# Patient Record
Sex: Female | Born: 1970 | Race: Black or African American | Hispanic: No | Marital: Married | Smoking: Never smoker
Health system: Southern US, Community
[De-identification: ages and names within clinical notes are randomized; demographics above are authoritative.]

## PROBLEM LIST (undated history)

## (undated) DIAGNOSIS — I10 Essential (primary) hypertension: Secondary | ICD-10-CM

## (undated) DIAGNOSIS — F419 Anxiety disorder, unspecified: Secondary | ICD-10-CM

## (undated) DIAGNOSIS — F411 Generalized anxiety disorder: Secondary | ICD-10-CM

## (undated) DIAGNOSIS — I6523 Occlusion and stenosis of bilateral carotid arteries: Secondary | ICD-10-CM

## (undated) DIAGNOSIS — E785 Hyperlipidemia, unspecified: Secondary | ICD-10-CM

## (undated) DIAGNOSIS — Z7901 Long term (current) use of anticoagulants: Secondary | ICD-10-CM

## (undated) DIAGNOSIS — Z9889 Other specified postprocedural states: Secondary | ICD-10-CM

## (undated) DIAGNOSIS — N939 Abnormal uterine and vaginal bleeding, unspecified: Secondary | ICD-10-CM

## (undated) DIAGNOSIS — K219 Gastro-esophageal reflux disease without esophagitis: Secondary | ICD-10-CM

## (undated) DIAGNOSIS — I675 Moyamoya disease: Secondary | ICD-10-CM

## (undated) DIAGNOSIS — D500 Iron deficiency anemia secondary to blood loss (chronic): Secondary | ICD-10-CM

## (undated) DIAGNOSIS — I639 Cerebral infarction, unspecified: Secondary | ICD-10-CM

## (undated) HISTORY — DX: Cerebral infarction, unspecified: I63.9

---

## 2018-10-02 DIAGNOSIS — H269 Unspecified cataract: Secondary | ICD-10-CM | POA: Insufficient documentation

## 2019-08-25 DIAGNOSIS — N951 Menopausal and female climacteric states: Secondary | ICD-10-CM | POA: Insufficient documentation

## 2020-02-05 ENCOUNTER — Inpatient Hospital Stay (HOSPITAL_COMMUNITY)
Admission: EM | Admit: 2020-02-05 | Discharge: 2020-02-08 | DRG: 065 | Disposition: A | Discharge status: Home / Self Care | Payer: BC Managed Care – PPO | Attending: Internal Medicine | Admitting: Internal Medicine

## 2020-02-05 ENCOUNTER — Emergency Department (HOSPITAL_COMMUNITY): Payer: BC Managed Care – PPO

## 2020-02-05 ENCOUNTER — Inpatient Hospital Stay (HOSPITAL_COMMUNITY): Payer: BC Managed Care – PPO

## 2020-02-05 ENCOUNTER — Encounter (HOSPITAL_COMMUNITY): Payer: Self-pay | Admitting: Emergency Medicine

## 2020-02-05 ENCOUNTER — Other Ambulatory Visit: Payer: Self-pay

## 2020-02-05 DIAGNOSIS — Z79899 Other long term (current) drug therapy: Secondary | ICD-10-CM | POA: Diagnosis not present

## 2020-02-05 DIAGNOSIS — F419 Anxiety disorder, unspecified: Secondary | ICD-10-CM | POA: Diagnosis present

## 2020-02-05 DIAGNOSIS — Z823 Family history of stroke: Secondary | ICD-10-CM

## 2020-02-05 DIAGNOSIS — R202 Paresthesia of skin: Secondary | ICD-10-CM

## 2020-02-05 DIAGNOSIS — I672 Cerebral atherosclerosis: Secondary | ICD-10-CM | POA: Diagnosis present

## 2020-02-05 DIAGNOSIS — Z20822 Contact with and (suspected) exposure to covid-19: Secondary | ICD-10-CM | POA: Diagnosis present

## 2020-02-05 DIAGNOSIS — R200 Anesthesia of skin: Secondary | ICD-10-CM | POA: Diagnosis present

## 2020-02-05 DIAGNOSIS — R29898 Other symptoms and signs involving the musculoskeletal system: Secondary | ICD-10-CM

## 2020-02-05 DIAGNOSIS — Z9181 History of falling: Secondary | ICD-10-CM

## 2020-02-05 DIAGNOSIS — Z8673 Personal history of transient ischemic attack (TIA), and cerebral infarction without residual deficits: Secondary | ICD-10-CM

## 2020-02-05 DIAGNOSIS — E871 Hypo-osmolality and hyponatremia: Secondary | ICD-10-CM | POA: Diagnosis present

## 2020-02-05 DIAGNOSIS — R29700 NIHSS score 0: Secondary | ICD-10-CM | POA: Diagnosis present

## 2020-02-05 DIAGNOSIS — I63521 Cerebral infarction due to unspecified occlusion or stenosis of right anterior cerebral artery: Principal | ICD-10-CM | POA: Diagnosis present

## 2020-02-05 DIAGNOSIS — Z833 Family history of diabetes mellitus: Secondary | ICD-10-CM

## 2020-02-05 DIAGNOSIS — I639 Cerebral infarction, unspecified: Secondary | ICD-10-CM | POA: Diagnosis present

## 2020-02-05 DIAGNOSIS — R4701 Aphasia: Secondary | ICD-10-CM | POA: Diagnosis present

## 2020-02-05 DIAGNOSIS — E785 Hyperlipidemia, unspecified: Secondary | ICD-10-CM | POA: Diagnosis present

## 2020-02-05 DIAGNOSIS — Z7982 Long term (current) use of aspirin: Secondary | ICD-10-CM

## 2020-02-05 DIAGNOSIS — I63512 Cerebral infarction due to unspecified occlusion or stenosis of left middle cerebral artery: Secondary | ICD-10-CM | POA: Diagnosis present

## 2020-02-05 DIAGNOSIS — R590 Localized enlarged lymph nodes: Secondary | ICD-10-CM | POA: Diagnosis present

## 2020-02-05 DIAGNOSIS — I63513 Cerebral infarction due to unspecified occlusion or stenosis of bilateral middle cerebral arteries: Secondary | ICD-10-CM | POA: Diagnosis not present

## 2020-02-05 DIAGNOSIS — I675 Moyamoya disease: Secondary | ICD-10-CM | POA: Diagnosis present

## 2020-02-05 DIAGNOSIS — I63423 Cerebral infarction due to embolism of bilateral anterior cerebral arteries: Secondary | ICD-10-CM | POA: Diagnosis not present

## 2020-02-05 DIAGNOSIS — Z793 Long term (current) use of hormonal contraceptives: Secondary | ICD-10-CM | POA: Diagnosis not present

## 2020-02-05 DIAGNOSIS — G8191 Hemiplegia, unspecified affecting right dominant side: Secondary | ICD-10-CM | POA: Diagnosis present

## 2020-02-05 DIAGNOSIS — I6389 Other cerebral infarction: Secondary | ICD-10-CM | POA: Diagnosis not present

## 2020-02-05 DIAGNOSIS — M25551 Pain in right hip: Secondary | ICD-10-CM | POA: Diagnosis present

## 2020-02-05 DIAGNOSIS — I63232 Cerebral infarction due to unspecified occlusion or stenosis of left carotid arteries: Secondary | ICD-10-CM | POA: Diagnosis not present

## 2020-02-05 DIAGNOSIS — I10 Essential (primary) hypertension: Secondary | ICD-10-CM | POA: Diagnosis present

## 2020-02-05 DIAGNOSIS — R531 Weakness: Secondary | ICD-10-CM | POA: Diagnosis present

## 2020-02-05 DIAGNOSIS — R002 Palpitations: Secondary | ICD-10-CM | POA: Diagnosis present

## 2020-02-05 HISTORY — DX: Personal history of transient ischemic attack (TIA), and cerebral infarction without residual deficits: Z86.73

## 2020-02-05 HISTORY — DX: Essential (primary) hypertension: I10

## 2020-02-05 LAB — COMPREHENSIVE METABOLIC PANEL
ALT: 18 U/L (ref 0–44)
AST: 17 U/L (ref 15–41)
Albumin: 3.2 g/dL — ABNORMAL LOW (ref 3.5–5.0)
Alkaline Phosphatase: 84 U/L (ref 38–126)
Anion gap: 9 (ref 5–15)
BUN: 18 mg/dL (ref 6–20)
CO2: 27 mmol/L (ref 22–32)
Calcium: 8.5 mg/dL — ABNORMAL LOW (ref 8.9–10.3)
Chloride: 97 mmol/L — ABNORMAL LOW (ref 98–111)
Creatinine, Ser: 1.12 mg/dL — ABNORMAL HIGH (ref 0.44–1.00)
GFR calc Af Amer: >60 mL/min (ref >60)
GFR calc non Af Amer: 57 mL/min — ABNORMAL LOW (ref >60)
Glucose, Bld: 89 mg/dL (ref 70–99)
Potassium: 4.4 mmol/L (ref 3.5–5.1)
Sodium: 133 mmol/L — ABNORMAL LOW (ref 135–145)
Total Bilirubin: 1 mg/dL (ref 0.3–1.2)
Total Protein: 6.6 g/dL (ref 6.5–8.1)

## 2020-02-05 LAB — I-STAT CHEM 8, ED
BUN: 21 mg/dL — ABNORMAL HIGH (ref 6–20)
Calcium, Ion: 1.09 mmol/L — ABNORMAL LOW (ref 1.15–1.40)
Chloride: 99 mmol/L (ref 98–111)
Creatinine, Ser: 1.1 mg/dL — ABNORMAL HIGH (ref 0.44–1.00)
Glucose, Bld: 83 mg/dL (ref 70–99)
HCT: 43 % (ref 36.0–46.0)
Hemoglobin: 14.6 g/dL (ref 12.0–15.0)
Potassium: 4.4 mmol/L (ref 3.5–5.1)
Sodium: 136 mmol/L (ref 135–145)
TCO2: 30 mmol/L (ref 22–32)

## 2020-02-05 LAB — CBC
HCT: 42.9 % (ref 36.0–46.0)
Hemoglobin: 14.5 g/dL (ref 12.0–15.0)
MCH: 30.9 pg (ref 26.0–34.0)
MCHC: 33.8 g/dL (ref 30.0–36.0)
MCV: 91.3 fL (ref 80.0–100.0)
Platelets: 301 10*3/uL (ref 150–400)
RBC: 4.7 MIL/uL (ref 3.87–5.11)
RDW: 13.5 % (ref 11.5–15.5)
WBC: 10 10*3/uL (ref 4.0–10.5)
nRBC: 0 % (ref 0.0–0.2)

## 2020-02-05 LAB — DIFFERENTIAL
Abs Immature Granulocytes: 0.04 10*3/uL (ref 0.00–0.07)
Basophils Absolute: 0 10*3/uL (ref 0.0–0.1)
Basophils Relative: 0 %
Eosinophils Absolute: 0.2 10*3/uL (ref 0.0–0.5)
Eosinophils Relative: 2 %
Immature Granulocytes: 0 %
Lymphocytes Relative: 19 %
Lymphs Abs: 1.9 10*3/uL (ref 0.7–4.0)
Monocytes Absolute: 0.8 10*3/uL (ref 0.1–1.0)
Monocytes Relative: 8 %
Neutro Abs: 7 10*3/uL (ref 1.7–7.7)
Neutrophils Relative %: 71 %

## 2020-02-05 LAB — APTT: aPTT: 26 seconds (ref 24–36)

## 2020-02-05 LAB — I-STAT BETA HCG BLOOD, ED (MC, WL, AP ONLY): I-stat hCG, quantitative: <5 m[IU]/mL (ref <5)

## 2020-02-05 LAB — PROTIME-INR
INR: 1 (ref 0.8–1.2)
Prothrombin Time: 12.5 seconds (ref 11.4–15.2)

## 2020-02-05 IMAGING — CR DG HIP (WITH OR WITHOUT PELVIS) 2-3V*R*
3 series · 3 of 3 positions shown · non-contrast
Comparison: None.

CLINICAL DATA: Fall 1 week ago with persistent right hip pain,
initial encounter

EXAM:
DG HIP (WITH OR WITHOUT PELVIS) 2-3V RIGHT

[pelvis ap]
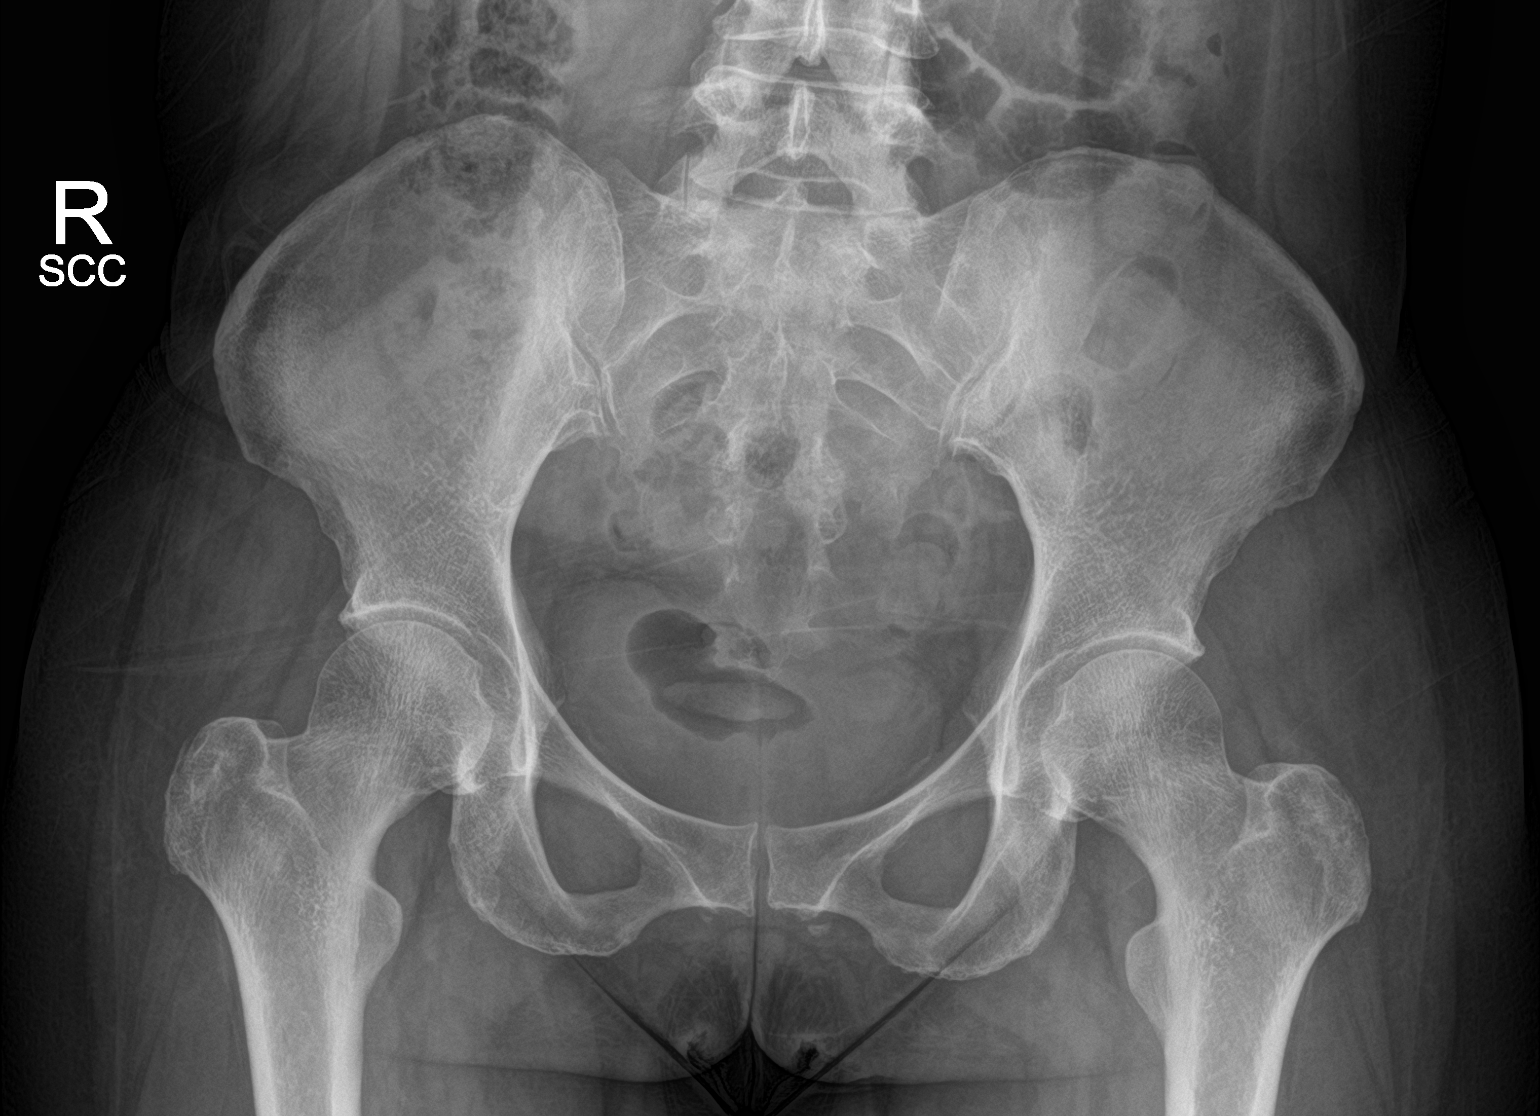

[hip ap]
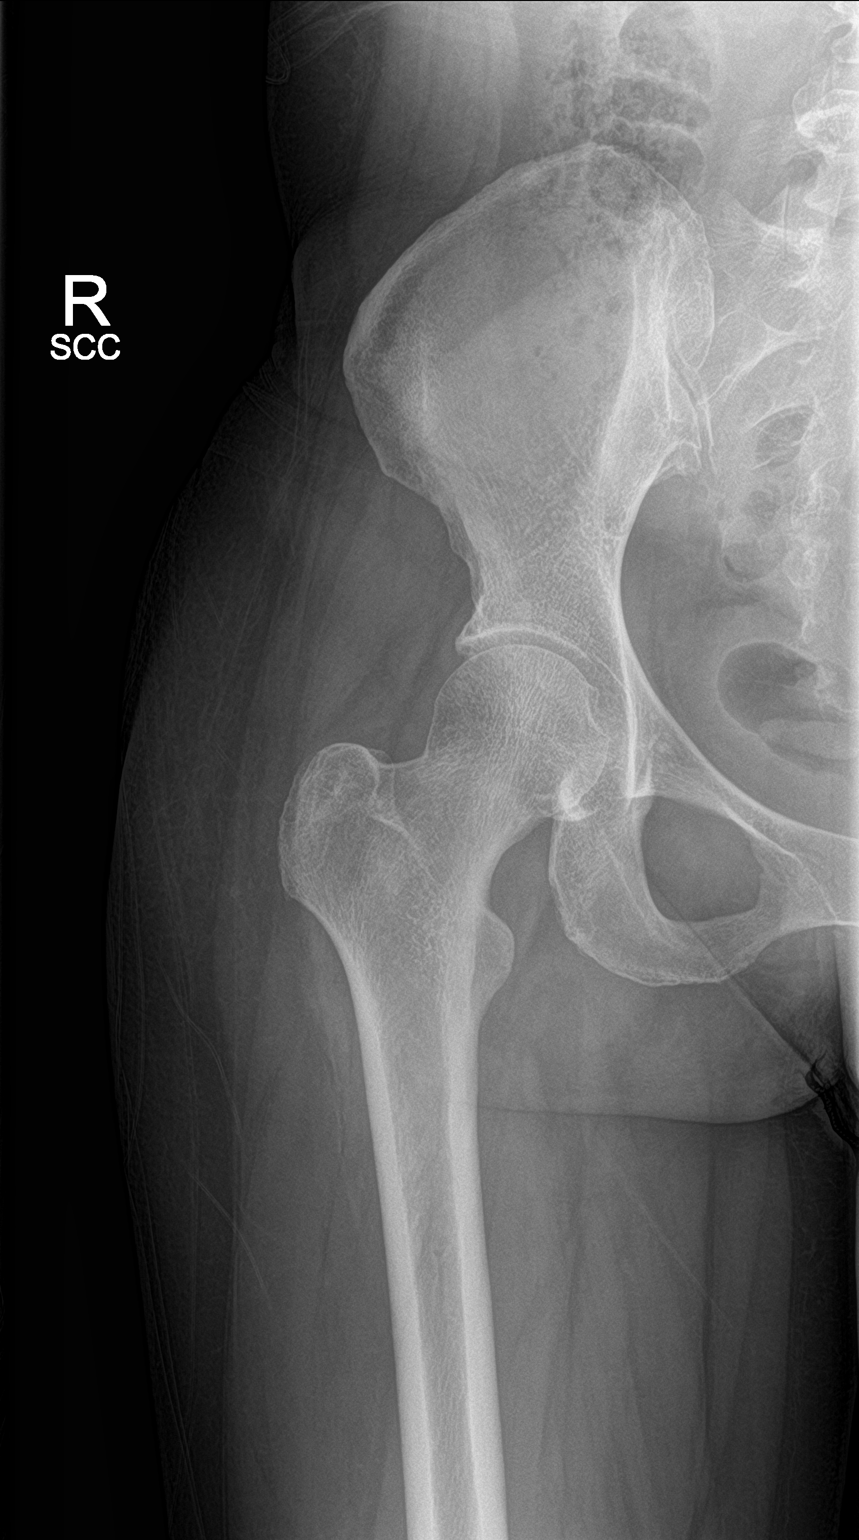

[hip lat]
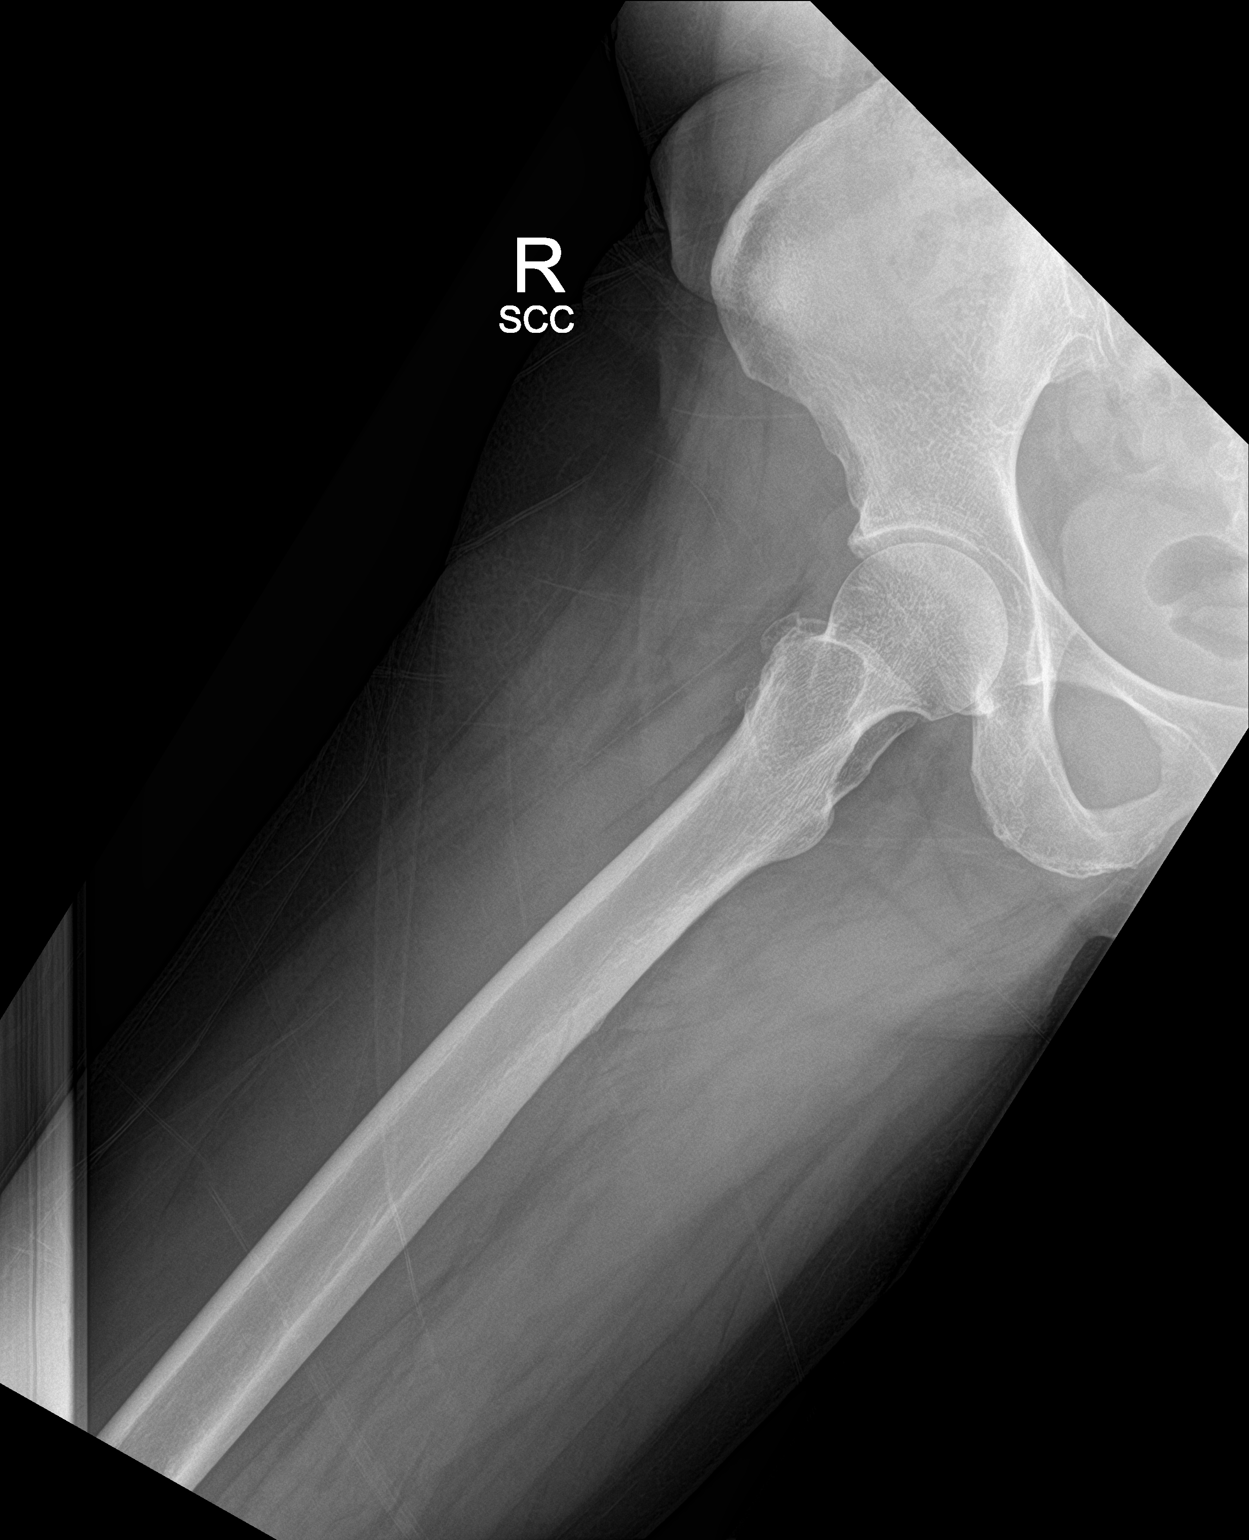

[3 of 3 positions shown; findings below may reference images not displayed]

FINDINGS: Pelvic ring is intact. Mild degenerative changes of the hip joints
are noted bilaterally. No acute fracture or dislocation is seen. No
soft tissue abnormality is noted.
IMPRESSION: Degenerative change without acute abnormality.

## 2020-02-05 IMAGING — MR MR HEAD W/O CM
9 of 13 series · 26 of 48 positions shown · non-contrast
Comparison: Head CT [DATE]

CLINICAL DATA: Mild aphasia. Transient right arm and leg weakness
and left face numbness.

EXAM:
MRI HEAD WITHOUT CONTRAST
MRA HEAD WITHOUT CONTRAST
TECHNIQUE: Multiplanar, multiecho pulse sequences of the brain and surrounding
structures were obtained without intravenous contrast. Angiographic
images of the head were obtained using MRA technique without
contrast.

[Series 4: DWI · axial · 3.0mm · 0.94mm/px · z∈[-135,+10]mm · 6 of 104 slices shown (1 of 2)]
[im 1/104]
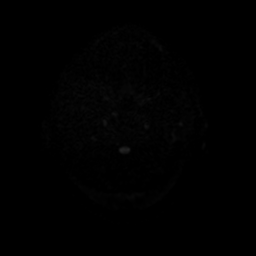
[im 21/104]
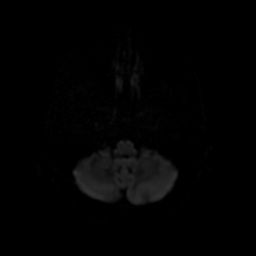
[im 42/104]
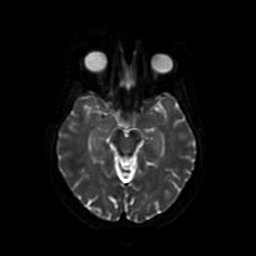
[im 62/104]
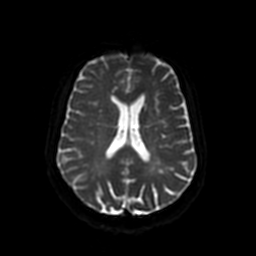
[im 83/104]
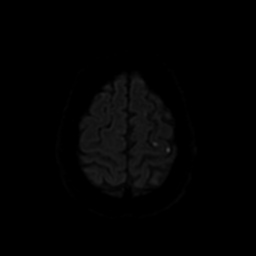
[im 104/104]
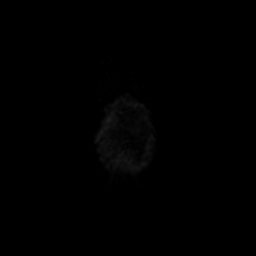

[Series 5: FLAIR · sagittal · 5.0mm · 0.23mm/px · 1 of 24 slices shown (1 of 2)]
[im 1/24]
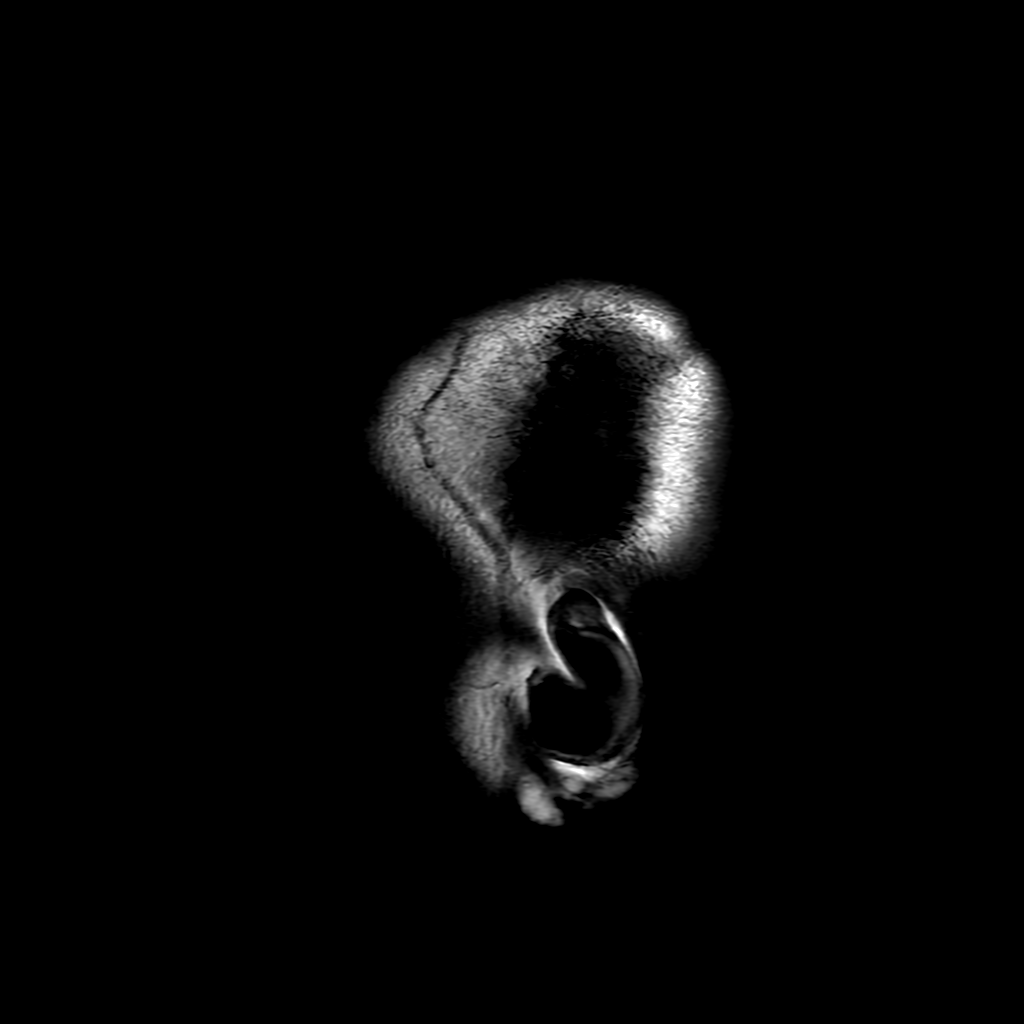

[Series 6: ax (id) · axial · 1.0mm · 0.43mm/px · z∈[-119,-46]mm · 6 of 206 slices shown]
[im 1/206]
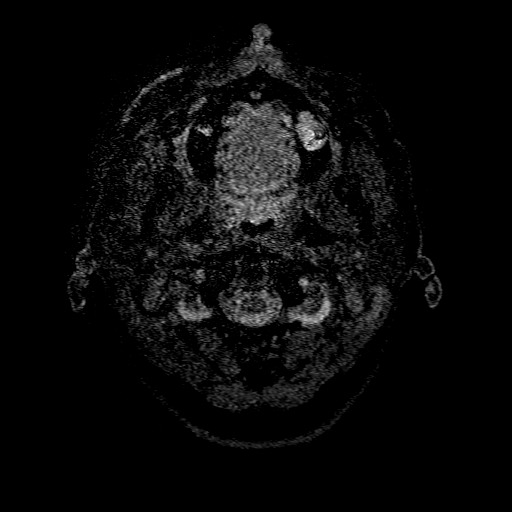
[im 38/206]
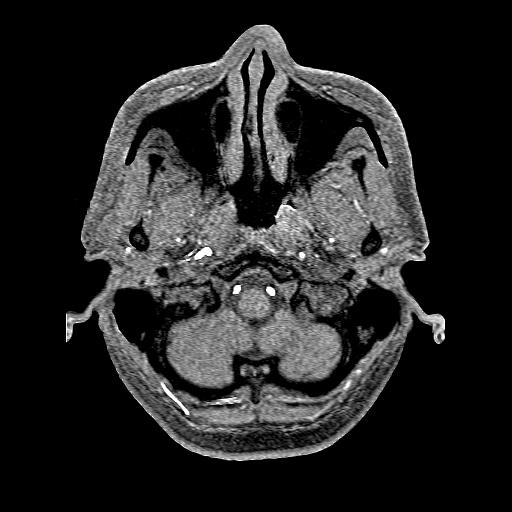
[im 56/206]
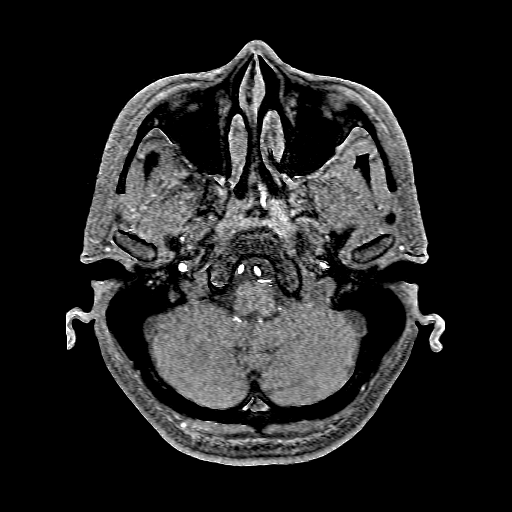
[im 94/206]
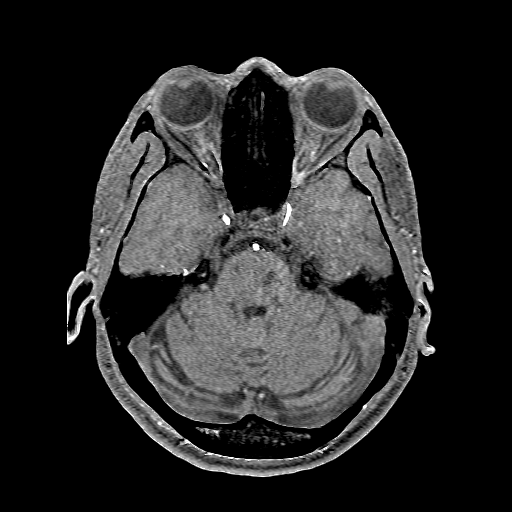
[im 112/206]
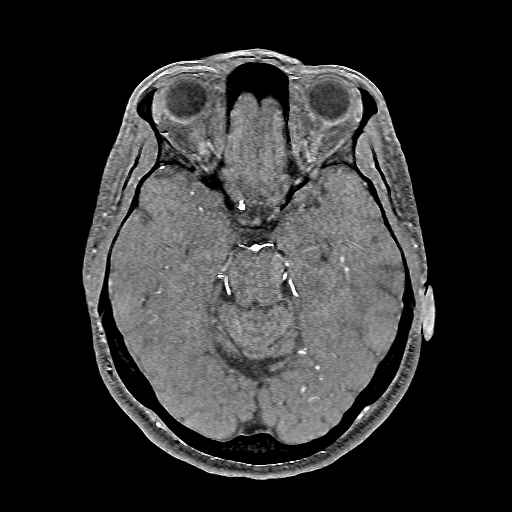
[im 150/206]
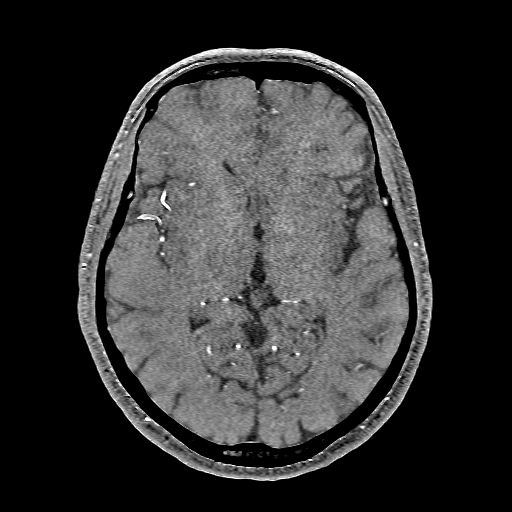

[Series 7: FLAIR · axial · 3.0mm · 0.41mm/px · 1 of 26 slices shown (2 of 2)]
[im 1/26]
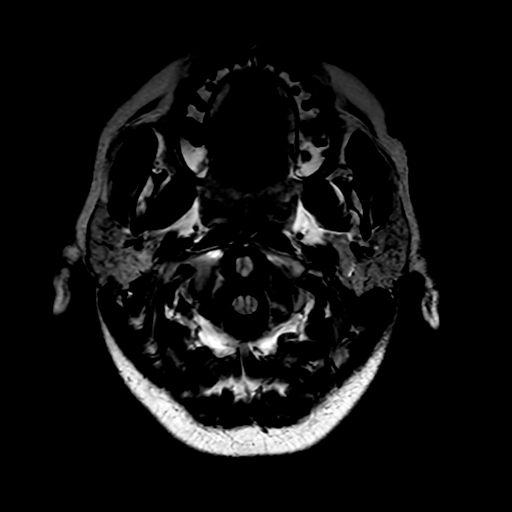

[Series 9: DWI · coronal · 4.0mm · 0.94mm/px · 4 of 70 slices shown (2 of 2)]
[im 1/70]
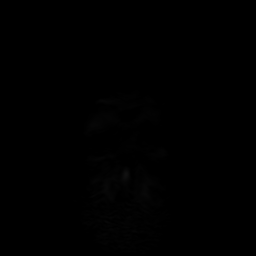
[im 24/70]
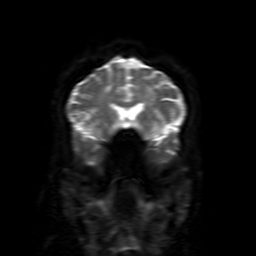
[im 47/70]
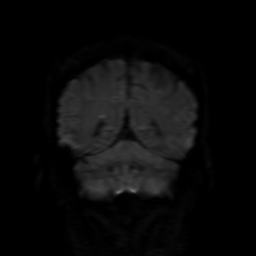
[im 70/70]
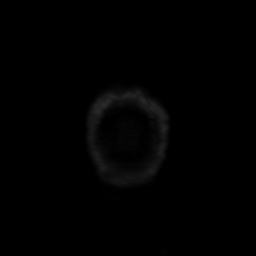

[Series 10: T2 · axial · 5.0mm · 0.47mm/px · 1 of 26 slices shown (1 of 2)]
[im 1/26]
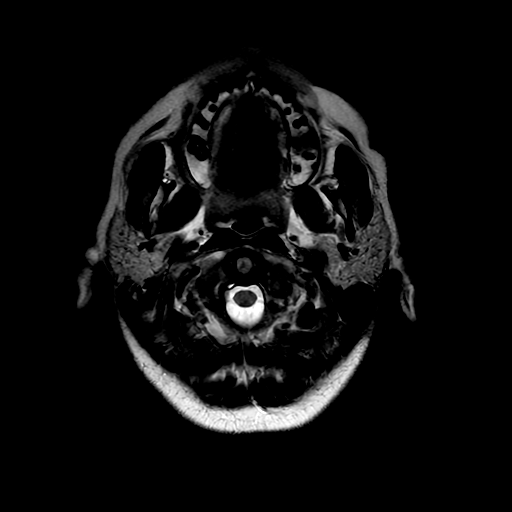

[Series 12: T2 · coronal · 5.0mm · 0.39mm/px · 2 of 29 slices shown (2 of 2)]
[im 1/29]
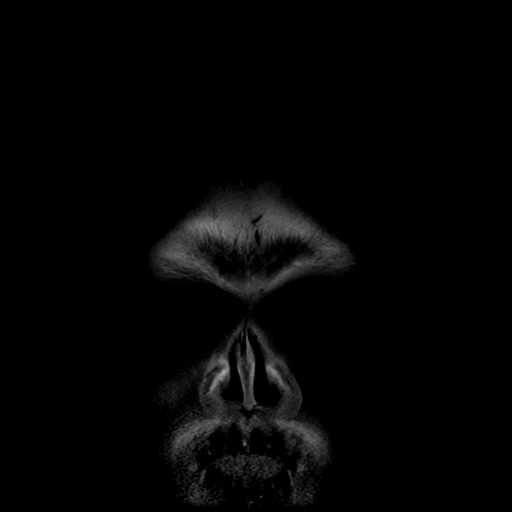
[im 29/29]
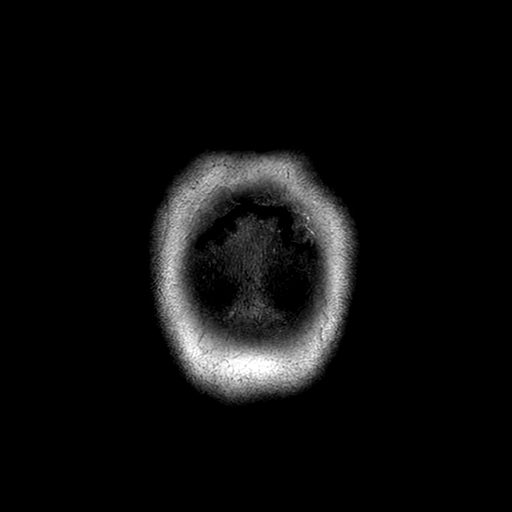

[Series 450: ADC · axial · 3.0mm · 0.94mm/px · z∈[-135,+10]mm · 3 of 52 slices shown (1 of 2)]
[im 1/52]
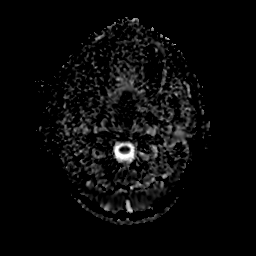
[im 26/52]
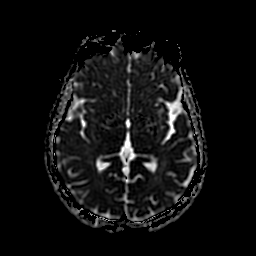
[im 52/52]
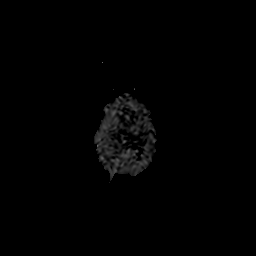

[Series 950: ADC · coronal · 4.0mm · 0.94mm/px · 2 of 35 slices shown (2 of 2)]
[im 1/35]
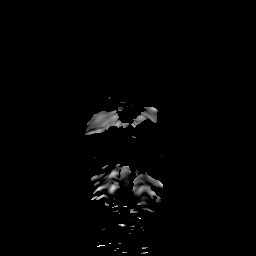
[im 35/35]
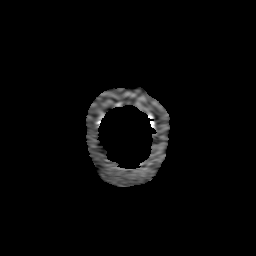

[26 of 48 positions shown; findings below may reference images not displayed]

FINDINGS: MRI HEAD FINDINGS

Brain: There are multiple punctate acute infarcts involving medial
right frontal lobe cortex and subcortical white matter (ACA
territory), right periatrial white matter, and left precentral
gyrus. Additionally, there is a more confluent region of milder
diffusion weighted signal abnormality in the left frontal lobe
involving cortex and white matter measuring approximately 3 cm in
size with T2 hyperintensity, facilitated diffusion, and petechial
hemorrhage compatible with a subacute infarct and corresponding to
the abnormality on CT.

T2 hyperintensities elsewhere in the cerebral white matter
bilaterally are nonspecific but compatible with moderately age
advanced chronic small vessel ischemic disease. There is also a late
subacute to chronic left basal ganglia infarct with chronic blood
products. The ventricles and sulci are normal. No mass, midline
shift, or extra-axial fluid collection is identified.

Vascular: Abnormal appearance of the left ICA, more fully evaluated
below.

Skull and upper cervical spine: Unremarkable bone marrow signal.

Sinuses/Orbits: Unremarkable orbits. Paranasal sinuses and mastoid
air cells are clear.

Other: None.

MRA HEAD FINDINGS

The visualized distal to the basilar and codominant vertebral
arteries are widely patent. Patent PICAs, AICAs, and SCAs are seen
bilaterally. The basilar artery is widely patent. There are medium
sized posterior communicating arteries bilaterally with diminished
or absent flow related enhancement proximally on the left. The PCAs
are patent without evidence of significant proximal stenosis.

The intracranial right internal carotid artery is patent with severe
tandem stenosis involving the distal supraclinoid segment and
terminus. The intracranial left ICA is patent proximally but small
and is occluded from the proximal supraclinoid segment through the
terminus. The left MCA and both ACAs are occluded. The right MCA is
patent with a severe stenosis at its origin. No aneurysm is
identified.
IMPRESSION: 1. Multiple small acute infarcts in the right ACA territory and left
precentral gyrus.
2. Subacute left frontal infarct.
3. Moderately extensive chronic small vessel ischemic disease.
4. Occlusion of the distal left ICA, left MCA, and both ACAs with
severe tandem stenoses of the distal right ICA (Moyamoya pattern).

## 2020-02-05 IMAGING — MR MR MRA HEAD W/O CM
9 of 13 series · 26 of 48 positions shown · non-contrast
Comparison: Head CT [DATE]

CLINICAL DATA: Mild aphasia. Transient right arm and leg weakness
and left face numbness.

EXAM:
MRI HEAD WITHOUT CONTRAST
MRA HEAD WITHOUT CONTRAST
TECHNIQUE: Multiplanar, multiecho pulse sequences of the brain and surrounding
structures were obtained without intravenous contrast. Angiographic
images of the head were obtained using MRA technique without
contrast.

[Series 4: DWI · axial · 3.0mm · 0.94mm/px · z∈[-135,+10]mm · 6 of 104 slices shown (1 of 2)]
[im 1/104]
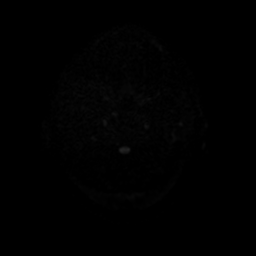
[im 21/104]
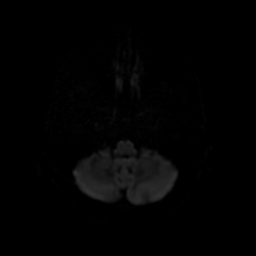
[im 42/104]
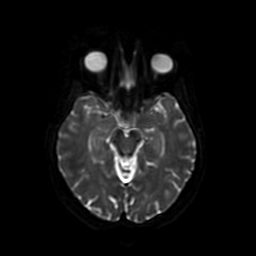
[im 62/104]
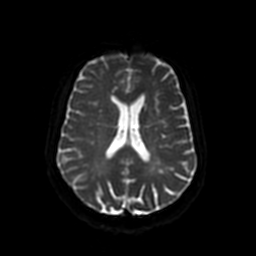
[im 83/104]
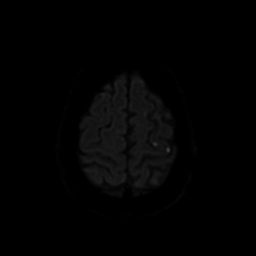
[im 104/104]
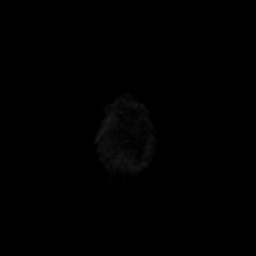

[Series 5: FLAIR · sagittal · 5.0mm · 0.23mm/px · 1 of 24 slices shown (1 of 2)]
[im 1/24]
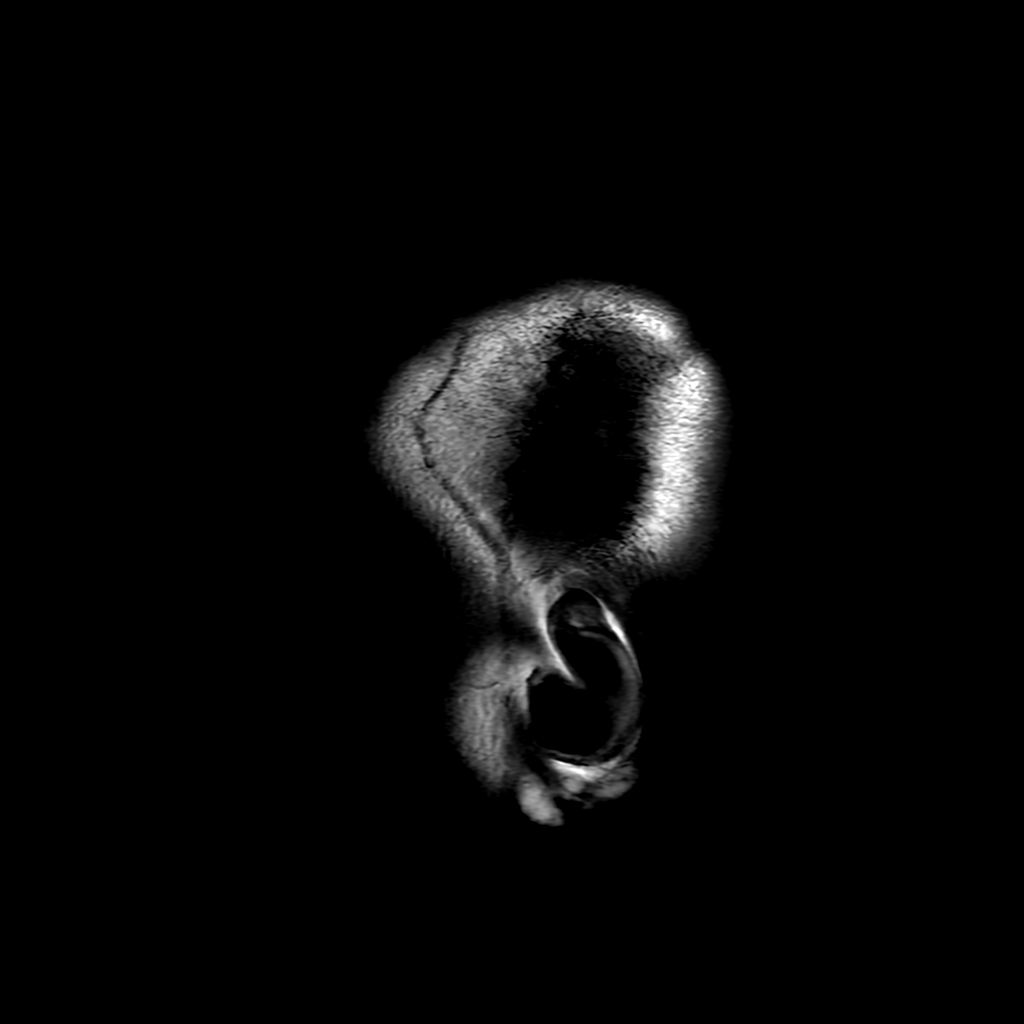

[Series 6: ax (id) · axial · 1.0mm · 0.43mm/px · z∈[-119,-46]mm · 6 of 206 slices shown]
[im 1/206]
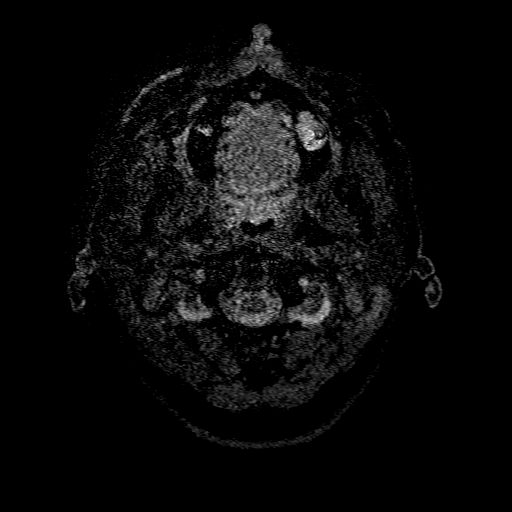
[im 38/206]
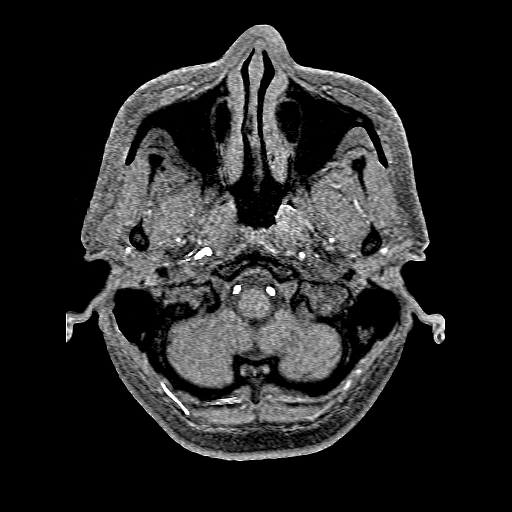
[im 56/206]
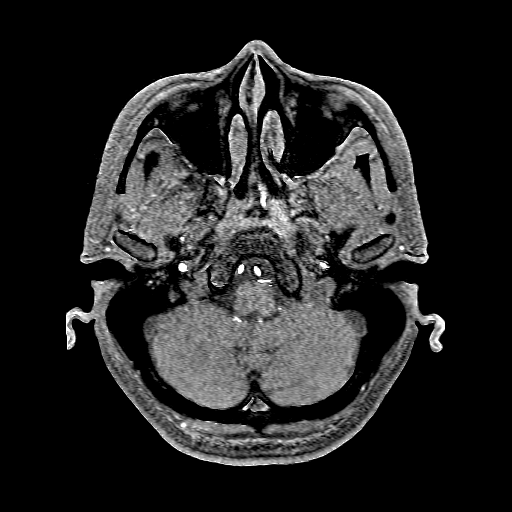
[im 94/206]
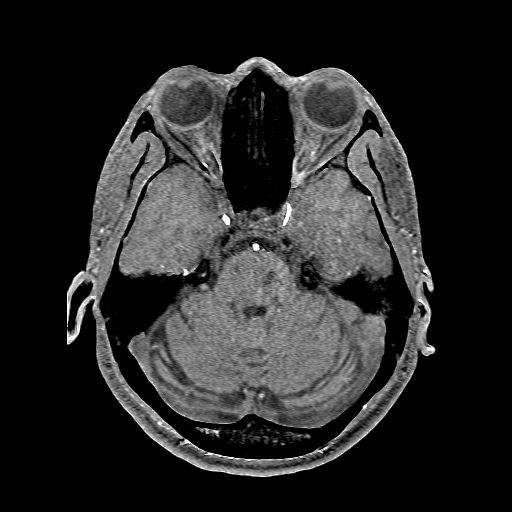
[im 112/206]
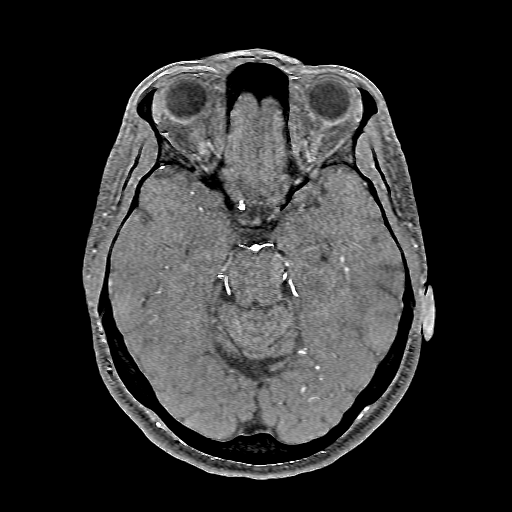
[im 150/206]
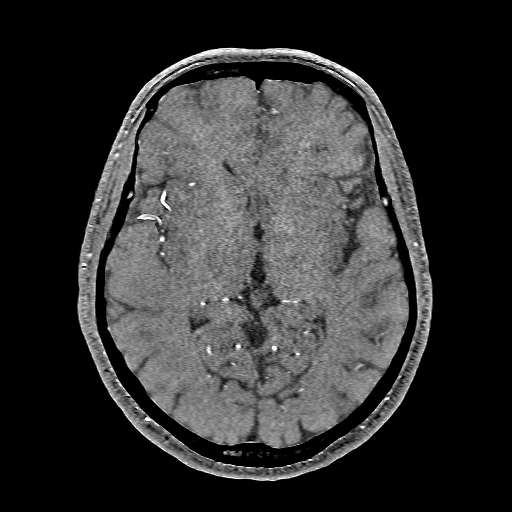

[Series 7: FLAIR · axial · 3.0mm · 0.41mm/px · 1 of 26 slices shown (2 of 2)]
[im 1/26]
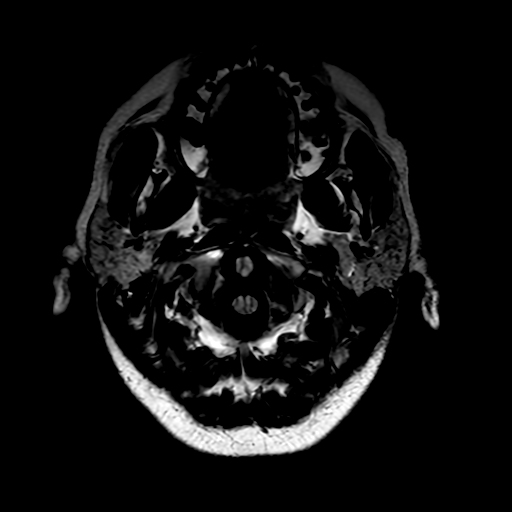

[Series 9: DWI · coronal · 4.0mm · 0.94mm/px · 4 of 70 slices shown (2 of 2)]
[im 1/70]
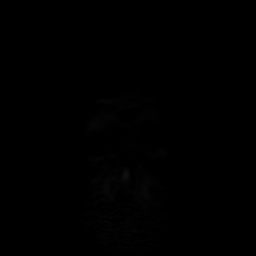
[im 24/70]
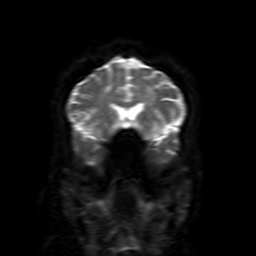
[im 47/70]
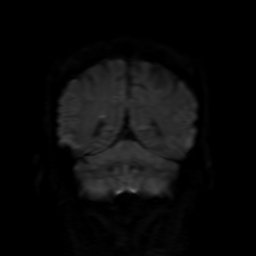
[im 70/70]
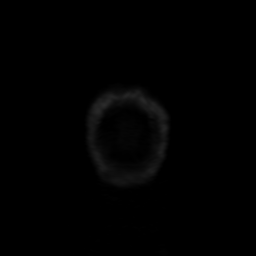

[Series 10: T2 · axial · 5.0mm · 0.47mm/px · 1 of 26 slices shown (1 of 2)]
[im 1/26]
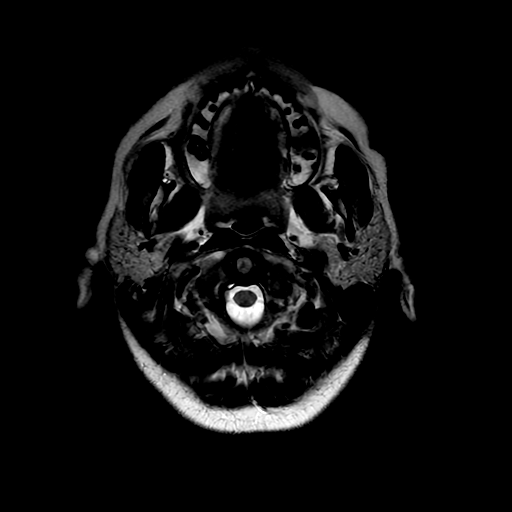

[Series 12: T2 · coronal · 5.0mm · 0.39mm/px · 2 of 29 slices shown (2 of 2)]
[im 1/29]
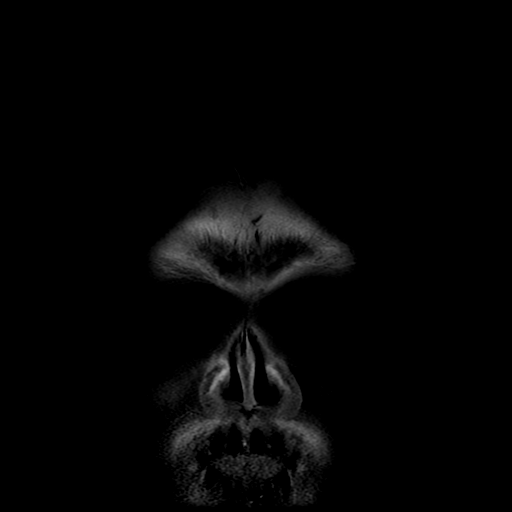
[im 29/29]
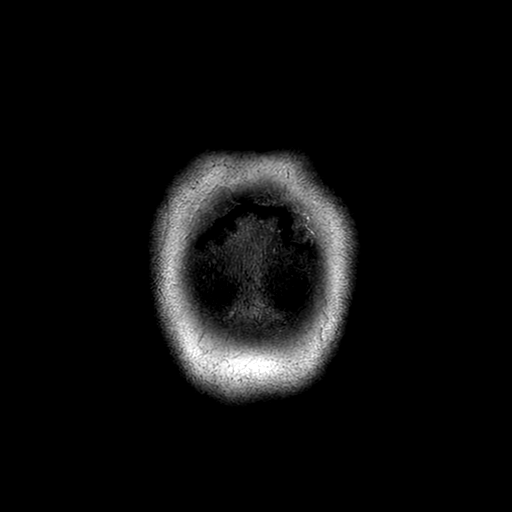

[Series 450: ADC · axial · 3.0mm · 0.94mm/px · z∈[-135,+10]mm · 3 of 52 slices shown (1 of 2)]
[im 1/52]
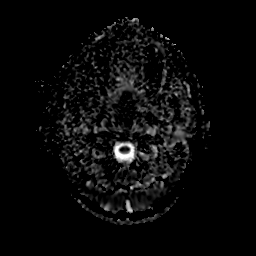
[im 26/52]
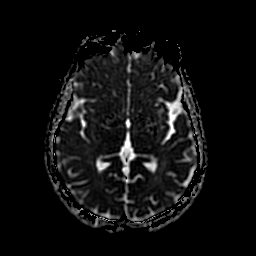
[im 52/52]
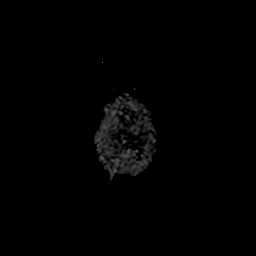

[Series 950: ADC · coronal · 4.0mm · 0.94mm/px · 2 of 35 slices shown (2 of 2)]
[im 1/35]
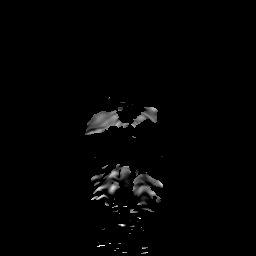
[im 35/35]
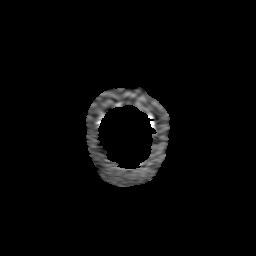

[26 of 48 positions shown; findings below may reference images not displayed]

FINDINGS: MRI HEAD FINDINGS

Brain: There are multiple punctate acute infarcts involving medial
right frontal lobe cortex and subcortical white matter (ACA
territory), right periatrial white matter, and left precentral
gyrus. Additionally, there is a more confluent region of milder
diffusion weighted signal abnormality in the left frontal lobe
involving cortex and white matter measuring approximately 3 cm in
size with T2 hyperintensity, facilitated diffusion, and petechial
hemorrhage compatible with a subacute infarct and corresponding to
the abnormality on CT.

T2 hyperintensities elsewhere in the cerebral white matter
bilaterally are nonspecific but compatible with moderately age
advanced chronic small vessel ischemic disease. There is also a late
subacute to chronic left basal ganglia infarct with chronic blood
products. The ventricles and sulci are normal. No mass, midline
shift, or extra-axial fluid collection is identified.

Vascular: Abnormal appearance of the left ICA, more fully evaluated
below.

Skull and upper cervical spine: Unremarkable bone marrow signal.

Sinuses/Orbits: Unremarkable orbits. Paranasal sinuses and mastoid
air cells are clear.

Other: None.

MRA HEAD FINDINGS

The visualized distal to the basilar and codominant vertebral
arteries are widely patent. Patent PICAs, AICAs, and SCAs are seen
bilaterally. The basilar artery is widely patent. There are medium
sized posterior communicating arteries bilaterally with diminished
or absent flow related enhancement proximally on the left. The PCAs
are patent without evidence of significant proximal stenosis.

The intracranial right internal carotid artery is patent with severe
tandem stenosis involving the distal supraclinoid segment and
terminus. The intracranial left ICA is patent proximally but small
and is occluded from the proximal supraclinoid segment through the
terminus. The left MCA and both ACAs are occluded. The right MCA is
patent with a severe stenosis at its origin. No aneurysm is
identified.
IMPRESSION: 1. Multiple small acute infarcts in the right ACA territory and left
precentral gyrus.
2. Subacute left frontal infarct.
3. Moderately extensive chronic small vessel ischemic disease.
4. Occlusion of the distal left ICA, left MCA, and both ACAs with
severe tandem stenoses of the distal right ICA (Moyamoya pattern).

## 2020-02-05 IMAGING — CT CT HEAD W/O CM
4 series · 16 of 47 positions shown, 18 images · non-contrast
Comparison: None.
COMPARISON: None.

Addendum:
CLINICAL DATA: Right hand weakness for 2 days, initial encounter

EXAM:
CT HEAD WITHOUT CONTRAST
TECHNIQUE: Contiguous axial images were obtained from the base of the skull
through the vertex without intravenous contrast.

[Series 3: head wo · axial · 0.39mm/px · z∈[-140,-30]mm · 7 of 30 slices shown, 9 images]
[im 4/30  brain]
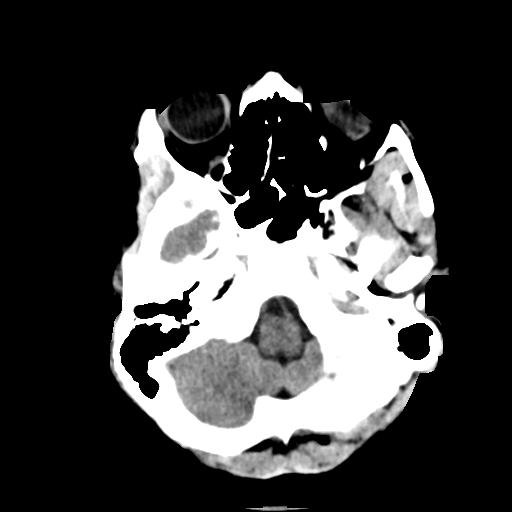
[im 4/30  bone]
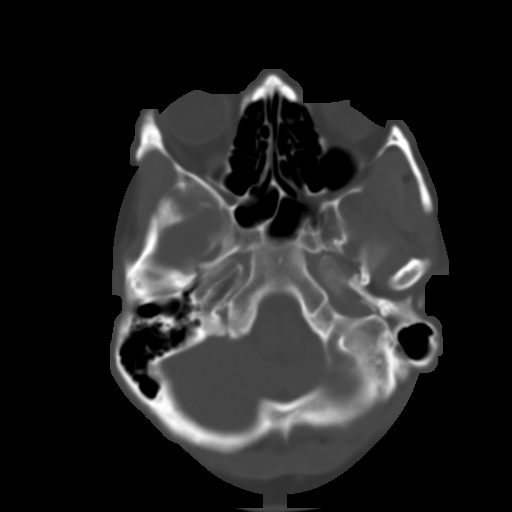
[im 8/30  brain]
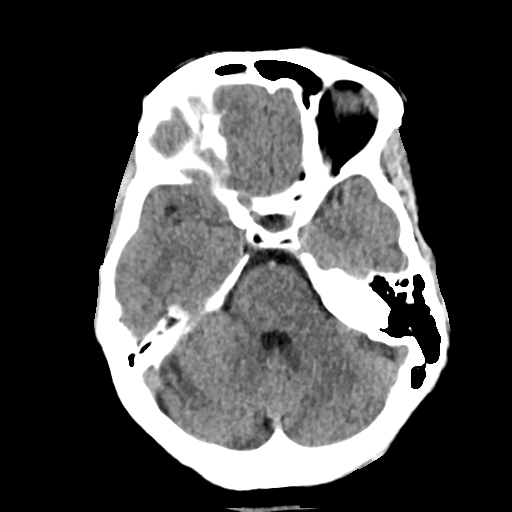
[im 11/30  brain]
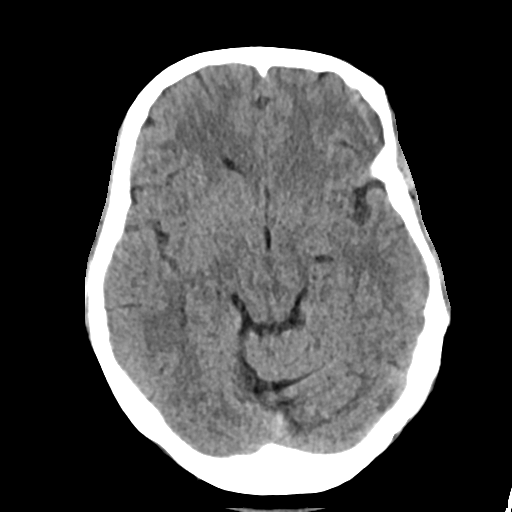
[im 15/30  brain]
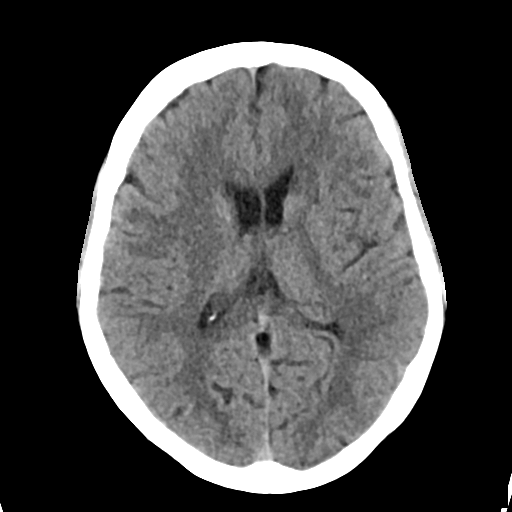
[im 19/30  brain]
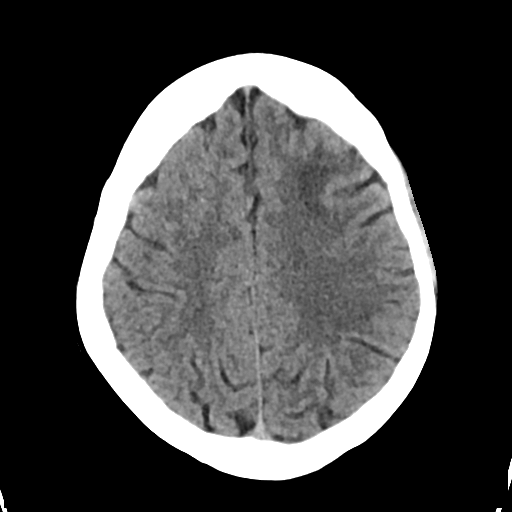
[im 19/30  bone]
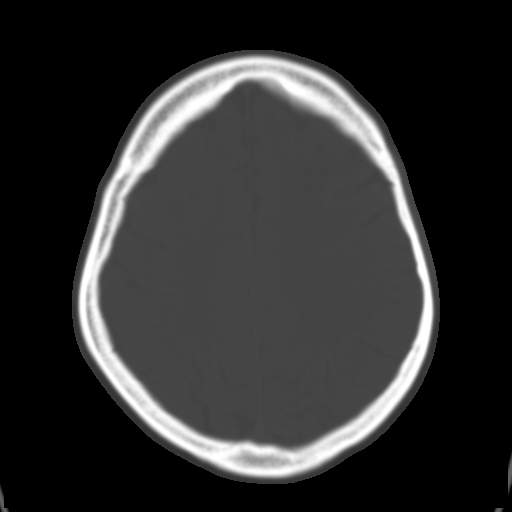
[im 22/30  brain]
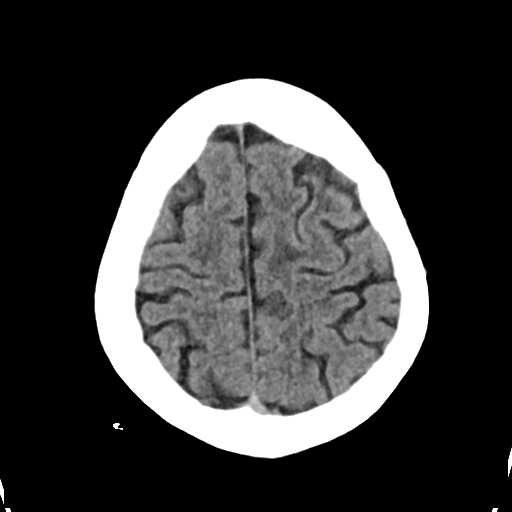
[im 26/30  brain]
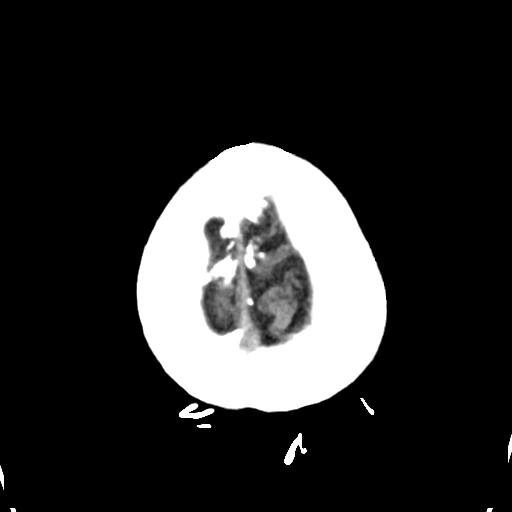

[Series 4: head bone · axial · 0.39mm/px · z∈[-142,-114]mm · 3 of 73 slices shown]
[im 8/73  bone]
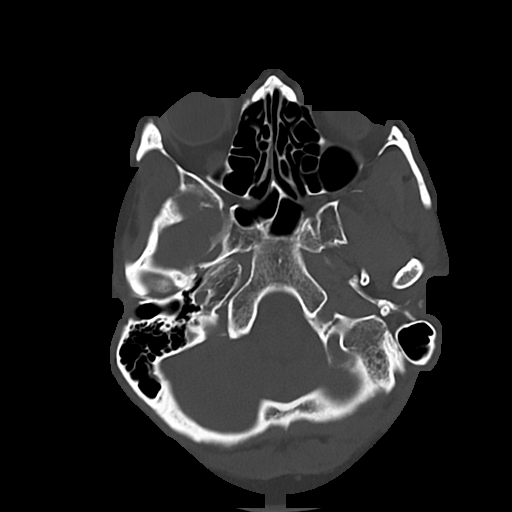
[im 15/73  bone]
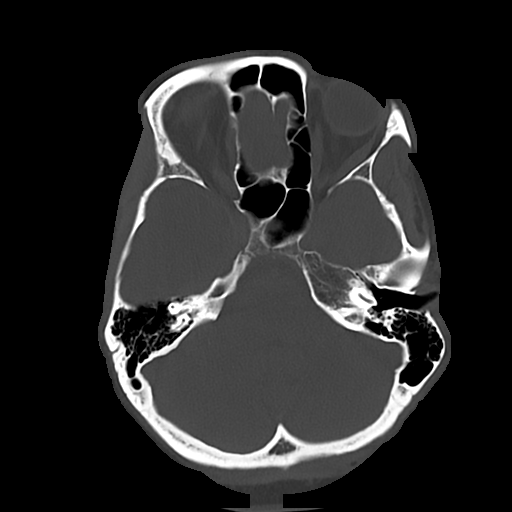
[im 22/73  bone]
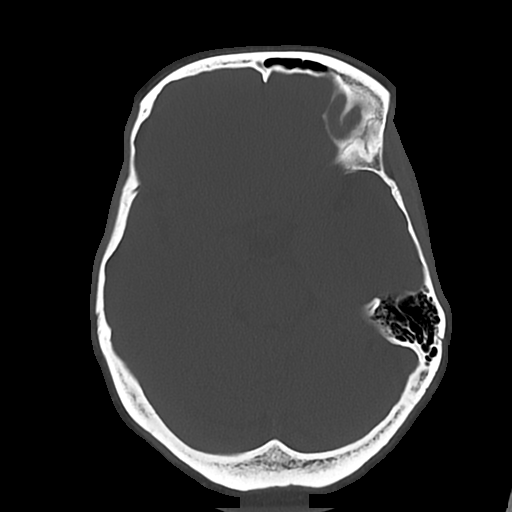

[Series 5: cor soft · coronal · 0.31mm/px · 3 of 62 slices shown]
[im 21/62  brain]
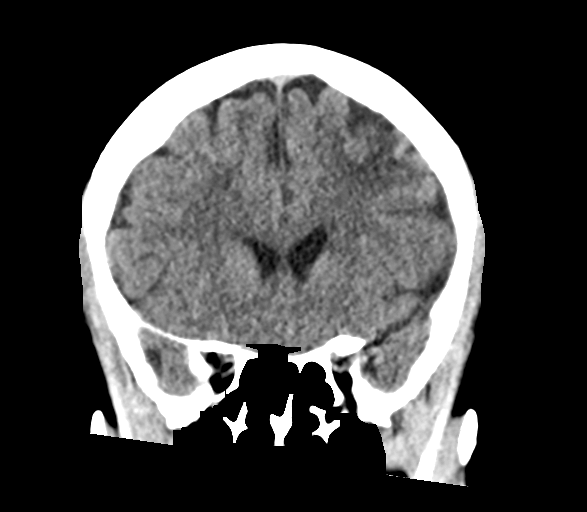
[im 28/62  brain]
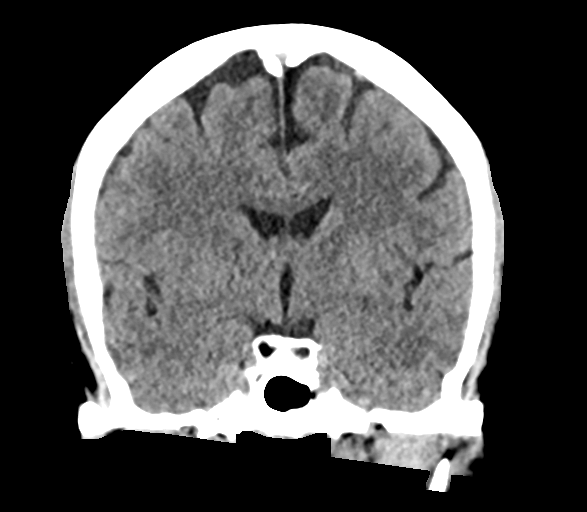
[im 34/62  brain]
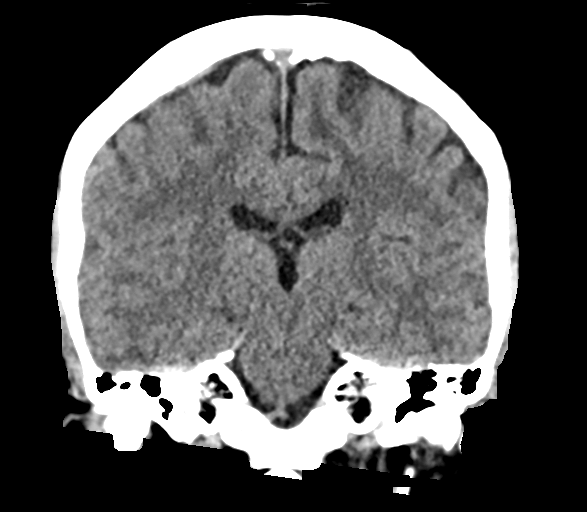

[Series 6: sag soft · sagittal · 0.31mm/px · 3 of 60 slices shown]
[im 22/60  brain]
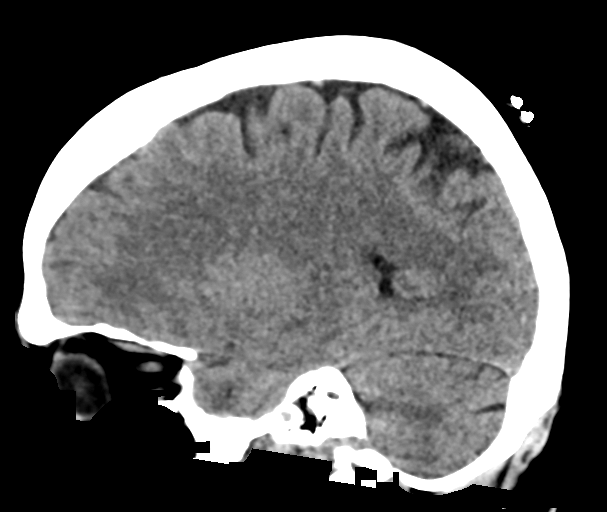
[im 30/60  brain]
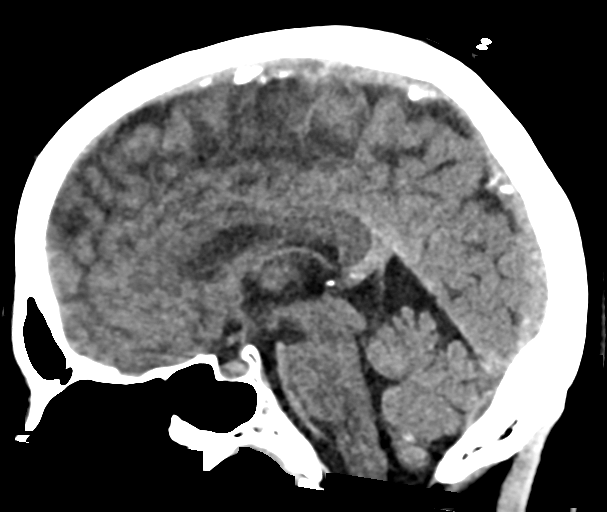
[im 38/60  brain]
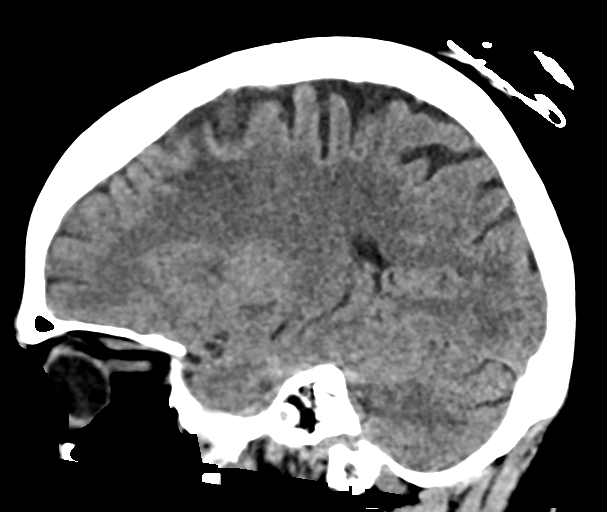

[16 of 47 positions shown; findings below may reference images not displayed]

FINDINGS: Brain: There is an area of geographic decreased attenuation
identified in the right frontal lobe near the vertex consistent with
subacute ischemia. No other focal infarct is seen. No hemorrhage or
mass lesion is noted.

Vascular: No hyperdense vessel or unexpected calcification.

Skull: Normal. Negative for fracture or focal lesion.

Sinuses/Orbits: No acute finding.

Other: None.
IMPRESSION: Area of decreased attenuation in the right frontal lobe consistent
with subacute ischemia.

No other focal abnormality is noted.

ADDENDUM:
The findings and impression should read left frontal lobe not right.

*** End of Addendum ***
FINDINGS: Brain: There is an area of geographic decreased attenuation
identified in the right frontal lobe near the vertex consistent with
subacute ischemia. No other focal infarct is seen. No hemorrhage or
mass lesion is noted.

Vascular: No hyperdense vessel or unexpected calcification.

Skull: Normal. Negative for fracture or focal lesion.

Sinuses/Orbits: No acute finding.

Other: None.
IMPRESSION: Area of decreased attenuation in the right frontal lobe consistent
with subacute ischemia.

No other focal abnormality is noted.

## 2020-02-05 MED ORDER — SODIUM CHLORIDE 0.9 % IV SOLN
INTRAVENOUS | Status: DC | PRN
  Administered 2020-02-05: 250 mL via INTRAVENOUS

## 2020-02-05 MED ORDER — SODIUM CHLORIDE 0.9% FLUSH: 3.0000 mL | Freq: Once | INTRAVENOUS | Status: DC

## 2020-02-05 MED ORDER — ACETAMINOPHEN 160 MG/5ML PO SOLN: 650.0000 mg | ORAL | Status: DC | PRN

## 2020-02-05 MED ORDER — CYCLOBENZAPRINE HCL 10 MG PO TABS: 10.0000 mg | ORAL_TABLET | Freq: Three times a day (TID) | ORAL | Status: DC | PRN

## 2020-02-05 MED ORDER — ASPIRIN 325 MG PO TABS
325.0000 mg | ORAL_TABLET | Freq: Every day | ORAL | Status: DC
  Administered 2020-02-05 – 2020-02-08 (×4): 325 mg via ORAL
  Filled 2020-02-05 (×4): qty 1

## 2020-02-05 MED ORDER — SENNOSIDES-DOCUSATE SODIUM 8.6-50 MG PO TABS: 1.0000 | ORAL_TABLET | Freq: Every evening | ORAL | Status: DC | PRN

## 2020-02-05 MED ORDER — ACETAMINOPHEN 325 MG PO TABS
650.0000 mg | ORAL_TABLET | ORAL | Status: DC | PRN
  Administered 2020-02-07 – 2020-02-08 (×2): 650 mg via ORAL
  Filled 2020-02-05 (×2): qty 2

## 2020-02-05 MED ORDER — ACETAMINOPHEN 650 MG RE SUPP: 650.0000 mg | RECTAL | Status: DC | PRN

## 2020-02-05 MED ORDER — CALCIUM GLUCONATE-NACL 1-0.675 GM/50ML-% IV SOLN
1.0000 g | Freq: Once | INTRAVENOUS | Status: AC
  Administered 2020-02-05: 1000 mg via INTRAVENOUS
  Filled 2020-02-05: qty 50

## 2020-02-05 MED ORDER — SODIUM CHLORIDE 0.9 % IV SOLN: Freq: Once | INTRAVENOUS | Status: DC

## 2020-02-05 MED ORDER — HYDRALAZINE HCL 20 MG/ML IJ SOLN
10.0000 mg | INTRAMUSCULAR | Status: DC | PRN
  Administered 2020-02-07: 10 mg via INTRAVENOUS
  Filled 2020-02-05: qty 1

## 2020-02-05 MED ORDER — ENOXAPARIN SODIUM 40 MG/0.4ML ~~LOC~~ SOLN
40.0000 mg | SUBCUTANEOUS | Status: DC
  Administered 2020-02-05 – 2020-02-06 (×2): 40 mg via SUBCUTANEOUS
  Filled 2020-02-05 (×2): qty 0.4

## 2020-02-05 MED ORDER — BUSPIRONE HCL 10 MG PO TABS
5.0000 mg | ORAL_TABLET | Freq: Two times a day (BID) | ORAL | Status: DC
  Administered 2020-02-05 – 2020-02-08 (×6): 5 mg via ORAL
  Filled 2020-02-05 (×6): qty 1

## 2020-02-05 MED ORDER — ASPIRIN 300 MG RE SUPP: 300.0000 mg | Freq: Every day | RECTAL | Status: DC

## 2020-02-05 MED ORDER — STROKE: EARLY STAGES OF RECOVERY BOOK: Freq: Once | Status: AC

## 2020-02-05 NOTE — H&P (Addendum)
History and Physical    Jacqueline Medina [id number] DOB: 1971-03-22 DOA: 02/05/2020  Referring MD/NP/PA: Dominica Severin, MD PCP: No primary care provider on file.  Patient coming from: Home  Chief Complaint: Right hand weakness  I have personally briefly reviewed patient's old medical records in [place]   HPI: Jacqueline Medina is a 49 y.o. right-handed female with medical history significant of hypertension who presented with weakness of her right hand starting 2 days ago.  She noticed that she kept dropping things in her right hand and was unable to write.  Patient new what she wanted to say, but was having difficulty getting the words out.  Associated symptoms include intermittent complaints of palpitations and fall about 1 week ago with right hip pain.  She denies having any headache, change in vision, leg weakness, chest pain, nausea, vomiting, diarrhea, or recent sick contacts.  Patient notes that she received her second Covid vaccine last Friday, but denied having any issues after receiving the vaccine.  She reports taking a daily aspirin, but also endorses taking oral contraceptive pills.  ED Course: On admission to the emergency department patient was noted to be afebrile with stable vital signs.  Labs significant for sodium 133, BUN 18, and creatinine 1.12.  CT scan of the brain revealed decreased attenuation in the left frontal lobe consistent with subacute ischemia.  Neurology had been consulted and patient was not a candidate for TPA given possible duration of symptoms.  [facility] called to admit.  Review of Systems  Constitutional: Negative for chills, diaphoresis and fever.  HENT: Negative for ear pain and nosebleeds.   Eyes: Negative for blurred vision and photophobia.  Respiratory: Negative for cough and shortness of breath.   Cardiovascular: Positive for palpitations. Negative for chest pain.  Gastrointestinal: Negative for diarrhea, nausea and vomiting.  Genitourinary:  Negative for dysuria and hematuria.  Musculoskeletal: Positive for falls and joint pain. Negative for back pain.  Skin: Negative for itching and rash.  Neurological: Positive for speech change and focal weakness. Negative for loss of consciousness.  Psychiatric/Behavioral: Negative for memory loss and substance abuse.    Past Medical History:  Diagnosis Date  . Hypertension     History reviewed. No pertinent surgical history.   reports that she has never smoked. She has never used smokeless tobacco. She reports previous alcohol use. She reports that she does not use drugs.  No Known Allergies  Family History  Problem Relation Age of Onset  . Diabetes Father     Prior to Admission medications   Medication Sig Start Date End Date Taking? Authorizing Provider  aspirin EC 81 MG tablet Take 81 mg by mouth daily.   Yes [provider]  busPIRone (BUSPAR) 5 MG tablet Take 5 mg by mouth in the morning and at bedtime.   Yes [provider]  cyclobenzaprine (FLEXERIL) 10 MG tablet Take 10 mg by mouth 3 (three) times daily as needed for muscle spasms.   Yes [provider]  hydrochlorothiazide (HYDRODIURIL) 25 MG tablet Take 25 mg by mouth daily.   Yes [provider]  lisinopril (ZESTRIL) 5 MG tablet Take 5 mg by mouth daily.   Yes [provider]  metoprolol tartrate (LOPRESSOR) 25 MG tablet Take 25 mg by mouth 2 (two) times daily.   Yes [provider]  Multiple Vitamin (MULTIVITAMIN WITH MINERALS) TABS tablet Take 1 tablet by mouth daily.   Yes [provider]  naproxen (NAPROSYN) 500 MG tablet Take  500 mg by mouth daily as needed for moderate pain.   Yes [provider]  Norgestim-Eth Estrad Triphasic (TRI-ESTARYLLA PO) Take 1 tablet by mouth daily.   Yes [provider]  potassium chloride (KLOR-CON) 20 MEQ packet Take 20 mEq by mouth daily.   Yes [provider]    Physical  Exam:  Constitutional: Middle-aged female NAD, calm, comfortable Vitals:   02/05/20 1218  BP: 130/80  Pulse: 69  Resp: 15  Temp: 97.9 F (36.6 C)  TempSrc: Oral  SpO2: 100%   Eyes: PERRL, lids and conjunctivae normal ENMT: Mucous membranes are moist. Posterior pharynx clear of any exudate or lesions.  Neck: normal, supple, no masses, no thyromegaly Respiratory: clear to auscultation bilaterally, no wheezing, no crackles. Normal respiratory effort. No accessory muscle use.  Cardiovascular: Regular rate and rhythm, no murmurs / rubs / gallops. No extremity edema. 2+ pedal pulses. No carotid bruits.  Abdomen: no tenderness, no masses palpated. No hepatosplenomegaly. Bowel sounds positive.  Musculoskeletal: no clubbing / cyanosis. No joint deformity upper and lower extremities. Good ROM, no contractures. Normal muscle tone.  Skin: no rashes, lesions, ulcers. No induration Neurologic: CN 2-12 grossly intact. Sensation intact, DTR normal. Strength 5/5 left upper and lower extremity.  Strength 4+/5 in the right hand and 5/5 in the right leg. Psychiatric: Normal judgment and insight. Alert and oriented x 3. Normal mood.     Labs on Admission: I have personally reviewed following labs and imaging studies  CBC: Recent Labs  Lab 02/05/20 1228 02/05/20 1259  WBC 10.0  --   NEUTROABS 7.0  --   HGB 14.5 14.6  HCT 42.9 43.0  MCV 91.3  --   PLT 301  --    Basic Metabolic Panel: Recent Labs  Lab 02/05/20 1228 02/05/20 1259  NA 133* 136  K 4.4 4.4  CL 97* 99  CO2 27  --   GLUCOSE 89 83  BUN 18 21*  CREATININE 1.12* 1.10*  CALCIUM 8.5*  --    GFR: CrCl cannot be calculated (Unknown ideal weight.). Liver Function Tests: Recent Labs  Lab 02/05/20 1228  AST 17  ALT 18  ALKPHOS 84  BILITOT 1.0  PROT 6.6  ALBUMIN 3.2*   No results for input(s): LIPASE, AMYLASE in the last 168 hours. No results for input(s): AMMONIA in the last 168 hours. Coagulation Profile: Recent Labs   Lab 02/05/20 1228  INR 1.0   Cardiac Enzymes: No results for input(s): CKTOTAL, CKMB, CKMBINDEX, TROPONINI in the last 168 hours. BNP (last 3 results) No results for input(s): PROBNP in the last 8760 hours. HbA1C: No results for input(s): HGBA1C in the last 72 hours. CBG: No results for input(s): GLUCAP in the last 168 hours. Lipid Profile: No results for input(s): CHOL, HDL, LDLCALC, TRIG, CHOLHDL, LDLDIRECT in the last 72 hours. Thyroid Function Tests: No results for input(s): TSH, T4TOTAL, FREET4, T3FREE, THYROIDAB in the last 72 hours. Anemia Panel: No results for input(s): VITAMINB12, FOLATE, FERRITIN, TIBC, IRON, RETICCTPCT in the last 72 hours. Urine analysis: No results found for: COLORURINE, APPEARANCEUR, LABSPEC, PHURINE, GLUCOSEU, HGBUR, BILIRUBINUR, KETONESUR, PROTEINUR, UROBILINOGEN, NITRITE, LEUKOCYTESUR Sepsis Labs: No results found for this or any previous visit (from the past 240 hour(s)).   Radiological Exams on Admission: CT HEAD WO CONTRAST  Addendum Date: 02/05/2020   ADDENDUM REPORT: 02/05/2020 14:53 ADDENDUM: The findings and impression should read left frontal lobe not right. Electronically Signed   By: Inez Catalina M.D.   On:  02/05/2020 14:53   Result Date: 02/05/2020 CLINICAL DATA:  Right hand weakness for 2 days, initial encounter EXAM: CT HEAD WITHOUT CONTRAST TECHNIQUE: Contiguous axial images were obtained from the base of the skull through the vertex without intravenous contrast. COMPARISON:  None. FINDINGS: Brain: There is an area of geographic decreased attenuation identified in the right frontal lobe near the vertex consistent with subacute ischemia. No other focal infarct is seen. No hemorrhage or mass lesion is noted. Vascular: No hyperdense vessel or unexpected calcification. Skull: Normal. Negative for fracture or focal lesion. Sinuses/Orbits: No acute finding. Other: None. IMPRESSION: Area of decreased attenuation in the right frontal lobe  consistent with subacute ischemia. No other focal abnormality is noted. Electronically Signed: By: Inez Catalina M.D. On: 02/05/2020 13:10   DG Hip Unilat W or Wo Pelvis 2-3 Views Right  Result Date: 02/05/2020 CLINICAL DATA:  Fall 1 week ago with persistent right hip pain, initial encounter EXAM: DG HIP (WITH OR WITHOUT PELVIS) 2-3V RIGHT COMPARISON:  None. FINDINGS: Pelvic ring is intact. Mild degenerative changes of the hip joints are noted bilaterally. No acute fracture or dislocation is seen. No soft tissue abnormality is noted. IMPRESSION: Degenerative change without acute abnormality. Electronically Signed   By: Inez Catalina M.D.   On: 02/05/2020 15:14    EKG: Independently reviewed.  Sinus rhythm at 71 bpm with short PR interval.  Assessment/Plan CVA: Acute/subacute.  Patient presents with right hand weakness and aphasia.  CT scan of the brain significant for left frontal lobe area of decreased attenuation concerning for subacute ischemia.  Neurology evaluated and recommended admitting for completion of stroke work-up.  Risk factors include hypertension and oral contraceptive pill use. -Admit to telemetry bed -Stroke order set initiated -Neuro checks -Follow-up MRI/MRA head w/o contrast -PT/OT/Speech to eval and treat -Check echocardiogram -Check carotid vascular ultrasound -Check Hemoglobin A1c and lipid panel in a.m. -ASA -Discontinue oral contraceptive pill -Appreciate neurology consultative services, will follow-up for further recommendation  Essential hypertension: Patient's initial blood pressures were noted to be within normal limits.  Home blood pressure medications include metoprolol 25 mg twice daily, lisinopril 5 mg daily, and hydrochlorothiazide 25 mg daily. -Allow for permissive hypertension at this time  -Restart home blood pressure medications in 24 to 48 hours  Hyponatremia: Acute.  Initial sodium noted to be 133.  Patient on diuretics of hydrochlorothiazide likely  as cause. -Patient given 1 L of normal saline IV fluids -Recheck sodium levels in a.m.  Anxiety -Continue BuSpar  DVT prophylaxis: Lovenox Code Status: Full Family Communication: No family requested be updated at this time Disposition Plan: Likely discharge home once medically stable Consults called: Neurology Admission status: Inpatient  Norval Morton MD [facility] Pager [phone]   If 7PM-7AM, please contact night-coverage [email or web address] Password [facility]  02/05/2020, 3:29 PM

## 2020-02-05 NOTE — ED Notes (Signed)
Patient transported to MRI 

## 2020-02-05 NOTE — ED Provider Notes (Signed)
[facility] EMERGENCY DEPARTMENT Provider Note   CSN: [id number] Arrival date & time: 02/05/20  1214     History Chief Complaint  Patient presents with  . Extremity Weakness    Jacqueline Medina is a 49 y.o. female.  The history is provided by the patient and medical records. No language interpreter was used.  Cerebrovascular Accident This is a new problem. The current episode started 2 days ago. The problem occurs constantly. The problem has been gradually improving. Pertinent negatives include no chest pain, no abdominal pain, no headaches and no shortness of breath. Nothing aggravates the symptoms. Nothing relieves the symptoms. She has tried nothing for the symptoms. The treatment provided no relief.       Past Medical History:  Diagnosis Date  . Hypertension     There are no problems to display for this patient.   History reviewed. No pertinent surgical history.   OB History   No obstetric history on file.     No family history on file.  Social History   Tobacco Use  . Smoking status: Not on file  Substance Use Topics  . Alcohol use: Not on file  . Drug use: Not on file    Home Medications Prior to Admission medications   Not on File    Allergies    Patient has no known allergies.  Review of Systems   Review of Systems  Constitutional: Negative for chills, diaphoresis, fatigue and fever.  HENT: Negative for congestion.   Eyes: Negative for visual disturbance.  Respiratory: Negative for cough, chest tightness and shortness of breath.   Cardiovascular: Negative for chest pain, palpitations and leg swelling.  Gastrointestinal: Negative for abdominal pain, constipation, diarrhea, nausea and vomiting.  Genitourinary: Negative for dysuria, flank pain and frequency.  Musculoskeletal: Negative for back pain, neck pain and neck stiffness.  Neurological: Positive for speech difficulty, weakness and numbness. Negative for dizziness, facial  asymmetry, light-headedness and headaches.  Psychiatric/Behavioral: Negative for agitation and confusion.  All other systems reviewed and are negative.   Physical Exam Updated Vital Signs BP 130/80 (BP Location: Right Arm)   Pulse 69   Temp 97.9 F (36.6 C) (Oral)   Resp 15   SpO2 100%   Physical Exam Vitals and nursing note reviewed.  Constitutional:      General: She is not in acute distress.    Appearance: She is well-developed. She is not ill-appearing, toxic-appearing or diaphoretic.  HENT:     Head: Normocephalic and atraumatic.     Mouth/Throat:     Mouth: Mucous membranes are moist.     Pharynx: No oropharyngeal exudate or posterior oropharyngeal erythema.  Eyes:     Extraocular Movements: Extraocular movements intact.     Conjunctiva/sclera: Conjunctivae normal.     Pupils: Pupils are equal, round, and reactive to light.  Cardiovascular:     Rate and Rhythm: Normal rate and regular rhythm.     Pulses: Normal pulses.     Heart sounds: No murmur.  Pulmonary:     Effort: Pulmonary effort is normal. No respiratory distress.     Breath sounds: Normal breath sounds.  Abdominal:     General: Abdomen is flat.     Palpations: Abdomen is soft.     Tenderness: There is no abdominal tenderness. There is no right CVA tenderness, left CVA tenderness, guarding or rebound.  Musculoskeletal:        General: Tenderness present.     Cervical back: Neck supple.  No tenderness.     Right hip: Tenderness present.     Right lower leg: No edema.     Left lower leg: No edema.       Legs:  Skin:    General: Skin is warm and dry.     Capillary Refill: Capillary refill takes less than 2 seconds.     Findings: No erythema.  Neurological:     Mental Status: She is alert and oriented to person, place, and time.     GCS: GCS eye subscore is 4. GCS verbal subscore is 5. GCS motor subscore is 6.     Cranial Nerves: No cranial nerve deficit, dysarthria or facial asymmetry.     Sensory: No  sensory deficit.     Motor: No weakness, tremor, abnormal muscle tone or seizure activity.     Coordination: Coordination normal. Finger-Nose-Finger Test normal.     Comments: Mild expressive aphasia.  No sensory deficit or weakness on my exam however patient reports transient right arm and right leg weakness as well as left facial numbness.  No bruit on exam.  Psychiatric:        Mood and Affect: Mood normal.     ED Results / Procedures / Treatments   Labs (all labs ordered are listed, but only abnormal results are displayed) Labs Reviewed  COMPREHENSIVE METABOLIC PANEL - Abnormal; Notable for the following components:      Result Value   Sodium 133 (*)    Chloride 97 (*)    Creatinine, Ser 1.12 (*)    Calcium 8.5 (*)    Albumin 3.2 (*)    GFR calc non Af Amer 57 (*)    All other components within normal limits  I-STAT CHEM 8, ED - Abnormal; Notable for the following components:   BUN 21 (*)    Creatinine, Ser 1.10 (*)    Calcium, Ion 1.09 (*)    All other components within normal limits  PROTIME-INR  APTT  CBC  DIFFERENTIAL  HIV ANTIBODY (ROUTINE TESTING W REFLEX)  I-STAT BETA HCG BLOOD, ED ([facility], [facility], [facility] ONLY)  CBG MONITORING, ED    EKG EKG Interpretation  Date/Time:  Saturday Feb 05 2020 12:15:44 EDT Ventricular Rate:  71 PR Interval:  108 QRS Duration: 72 QT Interval:  404 QTC Calculation: 439 R Axis:   36 Text Interpretation: Sinus rhythm with short PR Otherwise normal ECG No prior ECG for comparison. no STEMI Confirmed by Antony Blackbird [phone]) on 02/05/2020 2:25:33 PM   Radiology CT HEAD WO CONTRAST  Addendum Date: 02/05/2020   ADDENDUM REPORT: 02/05/2020 14:53 ADDENDUM: The findings and impression should read left frontal lobe not right. Electronically Signed   By: Inez Catalina M.D.   On: 02/05/2020 14:53   Result Date: 02/05/2020 CLINICAL DATA:  Right hand weakness for 2 days, initial encounter EXAM: CT HEAD WITHOUT CONTRAST TECHNIQUE: Contiguous axial  images were obtained from the base of the skull through the vertex without intravenous contrast. COMPARISON:  None. FINDINGS: Brain: There is an area of geographic decreased attenuation identified in the right frontal lobe near the vertex consistent with subacute ischemia. No other focal infarct is seen. No hemorrhage or mass lesion is noted. Vascular: No hyperdense vessel or unexpected calcification. Skull: Normal. Negative for fracture or focal lesion. Sinuses/Orbits: No acute finding. Other: None. IMPRESSION: Area of decreased attenuation in the right frontal lobe consistent with subacute ischemia. No other focal abnormality is noted. Electronically Signed: By: Inez Catalina M.D. On:  02/05/2020 13:10   DG Hip Unilat W or Wo Pelvis 2-3 Views Right  Result Date: 02/05/2020 CLINICAL DATA:  Fall 1 week ago with persistent right hip pain, initial encounter EXAM: DG HIP (WITH OR WITHOUT PELVIS) 2-3V RIGHT COMPARISON:  None. FINDINGS: Pelvic ring is intact. Mild degenerative changes of the hip joints are noted bilaterally. No acute fracture or dislocation is seen. No soft tissue abnormality is noted. IMPRESSION: Degenerative change without acute abnormality. Electronically Signed   By: Inez Catalina M.D.   On: 02/05/2020 15:14    Procedures Procedures (including critical care time)  Medications Ordered in ED Medications  sodium chloride flush (NS) 0.9 % injection 3 mL (has no administration in time range)    ED Course  I have reviewed the triage vital signs and the nursing notes.  Pertinent labs & imaging results that were available during my care of the patient were reviewed by me and considered in my medical decision making (see chart for details).    MDM Rules/Calculators/A&P                      Adesewa Parlier is a 49 y.o. female with a past medical history significant for hypertension and strong history of stroke in the family who presents with transient right leg weakness, right arm weakness,  left facial numbness, and persistent aphasia for the last few days.  She reports that it has been "a minute" since she has had some right leg drop and weakness that seems to come and go over the last few weeks.  She reports that on Thursday she started having difficulty speaking and getting words out but not slurring her speech.  She also reports she was having some left facial tingling and numbness but no droop.  She reports he was dropping things with her right hand and was unable to complete tasks on her right hand.  She is right-handed.  She denies any headache, neck pain, neck stiffness.  Denies fevers or chills.  Denies recent cough, congestion, urinary symptoms, or GI symptoms.  She denies any visual changes.  She reports her sister had a stroke in her 62s.  She denies other complaints.  On exam, patient did have some stuttering and expressive aphasia but was able to get out most of her sentences clearly.  She did not have any focal numbness or weakness on my exam and had normal finger-nose-finger testing bilaterally.  Normal sensation and strength in the extremities and face.  No facial droop.  Patient had normal extraocular movements and pupil exam.  Neck was nontender.  Lungs clear and chest nontender.  Abdomen was also nontender and back was nontender.  Patient had tenderness in the right hip and she reports that she actually had a fall last week and started having right hip pain.  Patient had CT and labs in triage and the CT of her head shows concern for stroke.  Neurology was called who requests that she be admitted to medicine and they will see her in consultation.  They agreed with MRI of her brain as well.  We will get x-ray of her right hip to look for injury however I suspect it was more musculoskeletal and soft tissue injury than fracture at this time.  Otherwise, her laboratory testing showed creatinine of around 1.1 but was otherwise reassuring.  Vital signs reassuring.  Patient will be  admitted for acute stroke.      Final Clinical Impression(s) / ED Diagnoses  Final diagnoses:  Cerebrovascular accident (CVA), unspecified mechanism ([place])  Aphasia  Transient weakness of right lower extremity  Weakness of right arm  Numbness and tingling of left side of face  Right hip pain     Clinical Impression: 1. Cerebrovascular accident (CVA), unspecified mechanism ([place])   2. Aphasia   3. Transient weakness of right lower extremity   4. Weakness of right arm   5. Numbness and tingling of left side of face   6. Right hip pain     Disposition: Admit  This note was prepared with assistance of Dragon voice recognition software. Occasional wrong-word or sound-a-like substitutions may have occurred due to the inherent limitations of voice recognition software.     Desarae Placide, Gwenyth Allegra, MD 02/05/20 [phone]

## 2020-02-05 NOTE — Consult Note (Addendum)
NEURO HOSPITALIST  CONSULT   Requesting Physician: Dr. Tamala Julian    Chief Complaint: right hand weakness  History obtained from:  Patient    HPI:                                                                                                                                         Jacqueline Medina is an 49 y.o. female HTN who presented to [facility] ED with complaints of right side arm weakness.   Patient states that on Thursday she was fine, but on Thursday night while she was at work she noticed that she started dropping things with her right hand and could not write as well.  She does state that she did have left facial numbness that has now resolved.  She made it through her shift at work went home Friday morning and remained the same.  She felt like she was dropping things more often and decided to come to the hospital today.  She does take a daily baby aspirin denies missing any doses, and takes her blood pressure medication regularly.  Denies any problems walking, leg weakness, speech difficulty, chest pain, shortness of breath, vision changes, EtOH use, smoking, drug use.  ED course:  CTH: no hemorrhage; area of decreased attentuation right frontal lobe consistent with subacute ischemia. MRI/MRA head: Pending NIHSS: 0  tPA Given: No; due to presenting outside of window Modified Rankin: Rankin Score=0    Past Medical History:  Diagnosis Date  . Hypertension     History reviewed. No pertinent surgical history.  No family history on file.    Social History:  has no history on file for tobacco, alcohol, and drug.  Allergies: No Known Allergies  Medications:                                                                                                                           Current Facility-Administered Medications  Medication Dose Route Frequency Provider Last Rate Last Admin  .  stroke: mapping our early stages of recovery book   Does  not apply Once Tamala Julian, Rondell A,  MD      . 0.9 %  sodium chloride infusion   Intravenous Once Fuller Plan A, MD      . acetaminophen (TYLENOL) tablet 650 mg  650 mg Oral Q4H PRN Norval Morton, MD       Or  . acetaminophen (TYLENOL) 160 MG/5ML solution 650 mg  650 mg Per Tube Q4H PRN Norval Morton, MD       Or  . acetaminophen (TYLENOL) suppository 650 mg  650 mg Rectal Q4H PRN Fuller Plan A, MD      . aspirin suppository 300 mg  300 mg Rectal Daily Tamala Julian, Rondell A, MD       Or  . aspirin tablet 325 mg  325 mg Oral Daily Smith, Rondell A, MD      . cyclobenzaprine (FLEXERIL) tablet 10 mg  10 mg Oral TID PRN Fuller Plan A, MD      . enoxaparin (LOVENOX) injection 40 mg  40 mg Subcutaneous Q24H Smith, Rondell A, MD      . senna-docusate (Senokot-S) tablet 1 tablet  1 tablet Oral QHS PRN Smith, Rondell A, MD      . sodium chloride flush (NS) 0.9 % injection 3 mL  3 mL Intravenous Once Norval Morton, MD       Current Outpatient Medications  Medication Sig Dispense Refill  . aspirin EC 81 MG tablet Take 81 mg by mouth daily.    . busPIRone (BUSPAR) 5 MG tablet Take 5 mg by mouth in the morning and at bedtime.    . cyclobenzaprine (FLEXERIL) 10 MG tablet Take 10 mg by mouth 3 (three) times daily as needed for muscle spasms.    . hydrochlorothiazide (HYDRODIURIL) 25 MG tablet Take 25 mg by mouth daily.    Marland Kitchen lisinopril (ZESTRIL) 5 MG tablet Take 5 mg by mouth daily.    . metoprolol tartrate (LOPRESSOR) 25 MG tablet Take 25 mg by mouth 2 (two) times daily.    . Multiple Vitamin (MULTIVITAMIN WITH MINERALS) TABS tablet Take 1 tablet by mouth daily.    . naproxen (NAPROSYN) 500 MG tablet Take 500 mg by mouth daily as needed for moderate pain.    Lenard Forth Triphasic (TRI-ESTARYLLA PO) Take 1 tablet by mouth daily.    . potassium chloride (KLOR-CON) 20 MEQ packet Take 20 mEq by mouth daily.       ROS:                                                                                                                                        ROS was performed and is negative except as noted in HPI    General Examination:  Blood pressure 130/80, pulse 69, temperature 97.9 F (36.6 C), temperature source Oral, resp. rate 15, last menstrual period 01/11/2020, SpO2 100 %.  Physical Exam  Constitutional: Appears well-developed and well-nourished.  Psych: Affect appropriate to situation Eyes: Normal external eye and conjunctiva. HENT: Normocephalic, no lesions, without obvious abnormality.   Musculoskeletal-no joint tenderness, deformity or swelling Cardiovascular: Normal rate and regular rhythm.  Respiratory: Effort normal, non-labored breathing saturations WNL GI: Soft.  No distension. There is no tenderness.  Skin: WDI  Neurological Examination Mental Status: Alert, oriented, thought content appropriate.  Speech fluent without evidence of aphasia.  Able to follow  commands without difficulty.  Naming and repetition intact Cranial Nerves: II: Visual fields grossly normal,  III,IV, VI: ptosis not present, extra-ocular motions intact bilaterally, pupils equal, round, reactive to light and accommodation V,VII: smile symmetric, facial light touch sensation normal bilaterally VIII: hearing normal bilaterally IX,X: uvula rises midline XI: bilateral shoulder shrug XII: midline tongue extension Motor: Right : Upper extremity   5/5 Left:     Upper extremity   5/5  Lower extremity   5/5  Lower extremity   5/5 Tone and bulk:normal tone throughout; no atrophy noted Sensory:  light touch intact throughout, bilaterally Deep Tendon Reflexes: 2+ and symmetric biceps and patella Plantars: Right: downgoing   Left: downgoing Cerebellar: Normal FNF, HTS Gait: Deferred   Lab Results: Basic Metabolic Panel: Recent Labs  Lab 02/05/20 1228 02/05/20 1259  NA 133* 136  K  4.4 4.4  CL 97* 99  CO2 27  --   GLUCOSE 89 83  BUN 18 21*  CREATININE 1.12* 1.10*  CALCIUM 8.5*  --     CBC: Recent Labs  Lab 02/05/20 1228 02/05/20 1259  WBC 10.0  --   NEUTROABS 7.0  --   HGB 14.5 14.6  HCT 42.9 43.0  MCV 91.3  --   PLT 301  --     CBG: No results for input(s): GLUCAP in the last 168 hours.  Imaging: CT HEAD WO CONTRAST  Addendum Date: 02/05/2020   ADDENDUM REPORT: 02/05/2020 14:53 ADDENDUM: The findings and impression should read left frontal lobe not right. Electronically Signed   By: Inez Catalina M.D.   On: 02/05/2020 14:53   Result Date: 02/05/2020 CLINICAL DATA:  Right hand weakness for 2 days, initial encounter EXAM: CT HEAD WITHOUT CONTRAST TECHNIQUE: Contiguous axial images were obtained from the base of the skull through the vertex without intravenous contrast. COMPARISON:  None. FINDINGS: Brain: There is an area of geographic decreased attenuation identified in the right frontal lobe near the vertex consistent with subacute ischemia. No other focal infarct is seen. No hemorrhage or mass lesion is noted. Vascular: No hyperdense vessel or unexpected calcification. Skull: Normal. Negative for fracture or focal lesion. Sinuses/Orbits: No acute finding. Other: None. IMPRESSION: Area of decreased attenuation in the right frontal lobe consistent with subacute ischemia. No other focal abnormality is noted. Electronically Signed: By: Inez Catalina M.D. On: 02/05/2020 13:10   DG Hip Unilat W or Wo Pelvis 2-3 Views Right  Result Date: 02/05/2020 CLINICAL DATA:  Fall 1 week ago with persistent right hip pain, initial encounter EXAM: DG HIP (WITH OR WITHOUT PELVIS) 2-3V RIGHT COMPARISON:  None. FINDINGS: Pelvic ring is intact. Mild degenerative changes of the hip joints are noted bilaterally. No acute fracture or dislocation is seen. No soft tissue abnormality is noted. IMPRESSION: Degenerative change without acute abnormality. Electronically Signed   By: Linus Mako.D.  On: 02/05/2020 15:14       Laurey Morale, MSN, NP-C [facility] [phone]  02/05/2020, 4:11 PM   Attending physician note to follow with Assessment and plan .  I have seen the patient and reviewed the above note.   Assessment: 49 y.o. female with past medical history HTN who presented with acute multifocal distal infarcts.  She has bilateral hemispheres as well as cortical and subcortical involvement, most consistent with embolic phenomena.  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  Stroke Risk Factors - hypertension     Recommendations: -- BP goal : Permissive HTN upto 220/110 mmHg (for 24-48 post admission)  --MRI Brain  --carotid dopplers --Echocardiogram --ASA and plavix x 3 weeks.  -- High intensity Statin if LDL >70 -- HgbA1c, fasting lipid panel -- PT consult, OT consult, Speech consult --Telemetry monitoring --Frequent neuro checks --Stroke swallow screen --Please page the Stroke team from 8am-4pm.   You can look them up on [email or web address]    Roland Rack, MD [facility] [phone]  If 7pm- 7am, please page neurology on call as listed in [place].

## 2020-02-05 NOTE — ED Triage Notes (Signed)
Pt states she started noticing weakness in her R hand on thurs evening when she was unable to hold onto her keys, denies any feelings of leg weakness, notes some possible slurred speech. States that her hand still feels weak, speech clear, face symmetrical. Endorses some numbness to L jaw yesterday morning that has resolved. A/ox4. Grip strength equal, no arm drift.

## 2020-02-06 ENCOUNTER — Encounter (HOSPITAL_COMMUNITY): Payer: Self-pay | Admitting: Internal Medicine

## 2020-02-06 ENCOUNTER — Inpatient Hospital Stay (HOSPITAL_COMMUNITY): Payer: BC Managed Care – PPO

## 2020-02-06 DIAGNOSIS — I6389 Other cerebral infarction: Secondary | ICD-10-CM

## 2020-02-06 DIAGNOSIS — I63513 Cerebral infarction due to unspecified occlusion or stenosis of bilateral middle cerebral arteries: Secondary | ICD-10-CM

## 2020-02-06 DIAGNOSIS — I675 Moyamoya disease: Secondary | ICD-10-CM

## 2020-02-06 DIAGNOSIS — I63232 Cerebral infarction due to unspecified occlusion or stenosis of left carotid arteries: Secondary | ICD-10-CM

## 2020-02-06 DIAGNOSIS — I63423 Cerebral infarction due to embolism of bilateral anterior cerebral arteries: Secondary | ICD-10-CM

## 2020-02-06 LAB — SARS CORONAVIRUS 2 (TAT 6-24 HRS): SARS Coronavirus 2: NEGATIVE

## 2020-02-06 LAB — HEMOGLOBIN A1C
Hgb A1c MFr Bld: 5.5 % (ref 4.8–5.6)
Mean Plasma Glucose: 111.15 mg/dL

## 2020-02-06 LAB — ECHOCARDIOGRAM COMPLETE
Height: 66 in
Weight: 2469.15 oz

## 2020-02-06 LAB — HIV ANTIBODY (ROUTINE TESTING W REFLEX): HIV Screen 4th Generation wRfx: NONREACTIVE

## 2020-02-06 LAB — LIPID PANEL
Cholesterol: 203 mg/dL — ABNORMAL HIGH (ref 0–200)
HDL: 42 mg/dL (ref >40)
LDL Cholesterol: 107 mg/dL — ABNORMAL HIGH (ref 0–99)
Total CHOL/HDL Ratio: 4.8 RATIO
Triglycerides: 270 mg/dL — ABNORMAL HIGH (ref <150)
VLDL: 54 mg/dL — ABNORMAL HIGH (ref 0–40)

## 2020-02-06 LAB — C-REACTIVE PROTEIN: CRP: 3.7 mg/dL — ABNORMAL HIGH (ref <1.0)

## 2020-02-06 LAB — SEDIMENTATION RATE: Sed Rate: 4 mm/hr (ref 0–22)

## 2020-02-06 IMAGING — CT CT ANGIO HEAD
2 of 7 series · 8 of 33 positions shown · IV contrast (APPLIED)
Comparison: MRI yesterday.
COMPARISON: MRI yesterday.

Addendum:
CLINICAL DATA: Aphasia. Right arm and leg weakness. MRI showed
acute right ACA territory strokes and left frontal stroke.

EXAM:
CT ANGIOGRAPHY HEAD AND NECK
TECHNIQUE: Multidetector CT imaging of the head and neck was performed using
the standard protocol during bolus administration of intravenous
contrast. Multiplanar CT image reconstructions and MIPs were
obtained to evaluate the vascular anatomy. Carotid stenosis
measurements (when applicable) are obtained utilizing NASCET
criteria, using the distal internal carotid diameter as the
denominator.
CONTRAST:  50mL OMNIPAQUE IOHEXOL 350 MG/ML SOLN

[Series 5: cta neck/head · axial · 0.54mm/px · z∈[-236,-114]mm · 2 of 183 slices shown]
[im 61/183  soft-tissue]
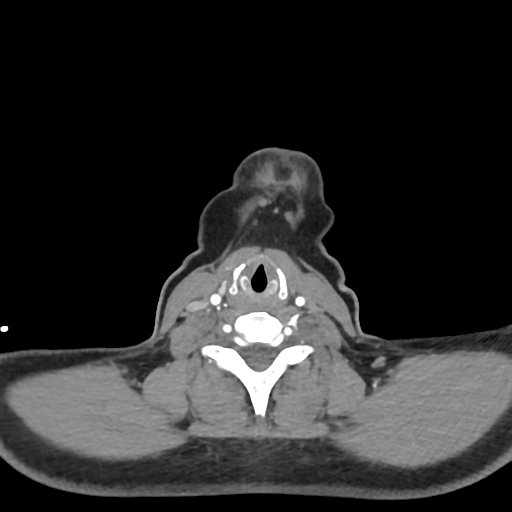
[im 122/183  soft-tissue]
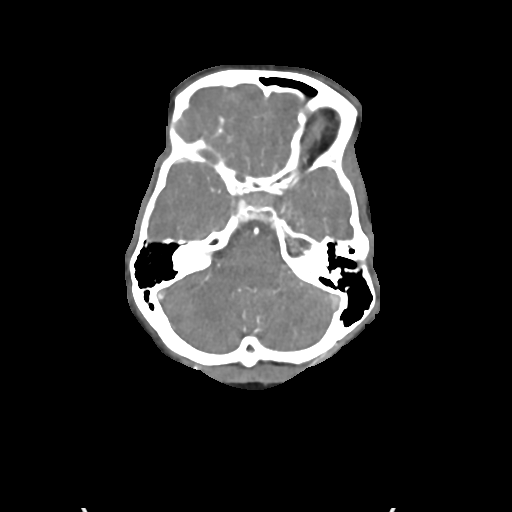

[Series 7: ax thins · axial · 0.46mm/px · z∈[-331,-70]mm · 6 of 373 slices shown]
[im 54/373  soft-tissue]
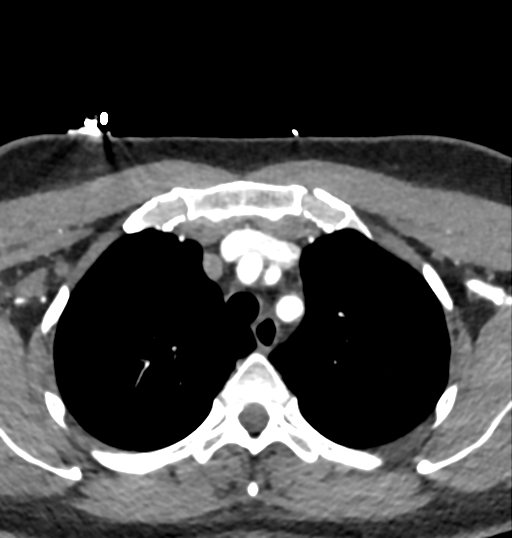
[im 107/373  bone]
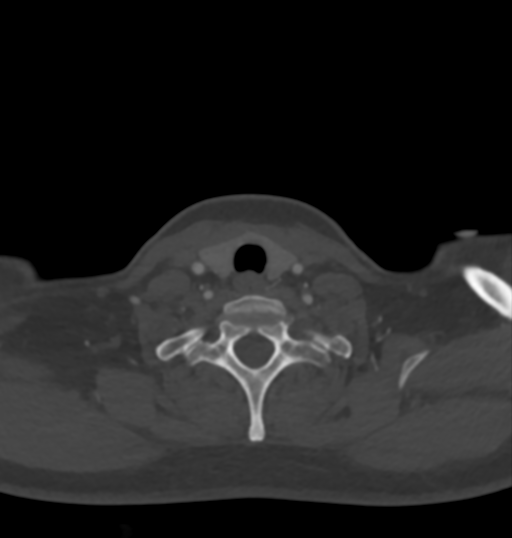
[im 160/373  soft-tissue]
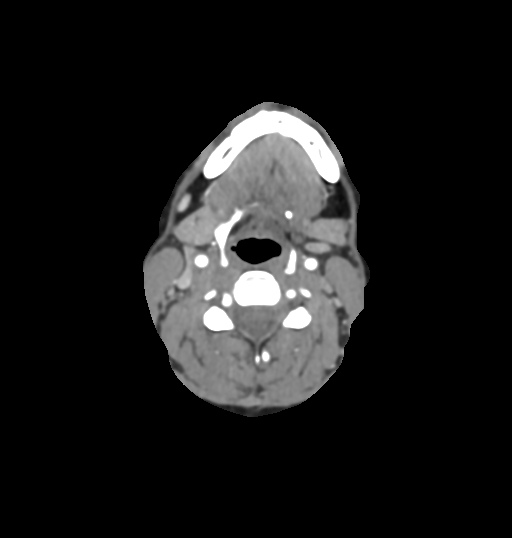
[im 213/373  bone]
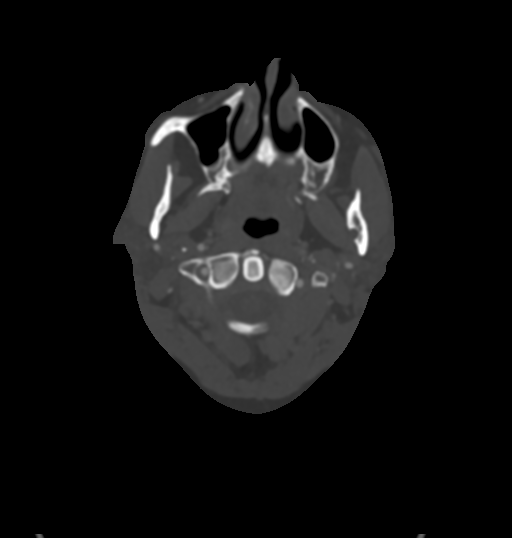
[im 266/373  soft-tissue]
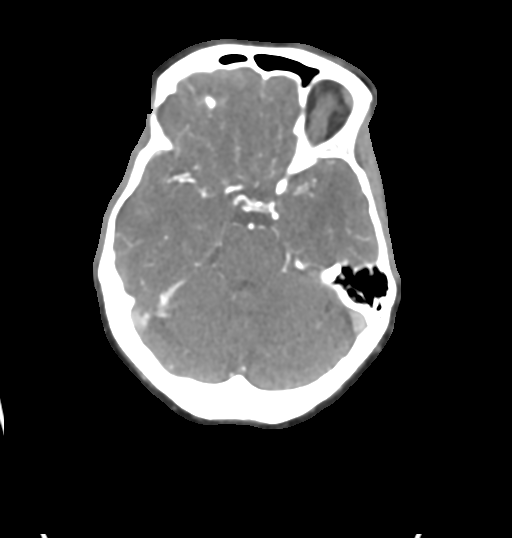
[im 319/373  bone]
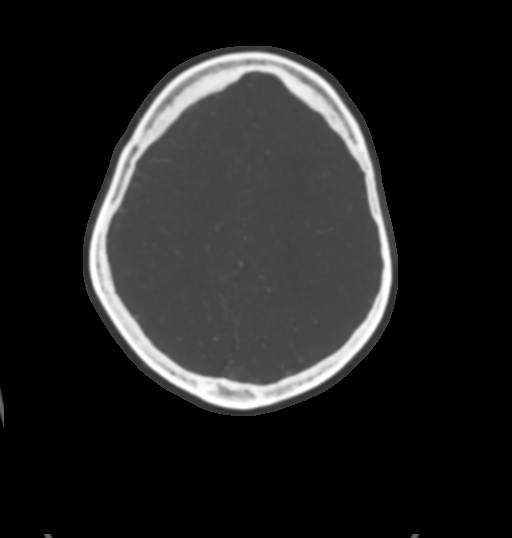

[8 of 33 positions shown; findings below may reference images not displayed]

FINDINGS: CTA NECK FINDINGS

Aortic arch: Aorta appears normal without visible atherosclerotic
change. Branching pattern is normal without origin stenosis.

Right carotid system: Common carotid artery widely patent to the
bifurcation. Carotid bifurcation is normal without soft or calcified
plaque. Cervical ICA is normal.

Left carotid system: Common carotid artery widely patent to the
bifurcation. Carotid bifurcation is normal. Narrowing of the left
ICA beginning at the distal bulb and extending through the skull
base. This is secondary to out flow restriction. See below.

Vertebral arteries: Both vertebral artery origins widely patent.
Both vertebral arteries normal through the cervical region.

Skeleton: Normal

Other neck: Abnormal appearing bilateral level 2 nodes showing
heterogeneous nature and enhancement. Nature of this is uncertain.
Largest node is a left level 2 node measuring 20 x 12 x 10 mm.

Upper chest: Normal

Review of the MIP images confirms the above findings

CTA HEAD FINDINGS

Anterior circulation: Right internal carotid artery is patent
through the skull base. The vessel is narrowed in the siphon region,
right anterior cerebral artery is either occluded or extremely
small. Left ICA is a very small vessel, only 1 mm, patent through
the skull base and siphon region but occluded at the supraclinoid
ICA level. There is collateral or reconstituted flow in the left MCA
territory. Less vigorous reconstituted or collateral flow in the
left ACA territory region.

Posterior circulation: Both vertebral arteries are patent to the
basilar. No basilar stenosis. Posterior circulation branch vessels
appear patent and normal.

Venous sinuses: Patent and normal.

Anatomic variants: None significant.

Review of the MIP images confirms the above findings
IMPRESSION: No sign of atherosclerotic vascular disease.

Left supraclinoid ICA occlusion. Reconstituted/collateral supply
visible in the anterior and middle cerebral territories, with some
deficiency in the left frontal region. Distal right ICA is
abnormally small but does remain patent, supplying the right middle
cerebral artery territory. Occlusion or severe stenosis of the right
ACA. Reconstituted/collateral distal flow.

The differential diagnosis in this case is that of moyamoya disease
versus is some other sort of vasculitis. The age of presentation is
late for moyamoya disease.

ADDENDUM:
The impression should also include note of abnormal and somewhat
unusual appearing bilateral level 2 cervical lymph nodes. The nodes
are slightly enlarged, enhance more than normal and show some
internal heterogeneity. The nature of this is unclear. There is no
older study for comparison. The single largest level 2 node on the
left measures 20 x 12 x 10 mm, only mildly enlarged. These may
simply be reactive to some systemic or head and neck inflammatory
process.

*** End of Addendum ***
FINDINGS: CTA NECK FINDINGS

Aortic arch: Aorta appears normal without visible atherosclerotic
change. Branching pattern is normal without origin stenosis.

Right carotid system: Common carotid artery widely patent to the
bifurcation. Carotid bifurcation is normal without soft or calcified
plaque. Cervical ICA is normal.

Left carotid system: Common carotid artery widely patent to the
bifurcation. Carotid bifurcation is normal. Narrowing of the left
ICA beginning at the distal bulb and extending through the skull
base. This is secondary to out flow restriction. See below.

Vertebral arteries: Both vertebral artery origins widely patent.
Both vertebral arteries normal through the cervical region.

Skeleton: Normal

Other neck: Abnormal appearing bilateral level 2 nodes showing
heterogeneous nature and enhancement. Nature of this is uncertain.
Largest node is a left level 2 node measuring 20 x 12 x 10 mm.

Upper chest: Normal

Review of the MIP images confirms the above findings

CTA HEAD FINDINGS

Anterior circulation: Right internal carotid artery is patent
through the skull base. The vessel is narrowed in the siphon region,
right anterior cerebral artery is either occluded or extremely
small. Left ICA is a very small vessel, only 1 mm, patent through
the skull base and siphon region but occluded at the supraclinoid
ICA level. There is collateral or reconstituted flow in the left MCA
territory. Less vigorous reconstituted or collateral flow in the
left ACA territory region.

Posterior circulation: Both vertebral arteries are patent to the
basilar. No basilar stenosis. Posterior circulation branch vessels
appear patent and normal.

Venous sinuses: Patent and normal.

Anatomic variants: None significant.

Review of the MIP images confirms the above findings
IMPRESSION: No sign of atherosclerotic vascular disease.

Left supraclinoid ICA occlusion. Reconstituted/collateral supply
visible in the anterior and middle cerebral territories, with some
deficiency in the left frontal region. Distal right ICA is
abnormally small but does remain patent, supplying the right middle
cerebral artery territory. Occlusion or severe stenosis of the right
ACA. Reconstituted/collateral distal flow.

The differential diagnosis in this case is that of moyamoya disease
versus is some other sort of vasculitis. The age of presentation is
late for moyamoya disease.

## 2020-02-06 MED ORDER — CLOPIDOGREL BISULFATE 75 MG PO TABS
75.0000 mg | ORAL_TABLET | Freq: Every day | ORAL | Status: DC
  Administered 2020-02-06 – 2020-02-08 (×3): 75 mg via ORAL
  Filled 2020-02-06 (×3): qty 1

## 2020-02-06 MED ORDER — STROKE: EARLY STAGES OF RECOVERY BOOK
Status: AC
  Filled 2020-02-06: qty 1

## 2020-02-06 MED ORDER — SODIUM CHLORIDE 0.9 % IV SOLN: INTRAVENOUS | Status: DC | PRN

## 2020-02-06 MED ORDER — SODIUM CHLORIDE 0.9 % IV SOLN: INTRAVENOUS | Status: DC

## 2020-02-06 MED ORDER — IOHEXOL 350 MG/ML SOLN
50.0000 mL | Freq: Once | INTRAVENOUS | Status: AC | PRN
  Administered 2020-02-06: 50 mL via INTRAVENOUS

## 2020-02-06 MED ORDER — ATORVASTATIN CALCIUM 40 MG PO TABS
40.0000 mg | ORAL_TABLET | Freq: Every day | ORAL | Status: DC
  Administered 2020-02-06: 40 mg via ORAL
  Filled 2020-02-06: qty 1

## 2020-02-06 NOTE — Evaluation (Signed)
Physical Therapy Evaluation and Discharge Patient Details Name: Jacqueline Medina MRN: [id number] DOB: Mar 31, 1971 Today's Date: 02/06/2020   History of Present Illness  49 y.o. right-handed female with medical history significant of hypertension who presented 02/05/20 with weakness of her right hand x 2 days. Patient knew what she wanted to say, but was having difficulty getting the words out. CT scan of the brain significant for left frontal lobe area of decreased attenuation concerning for subacute ischemia. MRI brain multiple small acute infarcts rt ACA territory and lt precentral gyrus; subacute lt frontal infarct; occlusion distal lt ICA, lt MCA and bil ACAs, severe stenosis rt ICA (Moyamoya pattern); moderately extensive chronic small vessel disease. Permissive HTN up to 220/110; pt reports fall on steps in the rain, wearing flip flops 2 weeks ago with resulting rt hip pain. xray hip negative  Clinical Impression   Patient evaluated by Physical Therapy with no further acute PT needs identified. Patient's gait primarily effected by rt hip pain due to a fall down slippery step, in the rain, in flip flops 2 weeks PTA. Xray on admission was negative. She has point tenderness over greater trochanter and pain increases to 8/10 with walking (gradual incr over distance). She also had what she thought was a stroke "years ago" that effected her RLE and the way she walks. Pt with slightly limited rt ankle dorsiflexion ROM. She did not have imbalance with components of DGI that were completed.  All education has been completed (re: possible causes for hip pain; try to rest, ice, and discuss with MD) and the patient has no further questions.  See below for any follow-up Physical Therapy or equipment needs. PT is signing off. Thank you for this referral.     Follow Up Recommendations Other (comment)(?ortho PT for rt hip pain)    Equipment Recommendations  None recommended by PT    Recommendations for Other  Services OT consult  Pt feels RUE is not quite back to baseline.    Precautions / Restrictions Precautions Precautions: None      Mobility  Bed Mobility Overal bed mobility: Independent                Transfers Overall transfer level: Independent Equipment used: None                Ambulation/Gait Ambulation/Gait assistance: Supervision;Independent Gait Distance (Feet): 300 Feet Assistive device: None Gait Pattern/deviations: Step-through pattern;Decreased step length - right;Decreased dorsiflexion - right;Decreased weight shift to right;Antalgic Gait velocity: WNL   General Gait Details: pt reports her walking is 80% back to normal; has 8/10 lateral rt hip pain since fall 2 weeks ago; noted decr to no RUE swing  Stairs            Wheelchair Mobility    Modified Rankin (Stroke Patients Only) Modified Rankin (Stroke Patients Only) Pre-Morbid Rankin Score: No significant disability Modified Rankin: Slight disability     Balance                                 Standardized Balance Assessment Standardized Balance Assessment : Dynamic Gait Index   Dynamic Gait Index Level Surface: Mild Impairment Change in Gait Speed: Normal Gait with Horizontal Head Turns: Normal Gait with Vertical Head Turns: Normal Gait and Pivot Turn: Normal Step Over Obstacle: Mild Impairment Step Around Obstacles: Normal       Pertinent Vitals/Pain Pain Assessment: No/denies pain Pain Score: 8 (  with walking; 0 at rest but tender over gr troch) Pain Location: rt lateral hip Pain Descriptors / Indicators: Aching Pain Intervention(s): Limited activity within patient's tolerance;Monitored during session    Home Living Family/patient expects to be discharged to:: Private residence Living Arrangements: Children(23 yo, 31 yo) Available Help at Discharge: Family;Available PRN/intermittently Type of Home: House Home Access: Level entry     Home Layout: One  level Home Equipment: None      Prior Function Level of Independence: Independent         Comments: working full-time at [employer] (12 hr shifts; on concrete wearing steel toe boots)     Hand Dominance   Dominant Hand: Right    Extremity/Trunk Assessment   Upper Extremity Assessment Upper Extremity Assessment: Defer to OT evaluation(noted holds RUE in stiff position/does not swing in gait)    Lower Extremity Assessment Lower Extremity Assessment: RLE deficits/detail RLE Deficits / Details: slight decr ankle DF ROM compared to left; DF 4+, otherwise 5/5 RLE Sensation: WNL RLE Coordination: WNL    Cervical / Trunk Assessment Cervical / Trunk Assessment: Normal  Communication   Communication: No difficulties  Cognition Arousal/Alertness: Awake/alert Behavior During Therapy: WFL for tasks assessed/performed Overall Cognitive Status: Impaired/Different from baseline                                 General Comments: WFL during PT eval; see SLP assessment      General Comments General comments (skin integrity, edema, etc.): Assessed rt hip with pt in supine; point tenderness over greater trochanter; xray on admission showed bil hip degenerative changes. Discussed possible arthritis or bursitis inflammation brought on by fall and compounded by working 12 hr shifts on concrete floors. Recommend rest, ice, possibly topical anti-inflammatory if MD oks    Exercises     Assessment/Plan    PT Assessment All further PT needs can be met in the next venue of care(? will need ortho PT for rt hip)  PT Problem List Decreased strength;Decreased activity tolerance;Decreased range of motion;Decreased mobility;Pain       PT Treatment Interventions      PT Goals (Current goals can be found in the Care Plan section)  Acute Rehab PT Goals Patient Stated Goal: decr rt hip pain PT Goal Formulation: All assessment and education complete, DC therapy    Frequency      Barriers to discharge        Co-evaluation               AM-PAC PT "6 Clicks" Mobility  Outcome Measure Help needed turning from your back to your side while in a flat bed without using bedrails?: None Help needed moving from lying on your back to sitting on the side of a flat bed without using bedrails?: None Help needed moving to and from a bed to a chair (including a wheelchair)?: None Help needed standing up from a chair using your arms (e.g., wheelchair or bedside chair)?: None Help needed to walk in hospital room?: None Help needed climbing 3-5 steps with a railing? : None 6 Click Score: 24    End of Session Equipment Utilized During Treatment: Gait belt Activity Tolerance: Patient limited by pain Patient left: in bed;with call bell/phone within reach;with family/visitor present(Nursing has alarm off; allowing pt up in room I'ly) Nurse Communication: Mobility status;Other (comment)(rt hip pain) PT Visit Diagnosis: Pain;Other abnormalities of gait and mobility (R26.89) Pain -  Right/Left: Right Pain - part of body: Hip    Time: 7005-2591 PT Time Calculation (min) (ACUTE ONLY): 37 min   Charges:   PT Evaluation $PT Eval Low Complexity: 1 Low PT Treatments $Gait Training: 8-22 mins         Arby Barrette, PT Pager [phone]   Rexanne Mano 02/06/2020, 2:57 PM

## 2020-02-06 NOTE — Evaluation (Addendum)
Speech Language Pathology Evaluation Patient Details Name: Jacqueline Medina MRN: [id number] DOB: [birth year] Today's Date: 02/06/2020 Time: [id number] SLP Time Calculation (min) (ACUTE ONLY): 15 min  Problem List:  Patient Active Problem List   Diagnosis Date Noted  . CVA (cerebral vascular accident) ([place]) 02/05/2020  . Essential hypertension 02/05/2020  . Hyponatremia 02/05/2020  . Anxiety 02/05/2020   Past Medical History:  Past Medical History:  Diagnosis Date  . Hypertension    Past Surgical History: History reviewed. No pertinent surgical history. HPI:  Pt is a 49 y.o. right-handed female with medical history significant of hypertension who presented with weakness of her right hand starting 2 days prior to admission. 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  Subacute left frontal infarct.   Assessment / Plan / Recommendation Clinical Impression  Pt participated in speech/language/cognition evaluation with her boyfriend present for part of the evaluation. Pt denied any baseline deficits in speech, language, or cognition and initially stated that she was at baseline. However, upon completion of the evaluation she reported that she would not have performed as poorly prior to admission. [facility] Mental Status Examination was completed to evaluate the pt's cognitive-linguistic skills. She achieved a score of 17/30 which is below the normal limits of 27 or more out of 30. She exhibited deficits in the areas of awareness, attention, memory, mental manipulation, executive function, and complex problem solving. Skilled SLP services are clinically indicated at this time to improve cognitive-linguistic function.    SLP Assessment  SLP Recommendation/Assessment: Patient needs continued Speech Lanaguage Pathology Services SLP Visit Diagnosis: Cognitive communication deficit (R41.841)    Follow Up Recommendations  Other  (comment)(Continued SLP services at level of care recommended by PT/OT)    Frequency and Duration min 2x/week  2 weeks      SLP Evaluation Cognition  Overall Cognitive Status: Impaired/Different from baseline Arousal/Alertness: Awake/alert Orientation Level: Oriented to person;Oriented to place;Oriented to situation;Oriented to time Attention: Focused;Sustained Focused Attention: Impaired Focused Attention Impairment: Verbal complex Sustained Attention: Impaired Sustained Attention Impairment: Verbal complex Memory: Impaired Memory Impairment: Storage deficit;Retrieval deficit;Decreased recall of new information(Immediate: 5/5 with repetition; delayed: 4/5 with cue: 1/1) Awareness: Impaired Awareness Impairment: Emergent impairment Problem Solving: Impaired Problem Solving Impairment: Verbal complex Executive Function: Reasoning;Sequencing Reasoning: Impaired Reasoning Impairment: Verbal complex Sequencing: Impaired Sequencing Impairment: Verbal complex       Comprehension  Auditory Comprehension Overall Auditory Comprehension: Appears within functional limits for tasks assessed Yes/No Questions: Within Functional Limits Commands: Within Functional Limits Conversation: Complex Visual Recognition/Discrimination Discrimination: Within Function Limits Reading Comprehension Reading Status: Within funtional limits    Expression Expression Primary Mode of Expression: Verbal Verbal Expression Overall Verbal Expression: Appears within functional limits for tasks assessed Initiation: No impairment Automatic Speech: Counting;Day of week;Month of year(WNL) Level of Generative/Spontaneous Verbalization: Conversation Repetition: No impairment Naming: No impairment Pragmatics: No impairment Written Expression Dominant Hand: Right   Oral / Motor  Oral Motor/Sensory Function Overall Oral Motor/Sensory Function: Within functional limits Motor Speech Overall Motor Speech:  Appears within functional limits for tasks assessed Respiration: Within functional limits Phonation: Normal Resonance: Within functional limits Articulation: Within functional limitis Intelligibility: Intelligible Motor Planning: Witnin functional limits Motor Speech Errors: Not applicable   [name] I. Hardin Negus, [facility], [place] Office number [phone] Pager [place] 02/06/2020, 2:41 PM

## 2020-02-06 NOTE — Progress Notes (Addendum)
PROGRESS NOTE  Jacqueline Medina [id number] DOB: 04-17-1971 DOA: 02/05/2020 PCP: Patient, No Pcp Per  HPI/Recap of past 24 hours:  Jacqueline Medina is a 49 y.o. right-handed female with medical history significant of hypertension who presented with weakness of her right hand starting 2 days ago.  She noticed that she kept dropping things in her right hand and was unable to write.  Patient new what she wanted to say, but was having difficulty getting the words out.  Associated symptoms include intermittent complaints of palpitations and fall about 1 week ago with right hip pain.   Patient notes that she received her second Covid vaccine last Friday, but denied having any issues after receiving the vaccine.  She reports taking a daily aspirin, but also endorses taking oral contraceptive pills.  ED Course: CT scan of the brain revealed decreased attenuation in the left frontal lobe consistent with subacute ischemia.  Seen by neurology.  [facility] asked to admit for stroke workup.  02/06/20: Seen and examined at her bedside.  She states she feels better this morning.  Her aphasia has resolved.  Her right hand weakness is improving.  Ongoing stroke work-up.   Assessment/Plan: Principal Problem:   CVA (cerebral vascular accident) ([facility] de [facility]) Active Problems:   Essential hypertension   Hyponatremia   Anxiety    Multiple small acute infarcts in the right ACA territory on the left precentral gyrus/subacute left frontal infarct Suspected embolic source, symptomatology improving Findings seen on MRI brain without contrast.  Also showing moderately extensive chronic small vessel ischemic disease.  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 She was taking aspirin at home. Continue aspirin 81 mg daily and Plavix 75 mg daily x3 months then aspirin alone as recommended by neurology given severe intracranial artherosclerosis.  Continue Lipitor 40 mg  daily LDL 107, goal less than 70 Hemoglobin A1c 5.5, goal less than 7.0, goal 2D echo, PT OT/speech assessment pending Ongoing permissive hypertension Okay BP less than 220/120 in the next 24 to 48 hours. 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, adult type MRA and CTA suggested circle of willis multivessel stenosis and occlusion Planned cerebral angiogram by IR Will follow results C/w DAPT as recommended by neuro  Essential hypertension Ongoing permissive Hypertension as stated above Continue to hold off home antihypertensives  Chronic anxiety Stable Resume BuSpar  Resolved hypovolemic hyponatremia Serum sodium 136 Received normal saline IV fluid Encourage oral intake and DC IV fluids   DVT prophylaxis: Lovenox subcu daily Code Status: Full Family Communication: No family requested be updated at this time  Consults called: Neurology   Status is: Inpatient    Dispo: The patient is from: Home              Anticipated d/c is to: Home               Anticipated d/c date is: 02/07/20              Patient currently ongoing permissive hypertension.          Objective: Vitals:   02/05/20 2210 02/06/20 0008 02/06/20 0414 02/06/20 0856  BP: 124/87 114/69 128/88 123/77  Pulse: 87 81 87 (!) 104  Resp: 19 16 16 14   Temp: 98.9 F (37.2 C) 98.6 F (37 C) 98.7 F (37.1 C) 98.4 F (36.9 C)  TempSrc: Oral Oral Oral Oral  SpO2: 100% 100% 100% 100%  Weight:  Height:        Intake/Output Summary (Last 24 hours) at 02/06/2020 1125 Last data filed at 02/06/2020 0700 Gross per 24 hour  Intake 650.7 ml  Output --  Net 650.7 ml   Filed Weights   02/05/20 2036  Weight: 70 kg    Exam:  . General: 49 y.o. year-old female well developed well nourished in no acute distress.  Alert and oriented x3. . Cardiovascular: Regular rate and rhythm with no rubs or gallops.   Marland Kitchen Respiratory: Clear to auscultation with no wheezes or rales. . Abdomen: Soft nontender  nondistended with normal bowel sounds. . Musculoskeletal: No lower extremity edema.  Equal strength in all 4 extremities.  Sensory is intact. Marland Kitchen Psychiatry: Mood is appropriate for condition and setting   Data Reviewed: CBC: Recent Labs  Lab 02/05/20 1228 02/05/20 1259  WBC 10.0  --   NEUTROABS 7.0  --   HGB 14.5 14.6  HCT 42.9 43.0  MCV 91.3  --   PLT 301  --    Basic Metabolic Panel: Recent Labs  Lab 02/05/20 1228 02/05/20 1259  NA 133* 136  K 4.4 4.4  CL 97* 99  CO2 27  --   GLUCOSE 89 83  BUN 18 21*  CREATININE 1.12* 1.10*  CALCIUM 8.5*  --    GFR: Estimated Creatinine Clearance: 57.3 mL/min (A) (by C-G formula based on SCr of 1.1 mg/dL (H)). Liver Function Tests: Recent Labs  Lab 02/05/20 1228  AST 17  ALT 18  ALKPHOS 84  BILITOT 1.0  PROT 6.6  ALBUMIN 3.2*   No results for input(s): LIPASE, AMYLASE in the last 168 hours. No results for input(s): AMMONIA in the last 168 hours. Coagulation Profile: Recent Labs  Lab 02/05/20 1228  INR 1.0   Cardiac Enzymes: No results for input(s): CKTOTAL, CKMB, CKMBINDEX, TROPONINI in the last 168 hours. BNP (last 3 results) No results for input(s): PROBNP in the last 8760 hours. HbA1C: Recent Labs    02/06/20 0434  HGBA1C 5.5   CBG: No results for input(s): GLUCAP in the last 168 hours. Lipid Profile: Recent Labs    02/06/20 0434  CHOL 203*  HDL 42  LDLCALC 107*  TRIG 270*  CHOLHDL 4.8   Thyroid Function Tests: No results for input(s): TSH, T4TOTAL, FREET4, T3FREE, THYROIDAB in the last 72 hours. Anemia Panel: No results for input(s): VITAMINB12, FOLATE, FERRITIN, TIBC, IRON, RETICCTPCT in the last 72 hours. Urine analysis: No results found for: COLORURINE, APPEARANCEUR, LABSPEC, PHURINE, GLUCOSEU, HGBUR, BILIRUBINUR, KETONESUR, PROTEINUR, UROBILINOGEN, NITRITE, LEUKOCYTESUR Sepsis Labs: @LABRCNTIP (procalcitonin:4,lacticidven:4)  ) Recent Results (from the past 240 hour(s))  SARS CORONAVIRUS 2  (TAT 6-24 HRS) Nasopharyngeal Nasopharyngeal Swab     Status: None   Collection Time: 02/05/20  7:15 PM   Specimen: Nasopharyngeal Swab  Result Value Ref Range Status   SARS Coronavirus 2 NEGATIVE NEGATIVE Final    Comment: (NOTE) SARS-CoV-2 target nucleic acids are NOT DETECTED. The SARS-CoV-2 RNA is generally detectable in upper and lower respiratory specimens during the acute phase of infection. Negative results do not preclude SARS-CoV-2 infection, do not rule out co-infections with other pathogens, and should not be used as the sole basis for treatment or other patient management decisions. Negative results must be combined with clinical observations, patient history, and epidemiological information. The expected result is Negative. Fact Sheet for Patients: SugarRoll.be Fact Sheet for Healthcare Providers: https://www.woods-mathews.com/ This test is not yet approved or cleared by the [place] FDA and  has been authorized for detection and/or diagnosis of SARS-CoV-2 by FDA under an Emergency Use Authorization (EUA). This EUA will remain  in effect (meaning this test can be used) for the duration of the COVID-19 declaration under Section 56 4(b)(1) of the Act, 21 U.S.C. section 360bbb-3(b)(1), unless the authorization is terminated or revoked sooner. Performed at [facility], [place] [address] [place], [place] [address]       Studies: CT ANGIO HEAD W OR WO CONTRAST  Addendum Date: 02/06/2020   ADDENDUM REPORT: 02/06/2020 10:10 ADDENDUM: The impression should also include note of abnormal and somewhat unusual appearing bilateral level 2 cervical lymph nodes. The nodes are slightly enlarged, enhance more than normal and show some internal heterogeneity. The nature of this is unclear. There is no older study for comparison. The single largest level 2 node on the left measures 20 x 12 x 10 mm, only mildly enlarged. These may simply  be reactive to some systemic or head and neck inflammatory process. Electronically Signed   By: Nelson Chimes M.D.   On: 02/06/2020 10:10   Result Date: 02/06/2020 CLINICAL DATA:  Aphasia. Right arm and leg weakness. MRI showed acute right ACA territory strokes and left frontal stroke. EXAM: CT ANGIOGRAPHY HEAD AND NECK TECHNIQUE: Multidetector CT imaging of the head and neck was performed using the standard protocol during bolus administration of intravenous contrast. Multiplanar CT image reconstructions and MIPs were obtained to evaluate the vascular anatomy. Carotid stenosis measurements (when applicable) are obtained utilizing NASCET criteria, using the distal internal carotid diameter as the denominator. CONTRAST:  34mL OMNIPAQUE IOHEXOL 350 MG/ML SOLN COMPARISON:  MRI yesterday. FINDINGS: CTA NECK FINDINGS Aortic arch: Aorta appears normal without visible atherosclerotic change. Branching pattern is normal without origin stenosis. Right carotid system: Common carotid artery widely patent to the bifurcation. Carotid bifurcation is normal without soft or calcified plaque. Cervical ICA is normal. Left carotid system: Common carotid artery widely patent to the bifurcation. Carotid bifurcation is normal. Narrowing of the left ICA beginning at the distal bulb and extending through the skull base. This is secondary to out flow restriction. See below. Vertebral arteries: Both vertebral artery origins widely patent. Both vertebral arteries normal through the cervical region. Skeleton: Normal Other neck: Abnormal appearing bilateral level 2 nodes showing heterogeneous nature and enhancement. Petra Kuba of this is uncertain. Largest node is a left level 2 node measuring 20 x 12 x 10 mm. Upper chest: Normal Review of the MIP images confirms the above findings CTA HEAD FINDINGS Anterior circulation: Right internal carotid artery is patent through the skull base. The vessel is narrowed in the siphon region, right anterior  cerebral artery is either occluded or extremely small. Left ICA is a very small vessel, only 1 mm, patent through the skull base and siphon region but occluded at the supraclinoid ICA level. There is collateral or reconstituted flow in the left MCA territory. Less vigorous reconstituted or collateral flow in the left ACA territory region. Posterior circulation: Both vertebral arteries are patent to the basilar. No basilar stenosis. Posterior circulation branch vessels appear patent and normal. Venous sinuses: Patent and normal. Anatomic variants: None significant. Review of the MIP images confirms the above findings IMPRESSION: No sign of atherosclerotic vascular disease. Left supraclinoid ICA occlusion. Reconstituted/collateral supply visible in the anterior and middle cerebral territories, with some deficiency in the left frontal region. Distal right ICA is abnormally small but does remain patent, supplying the right middle cerebral artery territory. Occlusion or severe  stenosis of the right ACA. Reconstituted/collateral distal flow. The differential diagnosis in this case is that of moyamoya disease versus is some other sort of vasculitis. 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 Electronically Signed: By: Nelson Chimes M.D. On: 02/06/2020 08:49   CT HEAD WO CONTRAST  Addendum Date: 02/05/2020   ADDENDUM REPORT: 02/05/2020 14:53 ADDENDUM: The findings and impression should read left frontal lobe not right. Electronically Signed   By: Inez Catalina M.D.   On: 02/05/2020 14:53   Result Date: 02/05/2020 CLINICAL DATA:  Right hand weakness for 2 days, initial encounter EXAM: CT HEAD WITHOUT CONTRAST TECHNIQUE: Contiguous axial images were obtained from the base of the skull through the vertex without intravenous contrast. COMPARISON:  None. FINDINGS: Brain: There is an area of geographic decreased attenuation identified in the right frontal lobe near the vertex consistent with subacute ischemia. No  other focal infarct is seen. No hemorrhage or mass lesion is noted. Vascular: No hyperdense vessel or unexpected calcification. Skull: Normal. Negative for fracture or focal lesion. Sinuses/Orbits: No acute finding. Other: None. IMPRESSION: Area of decreased attenuation in the right frontal lobe consistent with subacute ischemia. No other focal abnormality is noted. Electronically Signed: By: Inez Catalina M.D. On: 02/05/2020 13:10   CT ANGIO NECK W OR WO CONTRAST  Addendum Date: 02/06/2020   ADDENDUM REPORT: 02/06/2020 10:10 ADDENDUM: The impression should also include note of abnormal and somewhat unusual appearing bilateral level 2 cervical lymph nodes. The nodes are slightly enlarged, enhance more than normal and show some internal heterogeneity. The nature of this is unclear. There is no older study for comparison. The single largest level 2 node on the left measures 20 x 12 x 10 mm, only mildly enlarged. These may simply be reactive to some systemic or head and neck inflammatory process. Electronically Signed   By: Nelson Chimes M.D.   On: 02/06/2020 10:10   Result Date: 02/06/2020 CLINICAL DATA:  Aphasia. Right arm and leg weakness. MRI showed acute right ACA territory strokes and left frontal stroke. EXAM: CT ANGIOGRAPHY HEAD AND NECK TECHNIQUE: Multidetector CT imaging of the head and neck was performed using the standard protocol during bolus administration of intravenous contrast. Multiplanar CT image reconstructions and MIPs were obtained to evaluate the vascular anatomy. Carotid stenosis measurements (when applicable) are obtained utilizing NASCET criteria, using the distal internal carotid diameter as the denominator. CONTRAST:  54mL OMNIPAQUE IOHEXOL 350 MG/ML SOLN COMPARISON:  MRI yesterday. FINDINGS: CTA NECK FINDINGS Aortic arch: Aorta appears normal without visible atherosclerotic change. Branching pattern is normal without origin stenosis. Right carotid system: Common carotid artery widely  patent to the bifurcation. Carotid bifurcation is normal without soft or calcified plaque. Cervical ICA is normal. Left carotid system: Common carotid artery widely patent to the bifurcation. Carotid bifurcation is normal. Narrowing of the left ICA beginning at the distal bulb and extending through the skull base. This is secondary to out flow restriction. See below. Vertebral arteries: Both vertebral artery origins widely patent. Both vertebral arteries normal through the cervical region. Skeleton: Normal Other neck: Abnormal appearing bilateral level 2 nodes showing heterogeneous nature and enhancement. Petra Kuba of this is uncertain. Largest node is a left level 2 node measuring 20 x 12 x 10 mm. Upper chest: Normal Review of the MIP images confirms the above findings CTA HEAD FINDINGS Anterior circulation: Right internal carotid artery is patent through the skull base. The vessel is narrowed in the siphon region, right anterior cerebral artery  is either occluded or extremely small. Left ICA is a very small vessel, only 1 mm, patent through the skull base and siphon region but occluded at the supraclinoid ICA level. There is collateral or reconstituted flow in the left MCA territory. Less vigorous reconstituted or collateral flow in the left ACA territory region. Posterior circulation: Both vertebral arteries are patent to the basilar. No basilar stenosis. Posterior circulation branch vessels appear patent and normal. Venous sinuses: Patent and normal. Anatomic variants: None significant. Review of the MIP images confirms the above findings IMPRESSION: No sign of atherosclerotic vascular disease. Left supraclinoid ICA occlusion. Reconstituted/collateral supply visible in the anterior and middle cerebral territories, with some deficiency in the left frontal region. Distal right ICA is abnormally small but does remain patent, supplying the right middle cerebral artery territory. Occlusion or severe stenosis of the  right ACA. Reconstituted/collateral distal flow. The differential diagnosis in this case is that of moyamoya disease versus is some other sort of vasculitis. 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 Electronically Signed: By: Nelson Chimes M.D. On: 02/06/2020 08:49   MR ANGIO HEAD WO CONTRAST  Result Date: 02/05/2020 CLINICAL DATA:  Mild aphasia. Transient right arm and leg weakness and left face numbness. EXAM: MRI HEAD WITHOUT CONTRAST MRA HEAD WITHOUT CONTRAST TECHNIQUE: Multiplanar, multiecho pulse sequences of the brain and surrounding structures were obtained without intravenous contrast. Angiographic images of the head were obtained using MRA technique without contrast. COMPARISON:  Head CT 02/05/2020 FINDINGS: MRI HEAD FINDINGS Brain: There are multiple punctate acute infarcts involving medial right frontal lobe cortex and subcortical white matter (ACA territory), right periatrial white matter, and left precentral gyrus. Additionally, there is a more confluent region of milder diffusion weighted signal abnormality in the left frontal lobe involving cortex and white matter measuring approximately 3 cm in size with T2 hyperintensity, facilitated diffusion, and petechial hemorrhage compatible with a subacute infarct and corresponding to the abnormality on CT. T2 hyperintensities elsewhere in the cerebral white matter bilaterally are nonspecific but compatible with moderately age advanced chronic small vessel ischemic disease. There is also a late subacute to chronic left basal ganglia infarct with chronic blood products. The ventricles and sulci are normal. No mass, midline shift, or extra-axial fluid collection is identified. Vascular: Abnormal appearance of the left ICA, more fully evaluated below. Skull and upper cervical spine: Unremarkable bone marrow signal. Sinuses/Orbits: Unremarkable orbits. Paranasal sinuses and mastoid air cells are clear. Other: None. MRA HEAD FINDINGS The visualized  distal to the basilar and codominant vertebral arteries are widely patent. Patent PICAs, AICAs, and SCAs are seen bilaterally. The basilar artery is widely patent. There are medium sized posterior communicating arteries bilaterally with diminished or absent flow related enhancement proximally on the left. The PCAs are patent without evidence of significant proximal stenosis. The intracranial right internal carotid artery is patent with severe tandem stenosis involving the distal supraclinoid segment and terminus. The intracranial left ICA is patent proximally but small and is occluded from the proximal supraclinoid segment through the terminus. The left MCA and both ACAs are occluded. The right MCA is patent with a severe stenosis at its origin. No aneurysm is identified. IMPRESSION: 1. 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 2. Subacute left frontal infarct. 3. Moderately extensive chronic small vessel ischemic disease. 4. 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 Electronically Signed  By: Logan Bores M.D.   On: 02/05/2020 19:08   MR BRAIN WO CONTRAST  Result Date: 02/05/2020 CLINICAL DATA:  Mild aphasia. Transient right arm and leg weakness and left face numbness. EXAM: MRI HEAD WITHOUT CONTRAST MRA HEAD WITHOUT CONTRAST TECHNIQUE: Multiplanar, multiecho pulse sequences of the brain and surrounding structures were obtained without intravenous contrast. Angiographic images of the head were obtained using MRA technique without contrast. COMPARISON:  Head CT 02/05/2020 FINDINGS: MRI HEAD FINDINGS Brain: There are multiple punctate acute infarcts involving medial right frontal lobe cortex and subcortical white matter (ACA territory), right periatrial white matter, and left precentral gyrus. Additionally, there is a more confluent region of milder diffusion weighted signal  abnormality in the left frontal lobe involving cortex and white matter measuring approximately 3 cm in size with T2 hyperintensity, facilitated diffusion, and petechial hemorrhage compatible with a subacute infarct and corresponding to the abnormality on CT. T2 hyperintensities elsewhere in the cerebral white matter bilaterally are nonspecific but compatible with moderately age advanced chronic small vessel ischemic disease. There is also a late subacute to chronic left basal ganglia infarct with chronic blood products. The ventricles and sulci are normal. No mass, midline shift, or extra-axial fluid collection is identified. Vascular: Abnormal appearance of the left ICA, more fully evaluated below. Skull and upper cervical spine: Unremarkable bone marrow signal. Sinuses/Orbits: Unremarkable orbits. Paranasal sinuses and mastoid air cells are clear. Other: None. MRA HEAD FINDINGS The visualized distal to the basilar and codominant vertebral arteries are widely patent. Patent PICAs, AICAs, and SCAs are seen bilaterally. The basilar artery is widely patent. There are medium sized posterior communicating arteries bilaterally with diminished or absent flow related enhancement proximally on the left. The PCAs are patent without evidence of significant proximal stenosis. The intracranial right internal carotid artery is patent with severe tandem stenosis involving the distal supraclinoid segment and terminus. The intracranial left ICA is patent proximally but small and is occluded from the proximal supraclinoid segment through the terminus. The left MCA and both ACAs are occluded. The right MCA is patent with a severe stenosis at its origin. No aneurysm is identified. IMPRESSION: 1. 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 2. Subacute left frontal infarct. 3. Moderately extensive chronic small vessel ischemic disease. 4. Occlusion of the distal left ICA, left MCA, and both ACAs  with severe tandem stenoses of the distal right ICA (Moyamoya pattern). Electronically Signed   By: Logan Bores M.D.   On: 02/05/2020 19:08   ECHOCARDIOGRAM COMPLETE  Result Date: 02/06/2020    ECHOCARDIOGRAM REPORT   Patient Name:   Janal HUNT Date of Exam: 02/06/2020 Medical Rec #:  [id number]   Height:       66.0 in Accession #:    [id number]  Weight:       154.3 lb Date of Birth:  [birth year]   BSA:          1.791 m Patient Age:    38 years    BP:           123/77 mmHg Patient Gender: F           HR:           104 bpm. Exam Location:  Inpatient Procedure: 2D Echo, Cardiac Doppler and Color Doppler Indications:    Stroke  History:        Patient has no prior history of Echocardiogram examinations.  Risk Factors:Hypertension.  Sonographer:    Clayton Lefort RDCS (AE) Referring Phys: [id number] RONDELL A SMITH IMPRESSIONS  1. Left ventricular ejection fraction, by estimation, is 60 to 65%. The left ventricle has normal function. The left ventricle has no regional wall motion abnormalities. There is mild asymmetric left ventricular hypertrophy of the basal and septal segments. Left ventricular diastolic parameters are consistent with Grade I diastolic dysfunction (impaired relaxation).  2. Right ventricular systolic function is normal. The right ventricular size is normal. There is normal pulmonary artery systolic pressure.  3. The mitral valve is normal in structure. Trivial mitral valve regurgitation. No evidence of mitral stenosis.  4. The aortic valve is tricuspid. Aortic valve regurgitation is not visualized. Mild aortic valve sclerosis is present, with no evidence of aortic valve stenosis.  5. The inferior vena cava is normal in size with greater than 50% respiratory variability, suggesting right atrial pressure of 3 mmHg. FINDINGS  Left Ventricle: Left ventricular ejection fraction, by estimation, is 60 to 65%. The left ventricle has normal function. The left ventricle has no regional wall motion  abnormalities. The left ventricular internal cavity size was normal in size. There is  mild asymmetric left ventricular hypertrophy of the basal and septal segments. Left ventricular diastolic parameters are consistent with Grade I diastolic dysfunction (impaired relaxation). Right Ventricle: The right ventricular size is normal. No increase in right ventricular wall thickness. Right ventricular systolic function is normal. There is normal pulmonary artery systolic pressure. The tricuspid regurgitant velocity is 2.22 m/s, and  with an assumed right atrial pressure of 8 mmHg, the estimated right ventricular systolic pressure is [id number] mmHg. Left Atrium: Left atrial size was normal in size. Right Atrium: Right atrial size was normal in size. Pericardium: There is no evidence of pericardial effusion. Mitral Valve: The mitral valve is normal in structure. There is mild thickening of the mitral valve leaflet(s). There is mild calcification of the mitral valve leaflet(s). Normal mobility of the mitral valve leaflets. Trivial mitral valve regurgitation. No evidence of mitral valve stenosis. MV peak gradient, 9.7 mmHg. The mean mitral valve gradient is 4.0 mmHg. Tricuspid Valve: The tricuspid valve is normal in structure. Tricuspid valve regurgitation is trivial. No evidence of tricuspid stenosis. Aortic Valve: The aortic valve is tricuspid. Aortic valve regurgitation is not visualized. Mild aortic valve sclerosis is present, with no evidence of aortic valve stenosis. Aortic valve mean gradient measures 7.0 mmHg. Aortic valve peak gradient measures 12.8 mmHg. Aortic valve area, by VTI measures 1.76 cm. Pulmonic Valve: The pulmonic valve was normal in structure. Pulmonic valve regurgitation is trivial. No evidence of pulmonic stenosis. Aorta: The aortic root is normal in size and structure. Venous: The inferior vena cava is normal in size with greater than 50% respiratory variability, suggesting right atrial pressure of 3  mmHg. IAS/Shunts: No atrial level shunt detected by color flow Doppler.  LEFT VENTRICLE PLAX 2D LVIDd:         3.50 cm  Diastology LVIDs:         2.20 cm  LV e' lateral:   9.68 cm/s LV PW:         1.20 cm  LV E/e' lateral: 9.7 LV IVS:        1.40 cm  LV e' medial:    6.96 cm/s LVOT diam:     1.60 cm  LV E/e' medial:  13.5 LV SV:         51 LV SV Index:   29 LVOT Area:  2.01 cm  RIGHT VENTRICLE             IVC RV Basal diam:  3.40 cm     IVC diam: 1.20 cm RV S prime:     12.20 cm/s TAPSE (M-mode): 2.2 cm LEFT ATRIUM           Index       RIGHT ATRIUM           Index LA diam:      3.10 cm 1.73 cm/m  RA Area:     10.60 cm LA Vol (A4C): 39.9 ml 22.28 ml/m RA Volume:   23.10 ml  12.90 ml/m  AORTIC VALVE AV Area (Vmax):    1.61 cm AV Area (Vmean):   1.48 cm AV Area (VTI):     1.76 cm AV Vmax:           179.00 cm/s AV Vmean:          122.000 cm/s AV VTI:            0.290 m AV Peak Grad:      12.8 mmHg AV Mean Grad:      7.0 mmHg LVOT Vmax:         143.00 cm/s LVOT Vmean:        89.500 cm/s LVOT VTI:          0.254 m LVOT/AV VTI ratio: 0.88  AORTA Ao Root diam: 2.70 cm Ao Asc diam:  2.90 cm MITRAL VALVE                TRICUSPID VALVE MV Area (PHT): 4.63 cm     TR Peak grad:   19.7 mmHg MV Peak grad:  9.7 mmHg     TR Vmax:        222.00 cm/s MV Mean grad:  4.0 mmHg MV Vmax:       1.56 m/s     SHUNTS MV Vmean:      86.8 cm/s    Systemic VTI:  0.25 m MV Decel Time: 164 msec     Systemic Diam: 1.60 cm MV E velocity: 94.30 cm/s MV A velocity: 137.00 cm/s MV E/A ratio:  0.69 Jenkins Rouge MD Electronically signed by Jenkins Rouge MD Signature Date/Time: 02/06/2020/10:18:54 AM    Final    DG Hip Unilat W or Wo Pelvis 2-3 Views Right  Result Date: 02/05/2020 CLINICAL DATA:  Fall 1 week ago with persistent right hip pain, initial encounter EXAM: DG HIP (WITH OR WITHOUT PELVIS) 2-3V RIGHT COMPARISON:  None. FINDINGS: Pelvic ring is intact. Mild degenerative changes of the hip joints are noted bilaterally. No acute  fracture or dislocation is seen. No soft tissue abnormality is noted. IMPRESSION: Degenerative change without acute abnormality. Electronically Signed   By: Inez Catalina M.D.   On: 02/05/2020 15:14    Scheduled Meds: .  stroke: mapping our early stages of recovery book      . aspirin  300 mg Rectal Daily   Or  . aspirin  325 mg Oral Daily  . atorvastatin  40 mg Oral Daily  . busPIRone  5 mg Oral BID  . clopidogrel  75 mg Oral Daily  . enoxaparin (LOVENOX) injection  40 mg Subcutaneous Q24H  . sodium chloride flush  3 mL Intravenous Once    Continuous Infusions: . sodium chloride Stopped (02/06/20 0005)  . sodium chloride Stopped (02/06/20 0005)     LOS: 1 day     Kayleen Memos,  MD [facility] Pager [phone]  If 7PM-7AM, please contact night-coverage [email or web address] Password [facility] 02/06/2020, 11:25 AM

## 2020-02-06 NOTE — Progress Notes (Signed)
  Speech Language Pathology Treatment: Cognitive-Linquistic  Patient Details Name: Jacqueline Medina MRN: [id number] DOB: 12/27/1970 Today's Date: 02/06/2020 Time: 1211-1227 SLP Time Calculation (min) (ACUTE ONLY): 16 min  Assessment / Plan / Recommendation Clinical Impression  Pt was seen for cognitive-linguistic treatment and was cooperative throughout the session. She demonstrated 80% accuracy with problem solving related to safety. She completed time management and medication management tasks with 100% accuracy. She achieved 100% accuracy with recall of concrete information from voicemails but 75% accuracy with reasoning related to voicemails. She completed a 3-item mental manipulation sequencing task with 75% accuracy increasing to 100% with cues. At the four-item level she achieved 67% accuracy when verbal prompts were provided. SLP will continue to follow pt.    HPI HPI: Pt is a 49 y.o. right-handed female with medical history significant of hypertension who presented with weakness of her right hand starting 2 days prior to admission. 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  Subacute left frontal infarct.      SLP Plan  Continue with current plan of care  Patient needs continued Speech Lanaguage Pathology Services    Recommendations                   Follow up Recommendations: Other (comment)(Continued SLP services at level of care recommended by PT/OT) SLP Visit Diagnosis: Cognitive communication deficit [id number]) Plan: Continue with current plan of care       Jacqueline Medina I. Hardin Negus, [place], [place] Office number [phone] Pager [phone]               Jacqueline Medina 02/06/2020, 2:45 PM

## 2020-02-06 NOTE — Progress Notes (Signed)
STROKE TEAM PROGRESS NOTE   INTERVAL HISTORY Her husband is at the bedside.  Pt lying in bed, not in distress. Neuro intact. 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 Discussed with Dr. Estanislado Pandy over the phone, will try to fit her for cerebral angiogram in am.   OBJECTIVE Vitals:   02/06/20 0008 02/06/20 0414 02/06/20 0856 02/06/20 1228  BP: 114/69 128/88 123/77 134/85  Pulse: 81 87 (!) 104 89  Resp: 16 16 14 16   Temp: 98.6 F (37 C) 98.7 F (37.1 C) 98.4 F (36.9 C) 99.2 F (37.3 C)  TempSrc: Oral Oral Oral Oral  SpO2: 100% 100% 100% 100%  Weight:      Height:        CBC:  Recent Labs  Lab 02/05/20 1228 02/05/20 1259  WBC 10.0  --   NEUTROABS 7.0  --   HGB 14.5 14.6  HCT 42.9 43.0  MCV 91.3  --   PLT 301  --     Basic Metabolic Panel:  Recent Labs  Lab 02/05/20 1228 02/05/20 1259  NA 133* 136  K 4.4 4.4  CL 97* 99  CO2 27  --   GLUCOSE 89 83  BUN 18 21*  CREATININE 1.12* 1.10*  CALCIUM 8.5*  --     Lipid Panel:     Component Value Date/Time   CHOL 203 (H) 02/06/2020 0434   TRIG 270 (H) 02/06/2020 0434   HDL 42 02/06/2020 0434   CHOLHDL 4.8 02/06/2020 0434   VLDL 54 (H) 02/06/2020 0434   LDLCALC 107 (H) 02/06/2020 0434   HgbA1c:  Lab Results  Component Value Date   HGBA1C 5.5 02/06/2020   Urine Drug Screen: No results found for: LABOPIA, COCAINSCRNUR, LABBENZ, AMPHETMU, THCU, LABBARB  Alcohol Level No results found for: ETH  IMAGING  CT ANGIO HEAD W OR WO CONTRAST CT ANGIO NECK W OR WO CONTRAST 02/06/2020   ADDENDUM:  The impression should also include note of abnormal and somewhat unusual appearing bilateral level 2 cervical lymph nodes. The nodes are slightly enlarged, enhance more than normal and show some internal heterogeneity. The nature of this is unclear. There is no older study for comparison. The single largest level 2 node on the left measures 20 x 12 x 10 mm, only mildly enlarged. These may simply be reactive to some systemic or  head and neck inflammatory process.  IMPRESSION:  No sign of atherosclerotic vascular disease. Left supraclinoid ICA occlusion. Reconstituted/collateral supply visible in the anterior and middle cerebral territories, with some deficiency in the left frontal region. Distal right ICA is abnormally small but does remain patent, supplying the right middle cerebral artery territory. Occlusion or severe stenosis of the right ACA. Reconstituted/collateral distal flow. The differential diagnosis in this case is that of moyamoya disease versus is some other sort of vasculitis. 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   CT HEAD WO CONTRAST 02/05/2020   ADDENDUM  The findings and impression should read left frontal lobe not right.  IMPRESSION:  Area of decreased attenuation in the right frontal lobe consistent with subacute ischemia. No other focal abnormality is noted.   MR BRAIN WO CONTRAST MR ANGIO HEAD WO CONTRAST 02/05/2020 IMPRESSION:  1. 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  2. Subacute left frontal infarct.  3. Moderately extensive chronic small vessel ischemic disease.  4. Occlusion of the distal left ICA, left MCA, and both ACAs with severe tandem stenoses  of the distal right ICA (Moyamoya pattern).   ECHOCARDIOGRAM COMPLETE 02/06/2020 IMPRESSIONS   1. Left ventricular ejection fraction, by estimation, is 60 to 65%. The left ventricle has normal function. The left ventricle has no regional wall motion abnormalities. There is mild asymmetric left ventricular hypertrophy of the basal and septal segments. Left ventricular diastolic parameters are consistent with Grade I diastolic dysfunction (impaired relaxation).   2. Right ventricular systolic function is normal. The right ventricular size is normal. There is normal pulmonary artery systolic pressure.   3. The mitral valve is normal in structure. Trivial mitral valve regurgitation. No  evidence of mitral stenosis.   4. The aortic valve is tricuspid. Aortic valve regurgitation is not visualized. Mild aortic valve sclerosis is present, with no evidence of aortic valve stenosis.   5. The inferior vena cava is normal in size with greater than 50% respiratory variability, suggesting right atrial pressure of 3 mmHg.   DG Hip Unilat W or Wo Pelvis 2-3 Views Right 02/05/2020 IMPRESSION:  Degenerative change without acute abnormality.   ECG - SR rate 71 BPM. (See cardiology reading for complete details)   PHYSICAL EXAM  Temp:  [98.2 F (36.8 C)-99.2 F (37.3 C)] 99.2 F (37.3 C) (05/16 1228) Pulse Rate:  [80-104] 89 (05/16 1228) Resp:  [14-19] 16 (05/16 1228) BP: (114-136)/(69-88) 134/85 (05/16 1228) SpO2:  [100 %] 100 % (05/16 1228) Weight:  [70 kg] 70 kg (05/15 2036)  General - Well nourished, well developed, in no apparent distress.  Ophthalmologic - fundi not visualized due to noncooperation.  Cardiovascular - Regular rhythm and rate.  Mental Status -  Level of arousal and orientation to time, place, and person were intact. Language including expression, naming, repetition, comprehension was assessed and found intact. Attention span and concentration were impaired, not able to backward spelling WORLD. Recent and remote memory were 3/3 registration but 0/3 delayed recall Fund of Knowledge was assessed and was impaired with past presidents.  Cranial Nerves II - XII - II - Visual field intact OU. III, IV, VI - Extraocular movements intact. V - Facial sensation intact bilaterally. VII - Facial movement intact bilaterally. VIII - Hearing & vestibular intact bilaterally. X - Palate elevates symmetrically. XI - Chin turning & shoulder shrug intact bilaterally. XII - Tongue protrusion intact.  Motor Strength - The patient's strength was normal in all extremities and pronator drift was absent.  Bulk was normal and fasciculations were absent.   Motor Tone - Muscle  tone was assessed at the neck and appendages and was normal.  Reflexes - The patient's reflexes were symmetrical in all extremities and she had no pathological reflexes.  Sensory - Light touch, temperature/pinprick were assessed and were symmetrical.    Coordination - The patient had normal movements in the hands with no ataxia or dysmetria.  Tremor was absent.  Gait and Station - deferred.   ASSESSMENT/PLAN Ms. Alexiea Rossetti is a 49 y.o. female with history of hypertension presenting with left arm and facial numbness / weakness. 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 Less likely vasculitis given large vessel involvement  CT head - Area of decreased attenuation in the left frontal lobe consistent with subacute ischemia.  MRI head - Multiple small acute infarcts in the right ACA territory and left precentral gyrus.  Subacute left frontal infarct.   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   CTA H&N - No sign of atherosclerotic vascular disease. Left supraclinoid ICA occlusion. Reconstituted/collateral supply visible in the anterior and middle cerebral territories, with some deficiency in the left frontal region. Distal right ICA is abnormally small but does remain patent, supplying the right middle cerebral artery territory. Occlusion or severe stenosis of the right ACA.   2D Echo - EF 60 - 65%. No cardiac source of emboli identified.   [employer] Virus 2 - negative  LDL - 107  HgbA1c - 5.5  UDS - not ordered  VTE prophylaxis - Lovenox  aspirin 81 mg daily prior to admission, now on aspirin 325 mg daily and clopidogrel 75 mg daily DAPT for 3 months and then ASA alone given  intracranial stenosis  Patient counseled to be compliant with her antithrombotic medications  Ongoing aggressive stroke risk factor management  Therapy recommendations:  pending  Disposition:  Pending  ?? 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 Estanislado Pandy who is also pt sister's interventionist.   NPO since midnight  On DAPT  DDx including CNS vasculitis - however the large vessel involvement make it less likely - will check autoimmune labs.   Hypertension  Home BP meds: Zestril ; HCTZ ; metoprolol  Current BP meds: none   Stable . Permissive hypertension (OK if < 220/120) but gradually normalize in 5-7 days  . Long-term BP goal 130-150  Hyperlipidemia  Home Lipid lowering medication: none  LDL 107, goal < 70  Current lipid lowering medication: Lipitor 40 mg daily   Continue statin at discharge  Other Stroke Risk Factors  Previous ETOH use.  Other Active Problems  Code status - Full code  Abnormal and somewhat unusual appearing bilateral level 2 cervical lymph nodes. The nodes are slightly enlarged, enhance more than normal and show some internal heterogeneity. The nature of this is unclear. There is no older study for comparison. The single largest level 2 node on the left measures 20 x 12 x 10 mm, only mildly enlarged. These may simply be reactive to some systemic or head and neck inflammatory process by CTA.   Hospital day # 1  Rosalin Hawking, MD PhD Stroke Neurology 02/06/2020 2:12 PM    To contact Stroke Continuity provider, please refer to [email or web address] After hours, contact General Neurology

## 2020-02-06 NOTE — Progress Notes (Signed)
  Echocardiogram 2D Echocardiogram has been performed.  Jacqueline Medina 02/06/2020, 10:04 AM

## 2020-02-07 ENCOUNTER — Encounter (HOSPITAL_COMMUNITY): Payer: Self-pay | Admitting: Internal Medicine

## 2020-02-07 LAB — ANCA TITERS
Atypical P-ANCA titer: <1:20 {titer}
C-ANCA: <1:20 {titer}
P-ANCA: <1:20 {titer}

## 2020-02-07 LAB — MPO/PR-3 (ANCA) ANTIBODIES
ANCA Proteinase 3: <3.5 U/mL (ref 0.0–3.5)
Myeloperoxidase Abs: <9 U/mL (ref 0.0–9.0)

## 2020-02-07 LAB — ANTI-DNA ANTIBODY, DOUBLE-STRANDED: ds DNA Ab: <1 IU/mL (ref 0–9)

## 2020-02-07 LAB — ANTINUCLEAR ANTIBODIES, IFA: ANA Ab, IFA: NEGATIVE

## 2020-02-07 LAB — SJOGRENS SYNDROME-B EXTRACTABLE NUCLEAR ANTIBODY: SSB (La) (ENA) Antibody, IgG: <0.2 AI (ref 0.0–0.9)

## 2020-02-07 LAB — RHEUMATOID FACTOR: Rheumatoid fact SerPl-aCnc: <10 IU/mL (ref 0.0–13.9)

## 2020-02-07 LAB — SJOGRENS SYNDROME-A EXTRACTABLE NUCLEAR ANTIBODY: SSA (Ro) (ENA) Antibody, IgG: <0.2 AI (ref 0.0–0.9)

## 2020-02-07 MED ORDER — METOPROLOL TARTRATE 25 MG PO TABS: 25.0000 mg | ORAL_TABLET | Freq: Two times a day (BID) | ORAL | Status: DC

## 2020-02-07 MED ORDER — ENOXAPARIN SODIUM 40 MG/0.4ML ~~LOC~~ SOLN: 40.0000 mg | SUBCUTANEOUS | Status: DC

## 2020-02-07 MED ORDER — ATORVASTATIN CALCIUM 80 MG PO TABS
80.0000 mg | ORAL_TABLET | Freq: Every day | ORAL | Status: DC
  Administered 2020-02-07 – 2020-02-08 (×2): 80 mg via ORAL
  Filled 2020-02-07 (×2): qty 1

## 2020-02-07 MED ORDER — METOPROLOL TARTRATE 12.5 MG HALF TABLET
12.5000 mg | ORAL_TABLET | Freq: Two times a day (BID) | ORAL | Status: DC
  Administered 2020-02-07 – 2020-02-08 (×2): 12.5 mg via ORAL
  Filled 2020-02-07 (×2): qty 1

## 2020-02-07 NOTE — Consult Note (Signed)
Chief Complaint: Patient was seen in consultation today for cerebral arteriogram Chief Complaint  Patient presents with  . Extremity Weakness   at the request of Dr Lavera Guise   Supervising Physician: Luanne Bras  Patient Status: [facility] - In-pt  History of Present Illness: Jacqueline Medina is a 50 y.o. female   Hx HTN; anxiety Presented to ED with Rt hand weakness x 2 days Kept dropping things; unable to write well Word finding difficulty  Work up included imaging: MR Brain: IMPRESSION: 1. 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 2. Subacute left frontal infarct. 3. Moderately extensive chronic small vessel ischemic disease. 4. 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  CTA Head/Neck:IMPRESSION: No sign of atherosclerotic vascular disease. Left supraclinoid ICA occlusion. Reconstituted/collateral supply visible in the anterior and middle cerebral territories, with some deficiency in the left frontal region. Distal right ICA is abnormally small but does remain patent, supplying the right middle cerebral artery territory. Occlusion or severe stenosis of the right ACA. Reconstituted/collateral distal flow. The differential diagnosis in this case is that of moyamoya disease versus is some other sort of vasculitis. 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 Scheduled now for cerebral arteriogram per Dr Erlinda Hong All symptoms resolving   Past Medical History:  Diagnosis Date  . Hypertension     History reviewed. No pertinent surgical history.  Allergies: Patient has no known allergies.  Medications: Prior to Admission medications   Medication Sig Start Date End Date Taking? Authorizing Provider  aspirin EC 81 MG tablet Take 81 mg by mouth daily.   Yes [provider]  busPIRone (BUSPAR) 5 MG tablet Take 5 mg by mouth in the morning and at bedtime.   Yes [provider]  cyclobenzaprine (FLEXERIL) 10 MG tablet Take 10 mg by mouth 3 (three) times daily as needed for muscle spasms.   Yes [provider]  hydrochlorothiazide (HYDRODIURIL) 25 MG tablet Take 25 mg by mouth daily.   Yes [provider]  lisinopril (ZESTRIL) 5 MG tablet Take 5 mg by mouth daily.   Yes [provider]  metoprolol tartrate (LOPRESSOR) 25 MG tablet Take 25 mg by mouth 2 (two) times daily.   Yes [provider]  Multiple Vitamin (MULTIVITAMIN WITH MINERALS) TABS tablet Take 1 tablet by mouth daily.   Yes [provider]  naproxen (NAPROSYN) 500 MG tablet Take 500 mg by mouth daily as needed for moderate pain.   Yes [provider]  potassium chloride (KLOR-CON) 20 MEQ packet Take 20 mEq by mouth daily.   Yes [provider]     Family History  Problem Relation Age of Onset  . Diabetes Father     Social History   Socioeconomic History  . Marital status: Single    Spouse name: Not on file  . Number of children: Not on file  . Years of education: Not on file  . Highest education level: Not on file  Occupational History  . Not on file  Tobacco Use  . Smoking status: Never Smoker  . Smokeless tobacco: Never Used  Substance and Sexual Activity  . Alcohol use: Not Currently  . Drug use: Never  . Sexual activity: Not on file  Other Topics Concern  . Not on  file  Social History Narrative  . Not on file   Social Determinants of Health   Financial Resource Strain:   . Difficulty of Paying Living Expenses:   Food Insecurity:   . Worried About Charity fundraiser in the Last Year:   . Arboriculturist in the Last Year:   Transportation Needs:   . Film/video editor (Medical):   Marland Kitchen Lack of Transportation (Non-Medical):   Physical Activity:   . Days of Exercise per Week:   . Minutes of  Exercise per Session:   Stress:   . Feeling of Stress :   Social Connections:   . Frequency of Communication with Friends and Family:   . Frequency of Social Gatherings with Friends and Family:   . Attends Religious Services:   . Active Member of Clubs or Organizations:   . Attends Archivist Meetings:   Marland Kitchen Marital Status:     Review of Systems: A 12 point ROS discussed and pertinent positives are indicated in the HPI above.  All other systems are negative.  Review of Systems  Constitutional: Negative for activity change, appetite change, fatigue and fever.  HENT: Negative for hearing loss, tinnitus and trouble swallowing.   Eyes: Negative for visual disturbance.  Respiratory: Negative for cough and shortness of breath.   Cardiovascular: Negative for chest pain.  Gastrointestinal: Negative for abdominal pain.  Musculoskeletal: Negative for back pain.  Neurological: Positive for speech difficulty, weakness and numbness. Negative for dizziness, tremors, seizures, syncope, facial asymmetry, light-headedness and headaches.  Psychiatric/Behavioral: Negative for confusion and decreased concentration.    Vital Signs: BP 134/86 (BP Location: Right Arm)   Pulse 93   Temp 98.6 F (37 C) (Oral)   Resp 14   Ht 5\' 6"  (1.676 m)   Wt 154 lb 5.2 oz (70 kg)   LMP 01/11/2020 (Approximate)   SpO2 100%   BMI 24.91 kg/m   Physical Exam Vitals reviewed.  HENT:     Head: Atraumatic.     Mouth/Throat:     Mouth: Mucous membranes are moist.  Eyes:     Extraocular Movements: Extraocular movements intact.  Cardiovascular:     Rate and Rhythm: Normal rate and regular rhythm.     Heart sounds: Normal heart sounds.  Pulmonary:     Effort: Pulmonary effort is normal.     Breath sounds: Normal breath sounds.  Abdominal:     Palpations: Abdomen is soft.     Tenderness: There is no abdominal tenderness.  Musculoskeletal:        General: Normal range of motion.     Right lower leg: No  edema.     Left lower leg: No edema.  Skin:    General: Skin is warm and dry.  Neurological:     Mental Status: She is alert and oriented to person, place, and time.  Psychiatric:        Mood and Affect: Mood normal.        Behavior: Behavior normal.        Thought Content: Thought content normal.        Judgment: Judgment normal.     Imaging: CT ANGIO HEAD W OR WO CONTRAST  Addendum Date: 02/06/2020   ADDENDUM REPORT: 02/06/2020 10:10 ADDENDUM: The impression should also include note of abnormal and somewhat unusual appearing bilateral level 2 cervical lymph nodes. The nodes are slightly enlarged, enhance more than normal and show some internal heterogeneity. The nature of this  is unclear. There is no older study for comparison. The single largest level 2 node on the left measures 20 x 12 x 10 mm, only mildly enlarged. These may simply be reactive to some systemic or head and neck inflammatory process. Electronically Signed   By: Nelson Chimes M.D.   On: 02/06/2020 10:10   Result Date: 02/06/2020 CLINICAL DATA:  Aphasia. Right arm and leg weakness. MRI showed acute right ACA territory strokes and left frontal stroke. EXAM: CT ANGIOGRAPHY HEAD AND NECK TECHNIQUE: Multidetector CT imaging of the head and neck was performed using the standard protocol during bolus administration of intravenous contrast. Multiplanar CT image reconstructions and MIPs were obtained to evaluate the vascular anatomy. Carotid stenosis measurements (when applicable) are obtained utilizing NASCET criteria, using the distal internal carotid diameter as the denominator. CONTRAST:  28mL OMNIPAQUE IOHEXOL 350 MG/ML SOLN COMPARISON:  MRI yesterday. FINDINGS: CTA NECK FINDINGS Aortic arch: Aorta appears normal without visible atherosclerotic change. Branching pattern is normal without origin stenosis. Right carotid system: Common carotid artery widely patent to the bifurcation. Carotid bifurcation is normal without soft or  calcified plaque. Cervical ICA is normal. Left carotid system: Common carotid artery widely patent to the bifurcation. Carotid bifurcation is normal. Narrowing of the left ICA beginning at the distal bulb and extending through the skull base. This is secondary to out flow restriction. See below. Vertebral arteries: Both vertebral artery origins widely patent. Both vertebral arteries normal through the cervical region. Skeleton: Normal Other neck: Abnormal appearing bilateral level 2 nodes showing heterogeneous nature and enhancement. Petra Kuba of this is uncertain. Largest node is a left level 2 node measuring 20 x 12 x 10 mm. Upper chest: Normal Review of the MIP images confirms the above findings CTA HEAD FINDINGS Anterior circulation: Right internal carotid artery is patent through the skull base. The vessel is narrowed in the siphon region, right anterior cerebral artery is either occluded or extremely small. Left ICA is a very small vessel, only 1 mm, patent through the skull base and siphon region but occluded at the supraclinoid ICA level. There is collateral or reconstituted flow in the left MCA territory. Less vigorous reconstituted or collateral flow in the left ACA territory region. Posterior circulation: Both vertebral arteries are patent to the basilar. No basilar stenosis. Posterior circulation branch vessels appear patent and normal. Venous sinuses: Patent and normal. Anatomic variants: None significant. Review of the MIP images confirms the above findings IMPRESSION: No sign of atherosclerotic vascular disease. Left supraclinoid ICA occlusion. Reconstituted/collateral supply visible in the anterior and middle cerebral territories, with some deficiency in the left frontal region. Distal right ICA is abnormally small but does remain patent, supplying the right middle cerebral artery territory. Occlusion or severe stenosis of the right ACA. Reconstituted/collateral distal flow. The differential diagnosis  in this case is that of moyamoya disease versus is some other sort of vasculitis. 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 Electronically Signed: By: Nelson Chimes M.D. On: 02/06/2020 08:49   CT HEAD WO CONTRAST  Addendum Date: 02/05/2020   ADDENDUM REPORT: 02/05/2020 14:53 ADDENDUM: The findings and impression should read left frontal lobe not right. Electronically Signed   By: Inez Catalina M.D.   On: 02/05/2020 14:53   Result Date: 02/05/2020 CLINICAL DATA:  Right hand weakness for 2 days, initial encounter EXAM: CT HEAD WITHOUT CONTRAST TECHNIQUE: Contiguous axial images were obtained from the base of the skull through the vertex without intravenous contrast. COMPARISON:  None. FINDINGS:  Brain: There is an area of geographic decreased attenuation identified in the right frontal lobe near the vertex consistent with subacute ischemia. No other focal infarct is seen. No hemorrhage or mass lesion is noted. Vascular: No hyperdense vessel or unexpected calcification. Skull: Normal. Negative for fracture or focal lesion. Sinuses/Orbits: No acute finding. Other: None. IMPRESSION: Area of decreased attenuation in the right frontal lobe consistent with subacute ischemia. No other focal abnormality is noted. Electronically Signed: By: Inez Catalina M.D. On: 02/05/2020 13:10   CT ANGIO NECK W OR WO CONTRAST  Addendum Date: 02/06/2020   ADDENDUM REPORT: 02/06/2020 10:10 ADDENDUM: The impression should also include note of abnormal and somewhat unusual appearing bilateral level 2 cervical lymph nodes. The nodes are slightly enlarged, enhance more than normal and show some internal heterogeneity. The nature of this is unclear. There is no older study for comparison. The single largest level 2 node on the left measures 20 x 12 x 10 mm, only mildly enlarged. These may simply be reactive to some systemic or head and neck inflammatory process. Electronically Signed   By: Nelson Chimes M.D.   On: 02/06/2020 10:10    Result Date: 02/06/2020 CLINICAL DATA:  Aphasia. Right arm and leg weakness. MRI showed acute right ACA territory strokes and left frontal stroke. EXAM: CT ANGIOGRAPHY HEAD AND NECK TECHNIQUE: Multidetector CT imaging of the head and neck was performed using the standard protocol during bolus administration of intravenous contrast. Multiplanar CT image reconstructions and MIPs were obtained to evaluate the vascular anatomy. Carotid stenosis measurements (when applicable) are obtained utilizing NASCET criteria, using the distal internal carotid diameter as the denominator. CONTRAST:  68mL OMNIPAQUE IOHEXOL 350 MG/ML SOLN COMPARISON:  MRI yesterday. FINDINGS: CTA NECK FINDINGS Aortic arch: Aorta appears normal without visible atherosclerotic change. Branching pattern is normal without origin stenosis. Right carotid system: Common carotid artery widely patent to the bifurcation. Carotid bifurcation is normal without soft or calcified plaque. Cervical ICA is normal. Left carotid system: Common carotid artery widely patent to the bifurcation. Carotid bifurcation is normal. Narrowing of the left ICA beginning at the distal bulb and extending through the skull base. This is secondary to out flow restriction. See below. Vertebral arteries: Both vertebral artery origins widely patent. Both vertebral arteries normal through the cervical region. Skeleton: Normal Other neck: Abnormal appearing bilateral level 2 nodes showing heterogeneous nature and enhancement. Petra Kuba of this is uncertain. Largest node is a left level 2 node measuring 20 x 12 x 10 mm. Upper chest: Normal Review of the MIP images confirms the above findings CTA HEAD FINDINGS Anterior circulation: Right internal carotid artery is patent through the skull base. The vessel is narrowed in the siphon region, right anterior cerebral artery is either occluded or extremely small. Left ICA is a very small vessel, only 1 mm, patent through the skull base and siphon  region but occluded at the supraclinoid ICA level. There is collateral or reconstituted flow in the left MCA territory. Less vigorous reconstituted or collateral flow in the left ACA territory region. Posterior circulation: Both vertebral arteries are patent to the basilar. No basilar stenosis. Posterior circulation branch vessels appear patent and normal. Venous sinuses: Patent and normal. Anatomic variants: None significant. Review of the MIP images confirms the above findings IMPRESSION: No sign of atherosclerotic vascular disease. Left supraclinoid ICA occlusion. Reconstituted/collateral supply visible in the anterior and middle cerebral territories, with some deficiency in the left frontal region. Distal right ICA is abnormally small but does  remain patent, supplying the right middle cerebral artery territory. Occlusion or severe stenosis of the right ACA. Reconstituted/collateral distal flow. The differential diagnosis in this case is that of moyamoya disease versus is some other sort of vasculitis. 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 Electronically Signed: By: Nelson Chimes M.D. On: 02/06/2020 08:49   MR ANGIO HEAD WO CONTRAST  Result Date: 02/05/2020 CLINICAL DATA:  Mild aphasia. Transient right arm and leg weakness and left face numbness. EXAM: MRI HEAD WITHOUT CONTRAST MRA HEAD WITHOUT CONTRAST TECHNIQUE: Multiplanar, multiecho pulse sequences of the brain and surrounding structures were obtained without intravenous contrast. Angiographic images of the head were obtained using MRA technique without contrast. COMPARISON:  Head CT 02/05/2020 FINDINGS: MRI HEAD FINDINGS Brain: There are multiple punctate acute infarcts involving medial right frontal lobe cortex and subcortical white matter (ACA territory), right periatrial white matter, and left precentral gyrus. Additionally, there is a more confluent region of milder diffusion weighted signal abnormality in the left frontal lobe  involving cortex and white matter measuring approximately 3 cm in size with T2 hyperintensity, facilitated diffusion, and petechial hemorrhage compatible with a subacute infarct and corresponding to the abnormality on CT. T2 hyperintensities elsewhere in the cerebral white matter bilaterally are nonspecific but compatible with moderately age advanced chronic small vessel ischemic disease. There is also a late subacute to chronic left basal ganglia infarct with chronic blood products. The ventricles and sulci are normal. No mass, midline shift, or extra-axial fluid collection is identified. Vascular: Abnormal appearance of the left ICA, more fully evaluated below. Skull and upper cervical spine: Unremarkable bone marrow signal. Sinuses/Orbits: Unremarkable orbits. Paranasal sinuses and mastoid air cells are clear. Other: None. MRA HEAD FINDINGS The visualized distal to the basilar and codominant vertebral arteries are widely patent. Patent PICAs, AICAs, and SCAs are seen bilaterally. The basilar artery is widely patent. There are medium sized posterior communicating arteries bilaterally with diminished or absent flow related enhancement proximally on the left. The PCAs are patent without evidence of significant proximal stenosis. The intracranial right internal carotid artery is patent with severe tandem stenosis involving the distal supraclinoid segment and terminus. The intracranial left ICA is patent proximally but small and is occluded from the proximal supraclinoid segment through the terminus. The left MCA and both ACAs are occluded. The right MCA is patent with a severe stenosis at its origin. No aneurysm is identified. IMPRESSION: 1. 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 2. Subacute left frontal infarct. 3. Moderately extensive chronic small vessel ischemic disease. 4. Occlusion of the distal left ICA, left MCA, and both ACAs with severe tandem stenoses of the  distal right ICA (Moyamoya pattern). Electronically Signed   By: Logan Bores M.D.   On: 02/05/2020 19:08   MR BRAIN WO CONTRAST  Result Date: 02/05/2020 CLINICAL DATA:  Mild aphasia. Transient right arm and leg weakness and left face numbness. EXAM: MRI HEAD WITHOUT CONTRAST MRA HEAD WITHOUT CONTRAST TECHNIQUE: Multiplanar, multiecho pulse sequences of the brain and surrounding structures were obtained without intravenous contrast. Angiographic images of the head were obtained using MRA technique without contrast. COMPARISON:  Head CT 02/05/2020 FINDINGS: MRI HEAD FINDINGS Brain: There are multiple punctate acute infarcts involving medial right frontal lobe cortex and subcortical white matter (ACA territory), right periatrial white matter, and left precentral gyrus. Additionally, there is a more confluent region of milder diffusion weighted signal abnormality in the left frontal lobe involving cortex and  white matter measuring approximately 3 cm in size with T2 hyperintensity, facilitated diffusion, and petechial hemorrhage compatible with a subacute infarct and corresponding to the abnormality on CT. T2 hyperintensities elsewhere in the cerebral white matter bilaterally are nonspecific but compatible with moderately age advanced chronic small vessel ischemic disease. There is also a late subacute to chronic left basal ganglia infarct with chronic blood products. The ventricles and sulci are normal. No mass, midline shift, or extra-axial fluid collection is identified. Vascular: Abnormal appearance of the left ICA, more fully evaluated below. Skull and upper cervical spine: Unremarkable bone marrow signal. Sinuses/Orbits: Unremarkable orbits. Paranasal sinuses and mastoid air cells are clear. Other: None. MRA HEAD FINDINGS The visualized distal to the basilar and codominant vertebral arteries are widely patent. Patent PICAs, AICAs, and SCAs are seen bilaterally. The basilar artery is widely patent. There are  medium sized posterior communicating arteries bilaterally with diminished or absent flow related enhancement proximally on the left. The PCAs are patent without evidence of significant proximal stenosis. The intracranial right internal carotid artery is patent with severe tandem stenosis involving the distal supraclinoid segment and terminus. The intracranial left ICA is patent proximally but small and is occluded from the proximal supraclinoid segment through the terminus. The left MCA and both ACAs are occluded. The right MCA is patent with a severe stenosis at its origin. No aneurysm is identified. IMPRESSION: 1. 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 2. Subacute left frontal infarct. 3. Moderately extensive chronic small vessel ischemic disease. 4. 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 Electronically Signed   By: Logan Bores M.D.   On: 02/05/2020 19:08   ECHOCARDIOGRAM COMPLETE  Result Date: 02/06/2020    ECHOCARDIOGRAM REPORT   Patient Name:   Jacqueline Medina Date of Exam: 02/06/2020 Medical Rec #:  [id number]   Height:       66.0 in Accession #:    [id number]  Weight:       154.3 lb Date of Birth:  03-18-1971   BSA:          1.791 m Patient Age:    68 years    BP:           123/77 mmHg Patient Gender: F           HR:           104 bpm. Exam Location:  Inpatient Procedure: 2D Echo, Cardiac Doppler and Color Doppler Indications:    Stroke  History:        Patient has no prior history of Echocardiogram examinations.                 Risk Factors:Hypertension.  Sonographer:    Clayton Lefort RDCS (AE) Referring Phys: [id number] RONDELL A SMITH IMPRESSIONS  1. Left ventricular ejection fraction, by estimation, is 60 to 65%. The left ventricle has normal function. The left ventricle has no regional wall motion abnormalities. There is mild asymmetric left ventricular hypertrophy of the basal  and septal segments. Left ventricular diastolic parameters are consistent with Grade I diastolic dysfunction (impaired relaxation).  2. Right ventricular systolic function is normal. The right ventricular size is normal. There is normal pulmonary artery systolic pressure.  3. The mitral valve is normal in structure. Trivial mitral valve regurgitation. No evidence of mitral stenosis.  4. The aortic valve is tricuspid. Aortic valve regurgitation is  not visualized. Mild aortic valve sclerosis is present, with no evidence of aortic valve stenosis.  5. The inferior vena cava is normal in size with greater than 50% respiratory variability, suggesting right atrial pressure of 3 mmHg. FINDINGS  Left Ventricle: Left ventricular ejection fraction, by estimation, is 60 to 65%. The left ventricle has normal function. The left ventricle has no regional wall motion abnormalities. The left ventricular internal cavity size was normal in size. There is  mild asymmetric left ventricular hypertrophy of the basal and septal segments. Left ventricular diastolic parameters are consistent with Grade I diastolic dysfunction (impaired relaxation). Right Ventricle: The right ventricular size is normal. No increase in right ventricular wall thickness. Right ventricular systolic function is normal. There is normal pulmonary artery systolic pressure. The tricuspid regurgitant velocity is 2.22 m/s, and  with an assumed right atrial pressure of 8 mmHg, the estimated right ventricular systolic pressure is [id number] mmHg. Left Atrium: Left atrial size was normal in size. Right Atrium: Right atrial size was normal in size. Pericardium: There is no evidence of pericardial effusion. Mitral Valve: The mitral valve is normal in structure. There is mild thickening of the mitral valve leaflet(s). There is mild calcification of the mitral valve leaflet(s). Normal mobility of the mitral valve leaflets. Trivial mitral valve regurgitation. No evidence of mitral  valve stenosis. MV peak gradient, 9.7 mmHg. The mean mitral valve gradient is 4.0 mmHg. Tricuspid Valve: The tricuspid valve is normal in structure. Tricuspid valve regurgitation is trivial. No evidence of tricuspid stenosis. Aortic Valve: The aortic valve is tricuspid. Aortic valve regurgitation is not visualized. Mild aortic valve sclerosis is present, with no evidence of aortic valve stenosis. Aortic valve mean gradient measures 7.0 mmHg. Aortic valve peak gradient measures 12.8 mmHg. Aortic valve area, by VTI measures 1.76 cm. Pulmonic Valve: The pulmonic valve was normal in structure. Pulmonic valve regurgitation is trivial. No evidence of pulmonic stenosis. Aorta: The aortic root is normal in size and structure. Venous: The inferior vena cava is normal in size with greater than 50% respiratory variability, suggesting right atrial pressure of 3 mmHg. IAS/Shunts: No atrial level shunt detected by color flow Doppler.  LEFT VENTRICLE PLAX 2D LVIDd:         3.50 cm  Diastology LVIDs:         2.20 cm  LV e' lateral:   9.68 cm/s LV PW:         1.20 cm  LV E/e' lateral: 9.7 LV IVS:        1.40 cm  LV e' medial:    6.96 cm/s LVOT diam:     1.60 cm  LV E/e' medial:  13.5 LV SV:         51 LV SV Index:   29 LVOT Area:     2.01 cm  RIGHT VENTRICLE             IVC RV Basal diam:  3.40 cm     IVC diam: 1.20 cm RV S prime:     12.20 cm/s TAPSE (M-mode): 2.2 cm LEFT ATRIUM           Index       RIGHT ATRIUM           Index LA diam:      3.10 cm 1.73 cm/m  RA Area:     10.60 cm LA Vol (A4C): 39.9 ml 22.28 ml/m RA Volume:   23.10 ml  12.90 ml/m  AORTIC VALVE AV Area (  Vmax):    1.61 cm AV Area (Vmean):   1.48 cm AV Area (VTI):     1.76 cm AV Vmax:           179.00 cm/s AV Vmean:          122.000 cm/s AV VTI:            0.290 m AV Peak Grad:      12.8 mmHg AV Mean Grad:      7.0 mmHg LVOT Vmax:         143.00 cm/s LVOT Vmean:        89.500 cm/s LVOT VTI:          0.254 m LVOT/AV VTI ratio: 0.88  AORTA Ao Root diam: 2.70  cm Ao Asc diam:  2.90 cm MITRAL VALVE                TRICUSPID VALVE MV Area (PHT): 4.63 cm     TR Peak grad:   19.7 mmHg MV Peak grad:  9.7 mmHg     TR Vmax:        222.00 cm/s MV Mean grad:  4.0 mmHg MV Vmax:       1.56 m/s     SHUNTS MV Vmean:      86.8 cm/s    Systemic VTI:  0.25 m MV Decel Time: 164 msec     Systemic Diam: 1.60 cm MV E velocity: 94.30 cm/s MV A velocity: 137.00 cm/s MV E/A ratio:  0.69 Jenkins Rouge MD Electronically signed by Jenkins Rouge MD Signature Date/Time: 02/06/2020/10:18:54 AM    Final    DG Hip Unilat W or Wo Pelvis 2-3 Views Right  Result Date: 02/05/2020 CLINICAL DATA:  Fall 1 week ago with persistent right hip pain, initial encounter EXAM: DG HIP (WITH OR WITHOUT PELVIS) 2-3V RIGHT COMPARISON:  None. FINDINGS: Pelvic ring is intact. Mild degenerative changes of the hip joints are noted bilaterally. No acute fracture or dislocation is seen. No soft tissue abnormality is noted. IMPRESSION: Degenerative change without acute abnormality. Electronically Signed   By: Inez Catalina M.D.   On: 02/05/2020 15:14    Labs:  CBC: Recent Labs    02/05/20 1228 02/05/20 1259  WBC 10.0  --   HGB 14.5 14.6  HCT 42.9 43.0  PLT 301  --     COAGS: Recent Labs    02/05/20 1228  INR 1.0  APTT 26    BMP: Recent Labs    02/05/20 1228 02/05/20 1259  NA 133* 136  K 4.4 4.4  CL 97* 99  CO2 27  --   GLUCOSE 89 83  BUN 18 21*  CALCIUM 8.5*  --   CREATININE 1.12* 1.10*  GFRNONAA 57*  --   GFRAA >60  --     LIVER FUNCTION TESTS: Recent Labs    02/05/20 1228  BILITOT 1.0  AST 17  ALT 18  ALKPHOS 84  PROT 6.6  ALBUMIN 3.2*    TUMOR MARKERS: No results for input(s): AFPTM, CEA, CA199, CHROMGRNA in the last 8760 hours.  Assessment and Plan:  New CVA Rt hand numbness/weakness Aphasia Abnormal MRA and CTA Scheduled for cerebral arteriogram in IR 5/18 Risks and benefits of cerebral angiogram with intervention were discussed with the patient including, but  not limited to bleeding, infection, vascular injury, contrast induced renal failure, stroke or even death.  This interventional procedure involves the use of X-rays and because of the nature of the planned  procedure, it is possible that we will have prolonged use of X-ray fluoroscopy.  Potential radiation risks to you include (but are not limited to) the following: - A slightly elevated risk for cancer  several years later in life. This risk is typically less than 0.5% percent. This risk is low in comparison to the normal incidence of human cancer, which is 33% for women and 50% for men according to the [place]. - Radiation induced injury can include skin redness, resembling a rash, tissue breakdown / ulcers and hair loss (which can be temporary or permanent).   The likelihood of either of these occurring depends on the difficulty of the procedure and whether you are sensitive to radiation due to previous procedures, disease, or genetic conditions.   IF your procedure requires a prolonged use of radiation, you will be notified and given written instructions for further action.  It is your responsibility to monitor the irradiated area for the 2 weeks following the procedure and to notify your physician if you are concerned that you have suffered a radiation induced injury.    All of the patient's questions were answered, patient is agreeable to proceed.  Consent signed and in chart.  Thank you for this interesting consult.  I greatly enjoyed meeting Jacqueline Medina and look forward to participating in their care.  A copy of this report was sent to the requesting provider on this date.  Electronically Signed: Lavonia Drafts, PA-C 02/07/2020, 2:21 PM   I spent a total of 40 Minutes    in face to face in clinical consultation, greater than 50% of which was counseling/coordinating care for cerebral arteriogram

## 2020-02-07 NOTE — Evaluation (Signed)
Occupational Therapy Evaluation Patient Details Name: Jacqueline Medina MRN: [id number] DOB: 1971/01/03 Today's Date: 02/07/2020    History of Present Illness 49 y.o. right-handed female with medical history significant of hypertension who presented 02/05/20 with weakness of her right hand x 2 days. Patient knew what she wanted to say, but was having difficulty getting the words out. CT scan of the brain significant for left frontal lobe area of decreased attenuation concerning for subacute ischemia. MRI brain multiple small acute infarcts rt ACA territory and lt precentral gyrus; subacute lt frontal infarct; occlusion distal lt ICA, lt MCA and bil ACAs, severe stenosis rt ICA (Moyamoya pattern); moderately extensive chronic small vessel disease. Permissive HTN up to 220/110; pt reports fall on steps in the rain, wearing flip flops 2 weeks ago with resulting rt hip pain. xray hip negative   Clinical Impression   PTA patient independent and working. Admitted for above and limited by problem list below, including impaired cognition (memory, sequencing, attention, problem solving).  Patient completing transfers and basic ADLs with supervision to independence.  Completed short blessed test and scoring 12/28, with deficits noted as listed above.  She has good support from daughters at home, and educated on assist for cooking, cleaning, med mgmt and driving at this time.  Will continue to follow acutely and recommend follow up with OUTPATIENT OT at discharge. Plan for pill box test next session.     Follow Up Recommendations  Outpatient OT;Supervision/Assistance - 24 hour    Equipment Recommendations  None recommended by OT    Recommendations for Other Services       Precautions / Restrictions Precautions Precautions: None Restrictions Weight Bearing Restrictions: No      Mobility Bed Mobility Overal bed mobility: Independent                Transfers Overall transfer level: Needs  assistance Equipment used: None Transfers: Sit to/from Stand Sit to Stand: Supervision;Independent         General transfer comment: independent for back transfers, supervision for simulated tub transfers    Balance Overall balance assessment: No apparent balance deficits (not formally assessed)                                         ADL either performed or assessed with clinical judgement   ADL Overall ADL's : Needs assistance/impaired     Grooming: Supervision/safety;Standing   Upper Body Bathing: Set up;Sitting   Lower Body Bathing: Set up;Sit to/from stand   Upper Body Dressing : Set up;Sitting   Lower Body Dressing: Supervision/safety;Sit to/from stand   Toilet Transfer: Ambulation;Independent Toilet Transfer Details (indicate cue type and reason): simulated in room     Tub/ Shower Transfer: Tub transfer;Supervision/safety;Ambulation Tub/Shower Transfer Details (indicate cue type and reason): simulated in room Functional mobility during ADLs: Supervision/safety General ADL Comments: pt limited by cognition     Vision   Vision Assessment?: No apparent visual deficits     Perception     Praxis      Pertinent Vitals/Pain Pain Assessment: No/denies pain     Hand Dominance Right   Extremity/Trunk Assessment Upper Extremity Assessment Upper Extremity Assessment: Overall WFL for tasks assessed(R UE with no deficits noted)   Lower Extremity Assessment Lower Extremity Assessment: Defer to PT evaluation   Cervical / Trunk Assessment Cervical / Trunk Assessment: Normal   Communication Communication Communication: No difficulties  Cognition Arousal/Alertness: Awake/alert Behavior During Therapy: WFL for tasks assessed/performed Overall Cognitive Status: Impaired/Different from baseline Area of Impairment: Memory;Attention;Problem solving                   Current Attention Level: Selective Memory: Decreased short-term  memory       Problem Solving: Slow processing;Difficulty sequencing;Requires verbal cues General Comments: patient limited by decreased problem solving, attention, and memory; short blessed test completed: 12/28 (impairement consistent with dementia) in memory and sequencing    General Comments  discussed cognitive deficits with pt and family --recommended no driving, assist with IADls and med mgmt at this time     Exercises     Shoulder Instructions      Home Living Family/patient expects to be discharged to:: Private residence Living Arrangements: Children Available Help at Discharge: Family;Available PRN/intermittently Type of Home: House Home Access: Level entry     Home Layout: One level     Bathroom Shower/Tub: Tub/shower unit;Curtain   Biochemist, clinical: Standard     Home Equipment: None      Lives With: Family    Prior Functioning/Environment Level of Independence: Independent        Comments: working full-time at [employer] (12 hr shifts; on concrete wearing steel toe boots)        OT Problem List:        OT Treatment/Interventions:      OT Goals(Current goals can be found in the care plan section) Acute Rehab OT Goals Patient Stated Goal: get home, back to normal OT Goal Formulation: With patient Time For Goal Achievement: 02/21/20 Potential to Achieve Goals: Good  OT Frequency: Min 2X/week   Barriers to D/C:            Co-evaluation              AM-PAC OT "6 Clicks" Daily Activity     Outcome Measure Help from another person eating meals?: None Help from another person taking care of personal grooming?: A Little Help from another person toileting, which includes using toliet, bedpan, or urinal?: A Little Help from another person bathing (including washing, rinsing, drying)?: A Little Help from another person to put on and taking off regular upper body clothing?: None Help from another person to put on and taking off regular lower body  clothing?: A Little 6 Click Score: 20   End of Session Nurse Communication: Mobility status  Activity Tolerance: Patient tolerated treatment well Patient left: in bed;with call bell/phone within reach;with family/visitor present  OT Visit Diagnosis: Other symptoms and signs involving cognitive function                Time: 1000-1017 OT Time Calculation (min): 17 min Charges:  OT General Charges $OT Visit: 1 Visit OT Evaluation $OT Eval Moderate Complexity: 1 Mod  Jolaine Artist, OT Acute Rehabilitation Services Pager [phone] Office [phone]   Delight Stare 02/07/2020, 10:26 AM

## 2020-02-07 NOTE — TOC Initial Note (Addendum)
Transition of Care [facility]) - Initial/Assessment Note    Patient Details  Name: Jacqueline Medina MRN: [id number] Date of Birth: January 06, 1971  [facility]) CM/SW Contact:    Pollie Friar, RN Phone Number: 02/07/2020, 11:18 AM  Clinical Narrative:                 Pt is from home with her 2 daughters. She has no DME at home and was driving herself and doing her own meds.  PT states may need some outpatient therapy but pt feels she may not need this at d/c. Pt has PCP but wants one in [place]. CM offered to assist. Pts daughter to update CM if assist needed.  TOC following.   1500: Daughter called and they would like her set up with Dr [name] at [facility] in [place]. CM was able to get an appt and placed it on the AVS.     Expected Discharge Plan: Home/Self Care Barriers to Discharge: Continued Medical Work up   Patient Goals and CMS Choice        Expected Discharge Plan and Services Expected Discharge Plan: Home/Self Care   Discharge Planning Services: CM Consult   Living arrangements for the past 2 months: Single Family Home                                      Prior Living Arrangements/Services Living arrangements for the past 2 months: Single Family Home Lives with:: Adult Children Patient language and need for interpreter reviewed:: Yes Do you feel safe going back to the place where you live?: Yes      Need for Family Participation in Patient Care: Yes (Comment) Care giver support system in place?: Yes (comment)(2 daughters)   Criminal Activity/Legal Involvement Pertinent to Current Situation/Hospitalization: No - Comment as needed  Activities of Daily Living      Permission Sought/Granted                  Emotional Assessment Appearance:: Appears stated age Attitude/Demeanor/Rapport: Engaged Affect (typically observed): Accepting Orientation: : Oriented to Self, Oriented to Place, Oriented to  Time, Oriented to Situation   Psych  Involvement: No (comment)  Admission diagnosis:  Aphasia [R47.01] CVA (cerebral vascular accident) ([place]) [I63.9] Right hip pain [M25.551] Weakness of right arm [R29.898] Numbness and tingling of left side of face [R20.0, R20.2] Transient weakness of right lower extremity [R29.898] Cerebrovascular accident (CVA), unspecified mechanism ([place]) [I63.9] Patient Active Problem List   Diagnosis Date Noted  . CVA (cerebral vascular accident) ([place]) 02/05/2020  . Essential hypertension 02/05/2020  . Hyponatremia 02/05/2020  . Anxiety 02/05/2020   PCP:  Patient, No Pcp Per Pharmacy:   [facility] [place], [place] [address] Phone: [phone] Fax: [phone]     Social Determinants of Health (SDOH) Interventions    Readmission Risk Interventions No flowsheet data found.

## 2020-02-07 NOTE — Progress Notes (Signed)
PROGRESS NOTE  Jacqueline Medina [id number] DOB: 1971/09/07 DOA: 02/05/2020 PCP: Patient, No Pcp Per  HPI/Recap of past 24 hours:  Jacqueline Medina is a 49 y.o. right-handed female with medical history significant of hypertension, chronic anxiety, who presented with weakness of her right hand starting 2 days prior to presentation.  She noticed that she kept dropping things in her right hand and was unable to write.  Patient knew what she wanted to say, but was having difficulty getting the words out.  Associated symptoms include intermittent palpitations and fall about 1 week prior with corresponding right hip pain.  She reports taking a daily aspirin.  ED Course: CT scan of the brain revealed decreased attenuation in the left frontal lobe consistent with subacute ischemia.  Seen by neurology/stroke team.  TRH asked to admit for stroke workup.  MRI brain without contrast and MRI head without contrast showed multiple small acute infarcts in the right ACA territory and left precentral gyrus.  Subacute left frontal infarct.  Moderately extensive chronic small vessel ischemic disease.  Occlusion of the distal left ICA, left MCA, and both ACAs with severe tandem stenosis of the distal right ICA.  Due to concern for possible moyamoya disease with pattern of stenosis seen on MRA brain, IR was consulted by neurology for possible cerebral angiogram by Dr. Estanislado Pandy.   She is on aspirin 325 mg daily, Plavix 75 mg daily and high intensity statin Lipitor 80 mg daily.  Ongoing permissive hypertension, home antihypertensives on hold.  She was on lisinopril 5 mg daily, p.o. Lopressor 25 mg twice daily and HCTZ 25 mg daily.  Her blood pressure has been normotensive in the last 36 hours.  02/07/20: Seen and examined.  Reports her right hand is back to baseline.  She has been able to grab things and write.   Assessment/Plan: Principal Problem:   CVA (cerebral vascular accident) ([place]) Active Problems:   Essential  hypertension   Hyponatremia   Anxiety    Multiple small acute infarcts in the right ACA territory on the left precentral gyrus/subacute left frontal infarct Suspected embolic source, symptomatology improving Findings seen on MRI brain without contrast.  Also showing moderately extensive chronic small vessel ischemic disease.  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 She was on 81 mg daily aspirin at home. Continue aspirin 325 mg daily and Plavix 75 mg daily x3 months then aspirin alone as recommended by neurology given severe intracranial artherosclerosis.   On high intensity statin, Lipitor 80 mg daily. LDL 107, goal less than 70 Hemoglobin A1c 5.5, goal less than 7.0, goal 2D echo showed LVEF 60 to 123456, grade 1 diastolic dysfunction, no cardiac source of emboli identified. OT recommended outpatient OT, PT recommended Ortho PT for right hip pain. Blood pressure has been normotensive in the last 36 hours, continue to hold off home antihypertensives  Moyamoya pattern, seen on MRA brain  MRA and CTA suggested circle of willis multivessel stenosis and occlusion Interventional radiology consulted for possible cerebral angiogram to rule out moyamoya disease.   Will follow results Continue secondary prevention management as recommended by neurology.  Essential hypertension Ongoing permissive Hypertension as stated above Continue to hold off home antihypertensives Continue to monitor vital signs  Chronic anxiety Stable Continue BuSpar  Resolved hypovolemic hyponatremia Continue gentle IV fluid hydration normal saline. Encourage oral intake as tolerated.   DVT prophylaxis: Lovenox subcu daily Code Status: Full Family Communication:  No family requested be updated at this time  Consults called: Neurology, interventional radiology   Status is: Inpatient    Dispo: The patient is from: Home               Anticipated d/c is to: Home               Anticipated d/c date is: 02/08/20              Patient currently ongoing permissive hypertension, plan for cerebral angiogram by IR, pending.          Objective: Vitals:   02/06/20 2002 02/06/20 2359 02/07/20 0408 02/07/20 0754  BP: (!) 138/93 131/76 138/77 134/86  Pulse: 100 91 90 93  Resp: 15 16 16 14   Temp: 97.6 F (36.4 C) 99.1 F (37.3 C) 98.5 F (36.9 C) 98.6 F (37 C)  TempSrc: Oral Oral Tympanic Oral  SpO2: 100% 100% 100% 100%  Weight:      Height:        Intake/Output Summary (Last 24 hours) at 02/07/2020 1056 Last data filed at 02/07/2020 0300 Gross per 24 hour  Intake 1110.75 ml  Output --  Net 1110.75 ml   Filed Weights   02/05/20 2036  Weight: 70 kg    Exam:  . General: 49 y.o. year-old female well-developed well-nourished in no acute distress.  Alert and oriented x3.   . Cardiovascular: Regular rate and rhythm no rubs or gallops. Marland Kitchen Respiratory: Clear to auscultation no wheezes or rales.   . Abdomen: Soft nontender normal bowel sounds present. . Musculoskeletal: No lower extremity edema bilaterally. Marland Kitchen Psychiatry: Mood is appropriate for condition and setting.  Data Reviewed: CBC: Recent Labs  Lab 02/05/20 1228 02/05/20 1259  WBC 10.0  --   NEUTROABS 7.0  --   HGB 14.5 14.6  HCT 42.9 43.0  MCV 91.3  --   PLT 301  --    Basic Metabolic Panel: Recent Labs  Lab 02/05/20 1228 02/05/20 1259  NA 133* 136  K 4.4 4.4  CL 97* 99  CO2 27  --   GLUCOSE 89 83  BUN 18 21*  CREATININE 1.12* 1.10*  CALCIUM 8.5*  --    GFR: Estimated Creatinine Clearance: 57.3 mL/min (A) (by C-G formula based on SCr of 1.1 mg/dL (H)). Liver Function Tests: Recent Labs  Lab 02/05/20 1228  AST 17  ALT 18  ALKPHOS 84  BILITOT 1.0  PROT 6.6  ALBUMIN 3.2*   No results for input(s): LIPASE, AMYLASE in the last 168 hours. No results for input(s): AMMONIA in the last 168 hours. Coagulation Profile: Recent Labs  Lab  02/05/20 1228  INR 1.0   Cardiac Enzymes: No results for input(s): CKTOTAL, CKMB, CKMBINDEX, TROPONINI in the last 168 hours. BNP (last 3 results) No results for input(s): PROBNP in the last 8760 hours. HbA1C: Recent Labs    02/06/20 0434  HGBA1C 5.5   CBG: No results for input(s): GLUCAP in the last 168 hours. Lipid Profile: Recent Labs    02/06/20 0434  CHOL 203*  HDL 42  LDLCALC 107*  TRIG 270*  CHOLHDL 4.8   Thyroid Function Tests: No results for input(s): TSH, T4TOTAL, FREET4, T3FREE, THYROIDAB in the last 72 hours. Anemia Panel: No results for input(s): VITAMINB12, FOLATE, FERRITIN, TIBC, IRON, RETICCTPCT in the last 72 hours. Urine analysis: No results found for: COLORURINE, APPEARANCEUR, LABSPEC, PHURINE, GLUCOSEU, HGBUR, BILIRUBINUR, KETONESUR, PROTEINUR, UROBILINOGEN, NITRITE, LEUKOCYTESUR Sepsis Labs: @LABRCNTIP (procalcitonin:4,lacticidven:4)  ) Recent Results (  from the past 240 hour(s))  SARS CORONAVIRUS 2 (TAT 6-24 HRS) Nasopharyngeal Nasopharyngeal Swab     Status: None   Collection Time: 02/05/20  7:15 PM   Specimen: Nasopharyngeal Swab  Result Value Ref Range Status   SARS Coronavirus 2 NEGATIVE NEGATIVE Final    Comment: (NOTE) SARS-CoV-2 target nucleic acids are NOT DETECTED. The SARS-CoV-2 RNA is generally detectable in upper and lower respiratory specimens during the acute phase of infection. Negative results do not preclude SARS-CoV-2 infection, do not rule out co-infections with other pathogens, and should not be used as the sole basis for treatment or other patient management decisions. Negative results must be combined with clinical observations, patient history, and epidemiological information. The expected result is Negative. Fact Sheet for Patients: SugarRoll.be Fact Sheet for Healthcare Providers: https://www.woods-mathews.com/ This test is not yet approved or cleared by the [place] FDA  and  has been authorized for detection and/or diagnosis of SARS-CoV-2 by FDA under an Emergency Use Authorization (EUA). This EUA will remain  in effect (meaning this test can be used) for the duration of the COVID-19 declaration under Section 56 4(b)(1) of the Act, 21 U.S.C. section 360bbb-3(b)(1), unless the authorization is terminated or revoked sooner. Performed at [facility], [place] [address], [place], [place] [address]       Studies: No results found.  Scheduled Meds: . aspirin  300 mg Rectal Daily   Or  . aspirin  325 mg Oral Daily  . atorvastatin  80 mg Oral Daily  . busPIRone  5 mg Oral BID  . clopidogrel  75 mg Oral Daily  . enoxaparin (LOVENOX) injection  40 mg Subcutaneous Q24H  . sodium chloride flush  3 mL Intravenous Once    Continuous Infusions: . sodium chloride 50 mL/hr at 02/07/20 0603     LOS: 2 days     Kayleen Memos, MD [facility] Pager [phone]  If 7PM-7AM, please contact night-coverage [email or web address] Password [facility] 02/07/2020, 10:56 AM

## 2020-02-07 NOTE — Progress Notes (Signed)
STROKE TEAM PROGRESS NOTE   INTERVAL HISTORY Her two daughters are at the bedside.  Pt sitting in bed talking to her manager at work. Then we discussed about not able to fit her in for angiogram today, but agree to have it done tomorrow with Dr. Estanislado Pandy. Will keep NPO after midnight.   OBJECTIVE Vitals:   02/06/20 2002 02/06/20 2359 02/07/20 0408 02/07/20 0754  BP: (!) 138/93 131/76 138/77 134/86  Pulse: 100 91 90 93  Resp: 15 16 16 14   Temp: 97.6 F (36.4 C) 99.1 F (37.3 C) 98.5 F (36.9 C) 98.6 F (37 C)  TempSrc: Oral Oral Tympanic Oral  SpO2: 100% 100% 100% 100%  Weight:      Height:        CBC:  Recent Labs  Lab 02/05/20 1228 02/05/20 1259  WBC 10.0  --   NEUTROABS 7.0  --   HGB 14.5 14.6  HCT 42.9 43.0  MCV 91.3  --   PLT 301  --     Basic Metabolic Panel:  Recent Labs  Lab 02/05/20 1228 02/05/20 1259  NA 133* 136  K 4.4 4.4  CL 97* 99  CO2 27  --   GLUCOSE 89 83  BUN 18 21*  CREATININE 1.12* 1.10*  CALCIUM 8.5*  --     Lipid Panel:     Component Value Date/Time   CHOL 203 (H) 02/06/2020 0434   TRIG 270 (H) 02/06/2020 0434   HDL 42 02/06/2020 0434   CHOLHDL 4.8 02/06/2020 0434   VLDL 54 (H) 02/06/2020 0434   LDLCALC 107 (H) 02/06/2020 0434   HgbA1c:  Lab Results  Component Value Date   HGBA1C 5.5 02/06/2020   Urine Drug Screen: No results found for: LABOPIA, COCAINSCRNUR, LABBENZ, AMPHETMU, THCU, LABBARB  Alcohol Level No results found for: ETH  IMAGING  CT ANGIO HEAD W OR WO CONTRAST CT ANGIO NECK W OR WO CONTRAST 02/06/2020   ADDENDUM:  The impression should also include note of abnormal and somewhat unusual appearing bilateral level 2 cervical lymph nodes. The nodes are slightly enlarged, enhance more than normal and show some internal heterogeneity. The nature of this is unclear. There is no older study for comparison. The single largest level 2 node on the left measures 20 x 12 x 10 mm, only mildly enlarged. These may simply be  reactive to some systemic or head and neck inflammatory process.  IMPRESSION:  No sign of atherosclerotic vascular disease. Left supraclinoid ICA occlusion. Reconstituted/collateral supply visible in the anterior and middle cerebral territories, with some deficiency in the left frontal region. Distal right ICA is abnormally small but does remain patent, supplying the right middle cerebral artery territory. Occlusion or severe stenosis of the right ACA. Reconstituted/collateral distal flow. The differential diagnosis in this case is that of moyamoya disease versus is some other sort of vasculitis. 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   CT HEAD WO CONTRAST 02/05/2020   ADDENDUM  The findings and impression should read left frontal lobe not right.  IMPRESSION:  Area of decreased attenuation in the right frontal lobe consistent with subacute ischemia. No other focal abnormality is noted.   MR BRAIN WO CONTRAST MR ANGIO HEAD WO CONTRAST 02/05/2020 IMPRESSION:  1. 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  2. Subacute left frontal infarct.  3. Moderately extensive chronic small vessel ischemic disease.  4. 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   ECHOCARDIOGRAM COMPLETE 02/06/2020 IMPRESSIONS   1. Left ventricular ejection fraction, by estimation, is 60 to 65%. The left ventricle has normal function. The left ventricle has no regional wall motion abnormalities. There is mild asymmetric left ventricular hypertrophy of the basal and septal segments. Left ventricular diastolic parameters are consistent with Grade I diastolic dysfunction (impaired relaxation).   2. Right ventricular systolic function is normal. The right ventricular size is normal. There is normal pulmonary artery systolic pressure.   3. The mitral valve is normal in structure. Trivial  mitral valve regurgitation. No evidence of mitral stenosis.   4. The aortic valve is tricuspid. Aortic valve regurgitation is not visualized. Mild aortic valve sclerosis is present, with no evidence of aortic valve stenosis.   5. The inferior vena cava is normal in size with greater than 50% respiratory variability, suggesting right atrial pressure of 3 mmHg.   DG Hip Unilat W or Wo Pelvis 2-3 Views Right 02/05/2020 IMPRESSION:  Degenerative change without acute abnormality.   ECG - SR rate 71 BPM. (See cardiology reading for complete details)   PHYSICAL EXAM  Temp:  [97.6 F (36.4 C)-99.2 F (37.3 C)] 98.6 F (37 C) (05/17 0754) Pulse Rate:  [89-100] 93 (05/17 0754) Resp:  [14-18] 14 (05/17 0754) BP: (131-139)/(76-93) 134/86 (05/17 0754) SpO2:  [100 %] 100 % (05/17 0754)  General - Well nourished, well developed, in no apparent distress.  Ophthalmologic - fundi not visualized due to noncooperation.  Cardiovascular - Regular rhythm and rate.  Mental Status -  Level of arousal and orientation to time, place, and person were intact. Language including expression, naming, repetition, comprehension was assessed and found intact.  Cranial Nerves II - XII - II - Visual field intact OU. III, IV, VI - Extraocular movements intact. V - Facial sensation intact bilaterally. VII - Facial movement intact bilaterally. VIII - Hearing & vestibular intact bilaterally. X - Palate elevates symmetrically. XI - Chin turning & shoulder shrug intact bilaterally. XII - Tongue protrusion intact.  Motor Strength - The patient's strength was normal in all extremities and pronator drift was absent.  Bulk was normal and fasciculations were absent.   Motor Tone - Muscle tone was assessed at the neck and appendages and was normal.  Reflexes - The patient's reflexes were symmetrical in all extremities and she had no pathological reflexes.  Sensory - Light touch, temperature/pinprick were assessed and  were symmetrical.    Coordination - The patient had normal movements in the hands with no ataxia or dysmetria.  Tremor was absent.  Gait and Station - deferred.   ASSESSMENT/PLAN [name] is a 49 y.o. female with history of hypertension presenting with left arm and facial numbness / weakness. 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 Less likely vasculitis given large vessel involvement  CT head - Area of decreased attenuation in the left frontal lobe consistent with subacute ischemia.  MRI head - Multiple small acute infarcts in the right ACA territory and left precentral gyrus. Subacute left frontal infarct.   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   CTA H&N -  No sign of atherosclerotic vascular disease. Left supraclinoid ICA occlusion. Reconstituted/collateral supply visible in the anterior and middle cerebral territories, with some deficiency in the left frontal region. Distal right ICA is abnormally small but does remain patent, supplying the right middle cerebral artery territory. Occlusion or severe stenosis of the right ACA.   2D Echo - EF 60 - 65%. No cardiac source of emboli identified.   [employer] Virus 2 - negative  LDL - 107  HgbA1c - 5.5  UDS - not ordered  VTE prophylaxis - Lovenox  aspirin 81 mg daily prior to admission, now on aspirin 325 mg daily and clopidogrel 75 mg daily DAPT for 3 months and then ASA alone given intracranial stenosis  Patient counseled to be compliant with her antithrombotic medications  Ongoing aggressive stroke risk factor management  Therapy recommendations:  pending  Disposition:  Pending  ?? Moyamoya disease, adult  type  MRA and CTA suggested circle of willis multivessel stenosis and occlusion  Pt sister had stroke in young and s/p cerebral stent  Plan cerebral angiogram with Dr. Estanislado Pandy tomorrow.   NPO since midnight  On DAPT  DDx including CNS vasculitis - however the large vessel involvement make it less likely - autoimmune labs - pending   Hypertension  Home BP meds: Zestril ; HCTZ ; metoprolol  Current BP meds: none   Stable . Permissive hypertension (OK if < 220/120) but gradually normalize in 5-7 days  . Long-term BP goal 130-150  Hyperlipidemia  Home Lipid lowering medication: none  LDL 107, goal < 70  Current lipid lowering medication: Lipitor 40 mg daily   Continue statin at discharge  Other Stroke Risk Factors  Previous ETOH use.  Other Active Problems  Code status - Full code  Abnormal and somewhat unusual appearing bilateral level 2 cervical lymph nodes. The nodes are slightly enlarged, enhance more than normal and show some internal heterogeneity. The nature of this is unclear. There is no older study for comparison. The single largest level 2 node on the left measures 20 x 12 x 10 mm, only mildly enlarged. These may simply be reactive to some systemic or head and neck inflammatory process by CTA.   Hospital day # 2  Rosalin Hawking, MD PhD Stroke Neurology 02/07/2020 11:35 AM    To contact Stroke Continuity provider, please refer to [email or web address] After hours, contact General Neurology

## 2020-02-07 NOTE — TOC CAGE-AID Note (Signed)
Transition of Care [facility]) - CAGE-AID Screening   Patient Details  Name: Jacqueline Medina MRN: [id number] Date of Birth: 08/09/1971  Transition of Care [facility]) CM/SW Contact:    Emeterio Reeve, [place] Phone Number: 02/07/2020, 3:30 PM   Clinical Narrative:  Pt denied alcohol use and substance use.   CAGE-AID Screening:    Have You Ever Felt You Ought to Cut Down on Your Drinking or Drug Use?: No Have People Annoyed You By Critizing Your Drinking Or Drug Use?: No Have You Felt Bad Or Guilty About Your Drinking Or Drug Use?: No Have You Ever Had a Drink or Used Drugs First Thing In The Morning to STeady Your Nerves or to Get Rid of a Hangover?: No CAGE-AID Score: 0       Blima Ledger, [place] Social Worker [phone]

## 2020-02-08 ENCOUNTER — Encounter: Payer: Self-pay | Admitting: Neurology

## 2020-02-08 ENCOUNTER — Inpatient Hospital Stay (HOSPITAL_COMMUNITY): Payer: BC Managed Care – PPO

## 2020-02-08 HISTORY — PX: IR ANGIO VERTEBRAL SEL VERTEBRAL UNI R MOD SED: IMG5368

## 2020-02-08 HISTORY — PX: IR ANGIO VERTEBRAL SEL SUBCLAVIAN INNOMINATE UNI L MOD SED: IMG5364

## 2020-02-08 HISTORY — PX: IR ANGIO VERTEBRAL SEL VERTEBRAL BILAT MOD SED: IMG5369

## 2020-02-08 HISTORY — PX: IR ANGIO INTRA EXTRACRAN SEL COM CAROTID INNOMINATE BILAT MOD SED: IMG5360

## 2020-02-08 HISTORY — PX: IR US GUIDE VASC ACCESS RIGHT: IMG2390

## 2020-02-08 LAB — BASIC METABOLIC PANEL
Anion gap: 7 (ref 5–15)
BUN: 11 mg/dL (ref 6–20)
CO2: 25 mmol/L (ref 22–32)
Calcium: 8.3 mg/dL — ABNORMAL LOW (ref 8.9–10.3)
Chloride: 107 mmol/L (ref 98–111)
Creatinine, Ser: 0.95 mg/dL (ref 0.44–1.00)
GFR calc Af Amer: >60 mL/min (ref >60)
GFR calc non Af Amer: >60 mL/min (ref >60)
Glucose, Bld: 103 mg/dL — ABNORMAL HIGH (ref 70–99)
Potassium: 4.3 mmol/L (ref 3.5–5.1)
Sodium: 139 mmol/L (ref 135–145)

## 2020-02-08 LAB — SICKLE CELL SCREEN: Sickle Cell Screen: NEGATIVE

## 2020-02-08 LAB — CBC
HCT: 38 % (ref 36.0–46.0)
Hemoglobin: 12.8 g/dL (ref 12.0–15.0)
MCH: 30.6 pg (ref 26.0–34.0)
MCHC: 33.7 g/dL (ref 30.0–36.0)
MCV: 90.9 fL (ref 80.0–100.0)
Platelets: 222 10*3/uL (ref 150–400)
RBC: 4.18 MIL/uL (ref 3.87–5.11)
RDW: 13.7 % (ref 11.5–15.5)
WBC: 4.9 10*3/uL (ref 4.0–10.5)
nRBC: 0 % (ref 0.0–0.2)

## 2020-02-08 LAB — MAGNESIUM: Magnesium: 1.9 mg/dL (ref 1.7–2.4)

## 2020-02-08 LAB — PHOSPHORUS: Phosphorus: 2.5 mg/dL (ref 2.5–4.6)

## 2020-02-08 IMAGING — US IR ANGIO INTRA EXTRACRAN SEL COM CAROTID INNOMINATE BILAT MOD SE
1 of 2 series · 11 of 24 positions shown · non-contrast
Comparison: MRI MRA of the brain and CT angiogram of the head and
neck [DATE].

CLINICAL DATA: Right-sided weakness with dysarthria. Occlusion of
the left internal carotid artery/left middle cerebral artery on CT
angiogram of the head and neck.

EXAM:
BILATERAL COMMON CAROTID AND INNOMINATE ANGIOGRAPHY
TECHNIQUE: Informed written consent was obtained from the patient after a
thorough discussion of the procedural risks, benefits and
alternatives. All questions were addressed. Maximal Sterile Barrier
Technique was utilized including caps, mask, sterile gowns, sterile
gloves, sterile drape, hand hygiene and skin antiseptic. A timeout
was performed prior to the initiation of the procedure.

[Series 300: dr. (person_name) · 11 of 216 slices shown]
[im 1/216]
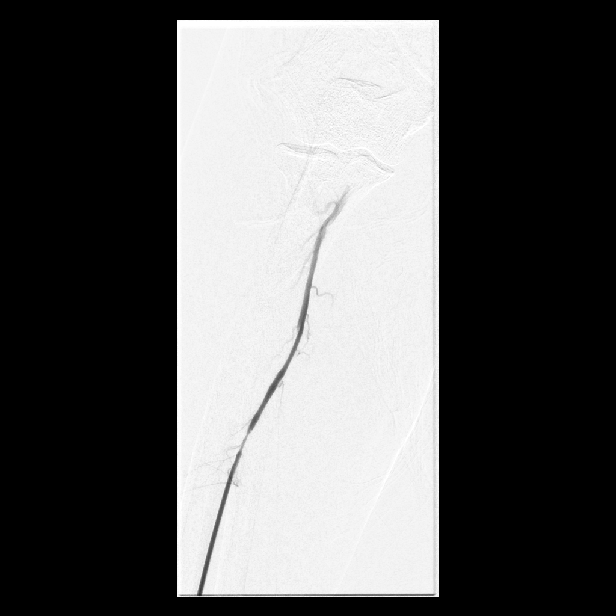
[im 20/216]
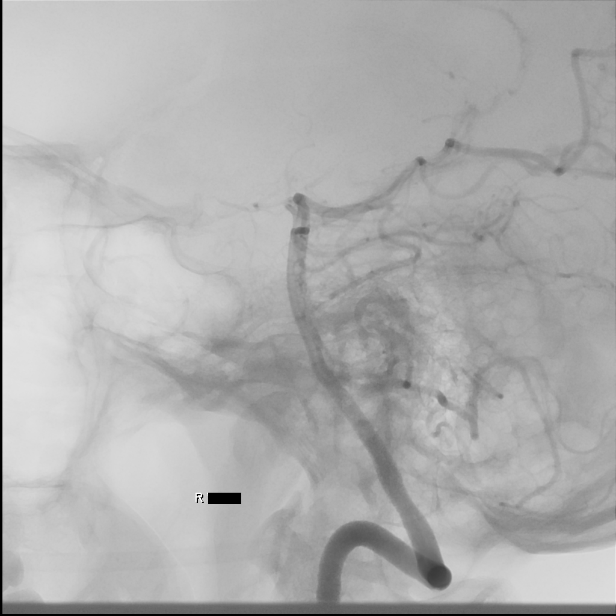
[im 40/216]
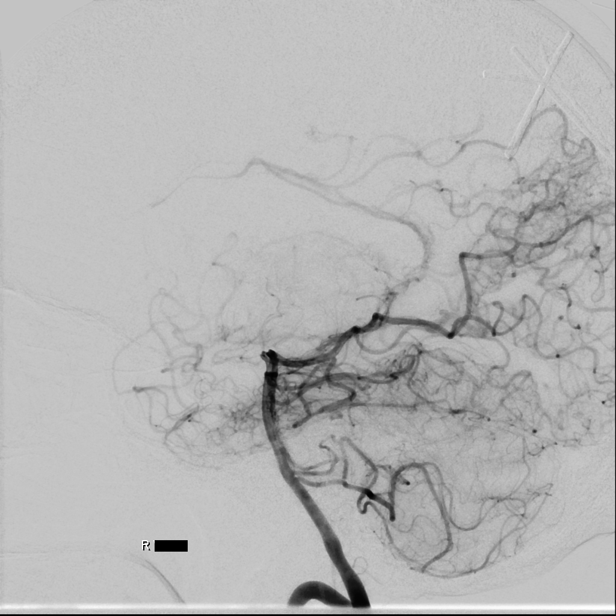
[im 59/216]
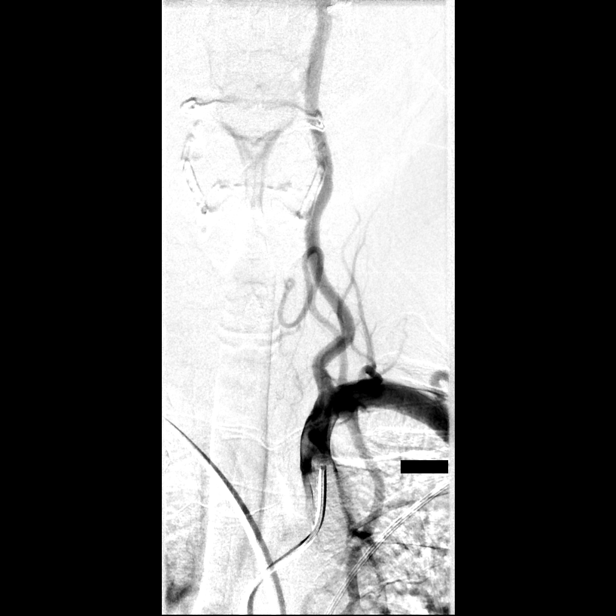
[im 79/216]
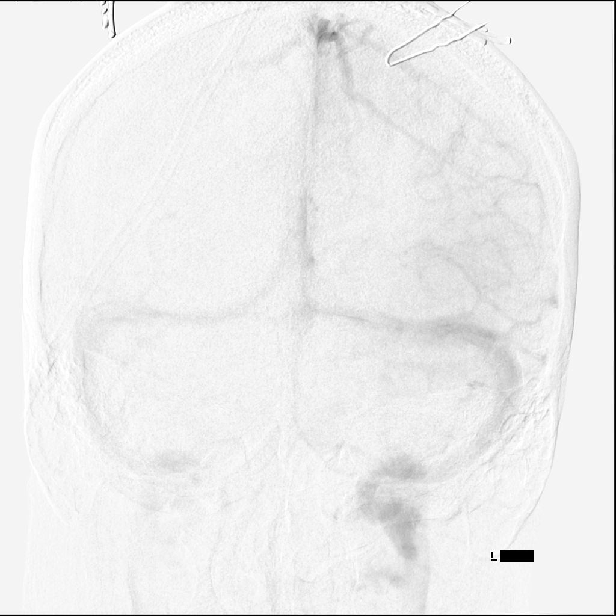
[im 108/216]
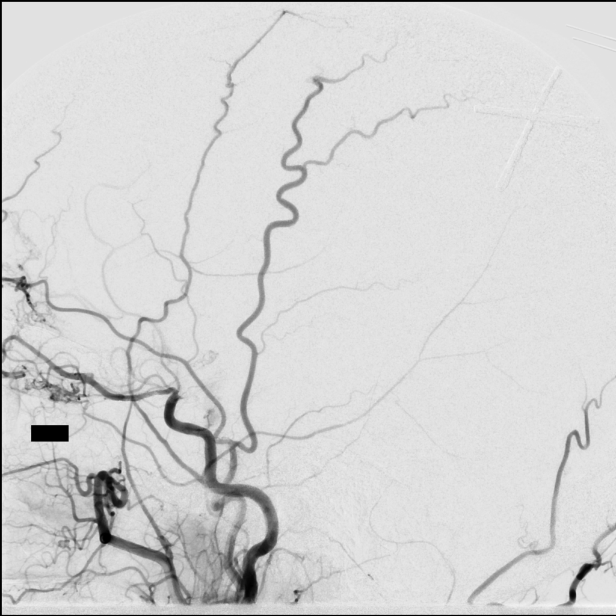
[im 128/216]
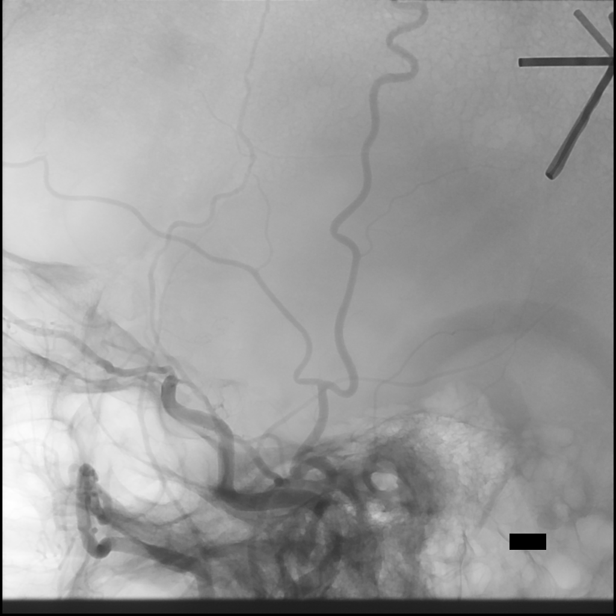
[im 147/216]
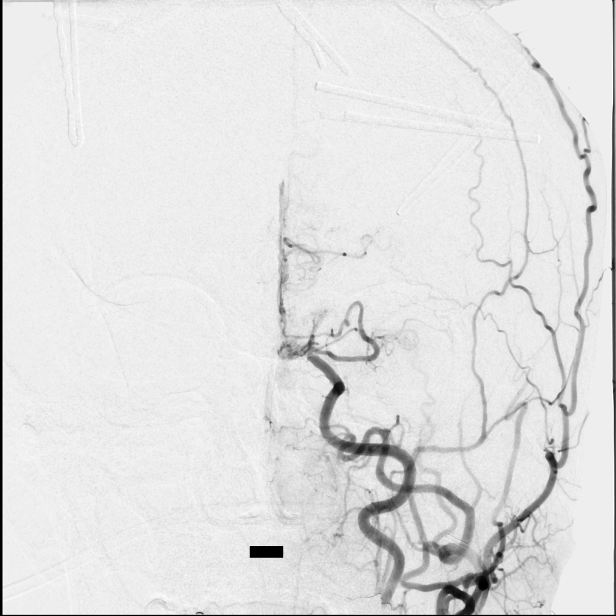
[im 167/216]
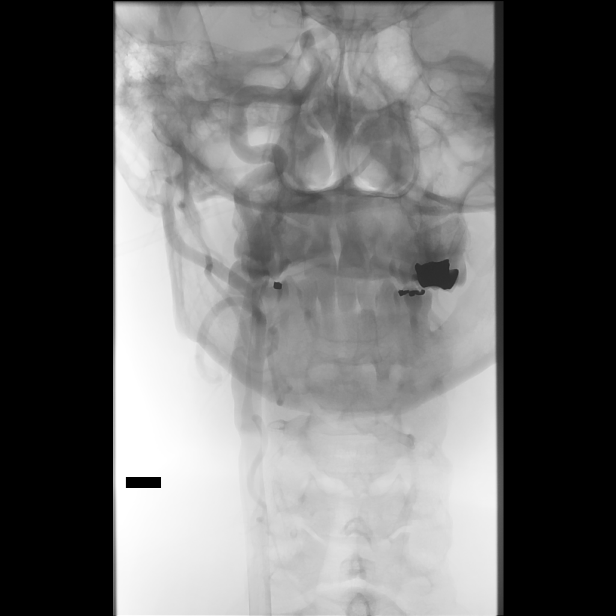
[im 186/216]
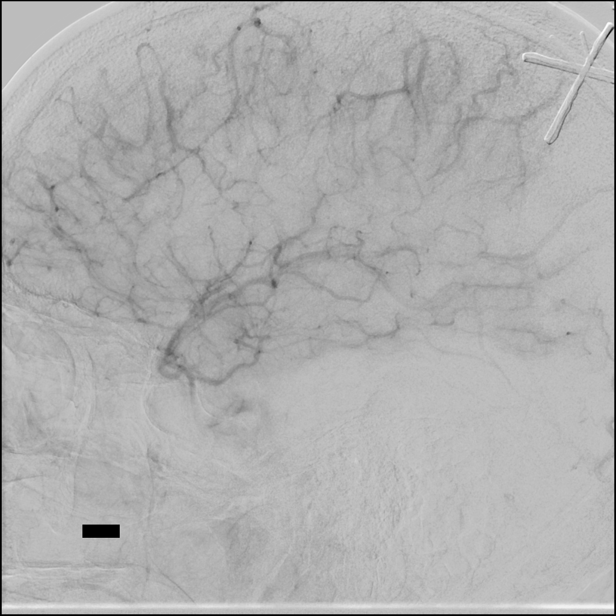
[im 206/216]
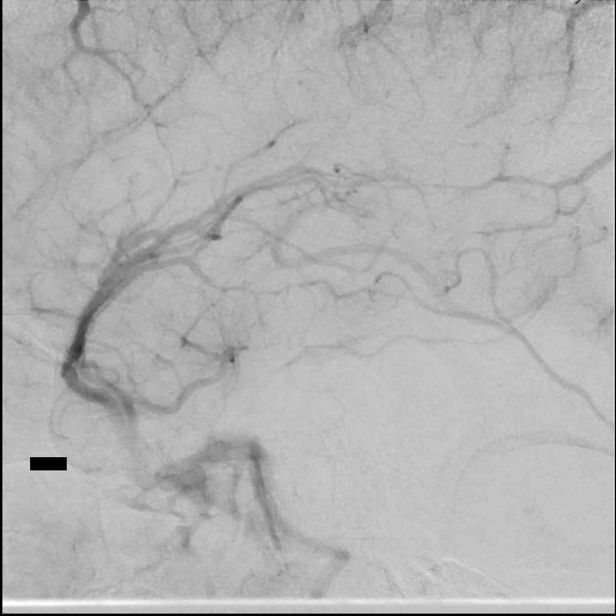

[11 of 24 positions shown; findings below may reference images not displayed]

MEDICATIONS:
Heparin [2L] units IA. No antibiotic was administered within 1 hour
of the procedure.

ANESTHESIA/SEDATION:
Versed 1 mg IV; Fentanyl 25 mcg IV

Moderate Sedation Time:  41 minutes

The patient was continuously monitored during the procedure by the
interventional radiology nurse under my direct supervision.

CONTRAST:  Isovue 300 approximately 60 mL.

FLUOROSCOPY TIME:  Fluoroscopy Time: 8 minutes 48 seconds (880 mGy).

COMPLICATIONS:
None immediate.
The right forearm to the wrist was prepped and draped in the usual
sterile manner.

The right radial artery was then identified and it's morphology
documented with ultrasound. A dorsal palmar anastomosis was verified
to be present.

Using ultrasound guidance and micropuncture technique, access into
the right radial artery was obtained over a 0.018 inch micro
guidewire. A [DATE] French radial sheath was then inserted without
difficulty. The obturator, and the micro guidewire were removed.
Good aspiration was obtained from the side port of the sheath. A
cocktail of [2L] units of heparin, 2.5 mg of Versed, and 200 mcg of
nitroglycerin was then infused through the sheath in diluted form
without event.

A right radial arteriogram was then obtained.

The patient was given an additional 200 mcg through the sheath for
relief of vaso spasm at the distal end of the sheath.

Over a 0.035 inch Roadrunner guidewire, a 5 JUMPER 2
diagnostic catheter was then advanced to the aortic arch region, and
select cannulation was then performed of the right vertebral artery,
the right common carotid artery, the left common carotid artery and
the left vertebral artery.

Following the termination of the procedure, the right radial access
site was sealed with a wrist band. Distal right radial pulse was
verified to be present.
FINDINGS: The origin of the right vertebral artery is widely patent. The
vessel is seen to opacify to the cranial skull base. Wide patency is
seen of the right vertebrobasilar junction and the right
posterior-inferior cerebellar artery.

The basilar artery, the posterior cerebral arteries, the superior
cerebellar arteries and the anterior-inferior cerebellar arteries
opacify into the capillary and venous phases. Unopacified blood is
seen in the basilar artery from the contralateral vertebral artery.

The whole head frontal and lateral imaging demonstrates retrograde
opacification of the right anterior cerebral artery and the left
anterior cerebral artery from prominent leptomeningeal collaterals
from the P3 segments of the posterior cerebral arteries, from the
medial and lateral thalamo perforators to the level of the A1 A2
junction.

Additionally there is retrograde opacification of the left MCA
distribution of the distal left M1 segment from retrograde
opacification from the posterior temporal branches of the left
posterior cerebral artery.

A left common carotid arteriogram demonstrates the left external
carotid artery and its major branches to be widely patent.

The left internal carotid artery at the bulb to the cranial skull
base appears mildly decompressed in caliber extending to the level
of the distal cavernous segment where there is complete occlusion of
the left internal carotid artery.

An abnormally prominent ophthalmic artery is seen with multiple
collaterals arising in the distal ophthalmic artery region
retrogradely opacifying the inferior frontal medial cortical region
with retrograde opacification of the frontal branches of the
anterior cerebral artery on the left side.

The right common carotid arteriogram demonstrates wide patency of
the right external carotid artery and its major branches.

There is mild narrowing of the right common carotid artery just
proximal to the bifurcation.

The right internal carotid artery also demonstrates a smooth shallow
plaque along the posterolateral wall without evidence of ulcerations
or of intraluminal filling defects. More distally the vessel is seen
to opacify normally to the cranial skull base. Wide patency is seen
of the petrous and the proximal segments. Distal to this there is a
tapered moderate narrowing to about 50% involving the right internal
carotid supraclinoid segment. The right middle cerebral artery
proximal M1 segment also demonstrates approximately 60% stenosis.
Early evidence of multiple collateral vessels was seen this region.

Distal to this the right MCA trifurcation branches appear widely
patent into the capillary and venous phases.

There is mild opacification of the right anterior A1 segment and A2
segments in the delayed arterial and early capillary phases from
multiple collaterals arising from the lateral and the medial and
lateral lenticular neovascularity.
IMPRESSION: Angiographically occluded left internal carotid artery at the level
of the ophthalmic artery. Retrograde collateralization of the
anterior cerebral artery distributions bilaterally via prominent P3
branches of the posterior cerebral arteries, and the thalamo
perforators bilaterally.

Retrograde opacification of the left MCA distribution from the
posterior temporal artery branches from the P2 segment of the
prominent left posterior cerebral artery.

Approximately 50% stenosis of the distal right ICA supraclinoid
segment.

Approximately 60% stenosis of the proximal M1 segment of the right
middle cerebral artery.

PLAN:
Findings reviewed with the patient and the patient's stroke
neurologist.

Follow-up CT angiogram of the head and neck in approximately 2-3
months. Patient advised to continue with Plavix 75 mg a day, and
aspirin 325 mg a day.

Clinic follow-up post CT angiogram of the head and neck.

## 2020-02-08 MED ORDER — ASPIRIN 325 MG PO TABS: 325.0000 mg | ORAL_TABLET | Freq: Every day | ORAL | 0 refills | Status: DC

## 2020-02-08 MED ORDER — SODIUM CHLORIDE 0.9 % IV SOLN: INTRAVENOUS | Status: DC

## 2020-02-08 MED ORDER — VERAPAMIL HCL 2.5 MG/ML IV SOLN
INTRAVENOUS | Status: AC
  Filled 2020-02-08: qty 2

## 2020-02-08 MED ORDER — CLOPIDOGREL BISULFATE 75 MG PO TABS: 75.0000 mg | ORAL_TABLET | Freq: Every day | ORAL | 0 refills | Status: DC

## 2020-02-08 MED ORDER — NITROGLYCERIN 1 MG/10 ML FOR IR/CATH LAB
INTRA_ARTERIAL | Status: AC | PRN
  Administered 2020-02-08 (×2): 200 ug via INTRA_ARTERIAL

## 2020-02-08 MED ORDER — LIDOCAINE HCL 1 % IJ SOLN
INTRAMUSCULAR | Status: AC
  Filled 2020-02-08: qty 20

## 2020-02-08 MED ORDER — METOPROLOL TARTRATE 25 MG PO TABS: 12.5000 mg | ORAL_TABLET | Freq: Two times a day (BID) | ORAL | 0 refills | Status: DC

## 2020-02-08 MED ORDER — VERAPAMIL HCL 2.5 MG/ML IV SOLN: INTRA_ARTERIAL | Status: AC | PRN

## 2020-02-08 MED ORDER — MIDAZOLAM HCL 2 MG/2ML IJ SOLN
INTRAMUSCULAR | Status: AC | PRN
  Administered 2020-02-08: 1 mg via INTRAVENOUS

## 2020-02-08 MED ORDER — LIDOCAINE HCL 1 % IJ SOLN
INTRAMUSCULAR | Status: AC | PRN
  Administered 2020-02-08: 5 mL

## 2020-02-08 MED ORDER — NITROGLYCERIN 1 MG/10 ML FOR IR/CATH LAB
INTRA_ARTERIAL | Status: AC
  Filled 2020-02-08: qty 10

## 2020-02-08 MED ORDER — MIDAZOLAM HCL 2 MG/2ML IJ SOLN
INTRAMUSCULAR | Status: AC
  Filled 2020-02-08: qty 2

## 2020-02-08 MED ORDER — HEPARIN SODIUM (PORCINE) 1000 UNIT/ML IJ SOLN
INTRAMUSCULAR | Status: AC
  Filled 2020-02-08: qty 1

## 2020-02-08 MED ORDER — IOHEXOL 300 MG/ML  SOLN
150.0000 mL | Freq: Once | INTRAMUSCULAR | Status: AC | PRN
  Administered 2020-02-08: 60 mL via INTRA_ARTERIAL

## 2020-02-08 MED ORDER — FENTANYL CITRATE (PF) 100 MCG/2ML IJ SOLN
INTRAMUSCULAR | Status: AC | PRN
  Administered 2020-02-08: 25 ug via INTRAVENOUS

## 2020-02-08 MED ORDER — FENTANYL CITRATE (PF) 100 MCG/2ML IJ SOLN
INTRAMUSCULAR | Status: AC
  Filled 2020-02-08: qty 2

## 2020-02-08 MED ORDER — ATORVASTATIN CALCIUM 80 MG PO TABS: 80.0000 mg | ORAL_TABLET | Freq: Every day | ORAL | 0 refills | Status: DC

## 2020-02-08 NOTE — Progress Notes (Signed)
OT Cancellation Note  Patient Details Name: Jacqueline Medina MRN: [id number] DOB: 07/03/1971   Cancelled Treatment:    Reason Eval/Treat Not Completed: Medical issues which prohibited therapy Pt needing to lay flat for the next three hours to complete test. Will follow up as time allows.  Corinne Ports E. Margherita Collyer, COTA/L [facility] [place] 02/08/2020, 1:18 PM

## 2020-02-08 NOTE — Discharge Summary (Addendum)
Discharge Summary  Jacqueline Medina [id number] DOB: 1971-05-27  PCP: Patient, No Pcp Per  Admit date: 02/05/2020 Discharge date: 02/08/2020  Time spent: 35 minutes  Recommendations for Outpatient Follow-up:  1. Follow-up with neurology, Dr. Leonie Man, in 4 weeks. 2. Follow-up with your PCP in 1 to 2 weeks. 3. Take your medications as prescribed. 4. Continue outpatient PT, OT, speech language, and possibly ortho PT for right hip pain.   Discharge Diagnoses:  Active Hospital Problems   Diagnosis Date Noted  . CVA (cerebral vascular accident) ([facility]) 02/05/2020  . Essential hypertension 02/05/2020  . Hyponatremia 02/05/2020  . Anxiety 02/05/2020    Resolved Hospital Problems  No resolved problems to display.    Discharge Condition: Stable  Diet recommendation: Heart healthy, DASH diet.  Vitals:   02/08/20 1450 02/08/20 1520  BP: 128/81 (!) 139/91  Pulse: (!) 101 100  Resp: 16 16  Temp:    SpO2: 100% 100%    History of present illness:  Jacqueline Huntis a 49 y.o.right-handedfemalewith medical history significant ofhypertension, chronic anxiety,who presentedwith weakness of her right hand starting 2 days prior to presentation. She noticed that she kept dropping things in her right hand and was unable to write. Patient knew what she wanted to say, but was having difficulty getting the words out. Associated symptoms include intermittent palpitations and fall about 1 week prior with corresponding right hip pain. She reports taking a daily aspirin.  Right hip x-ray done on 02/05/2020 showed degenerative changes without acute abnormality.  ED Course:CT scan of the brain revealed decreased attenuation in the left frontal lobe consistent with subacute ischemia.  Seen by neurology/stroke team.  [facility] asked to admit for stroke workup.  MRI brain without contrast and MRI head without contrast showed multiple small acute infarcts in the right ACA territory and left precentral gyrus.   Subacute left frontal infarct.  Moderately extensive chronic small vessel ischemic disease.  Occlusion of the distal left ICA, left MCA, and both ACAs with severe tandem stenosis of the distal right ICA.  Due to concern for possible moyamoya disease with its pattern of stenosis seen on MRA brain, IR was consulted by neurology for cerebral angiogram.  This was completed on 02/08/20 by Dr. Estanislado Pandy.  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    No need for stenting at this time, will follow up as outpatient with neurology.  Per neuro, okay to discharge.  Patient will continue on aspirin 325 mg daily and Plavix 75 mg daily x3 months then aspirin 325 mg daily alone.    02/08/20: Seen and examined.  She denies any motor or sensory deficits at this time.  She is back to her baseline.    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 Findings seen on MRI brain without contrast.  Also showing moderately extensive chronic small vessel ischemic disease.  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 Was on 81 mg daily aspirin at home. Continue aspirin 325 mg daily and Plavix 75 mg daily x3 months then aspirin alone as recommended by neurology given severe intracranial artherosclerosis.   On high intensity statin, Lipitor 80 mg daily, continue. LDL 107, goal less than 70 Hemoglobin  A1c 5.5, goal less than 7.0, at goal 2D echo showed LVEF 60 to [id number], grade 1 diastolic dysfunction, no cardiac source of emboli identified. OT recommended outpatient OT PT recommended Ortho PT for right hip pain. Follow-up with neurology  Possible Moyamoya disease, seen on MRA brain,  pattern confirmed by cerebral angiogram done on 02/08/2020 by interventional radiology Dr. Estanislado Pandy MRA and CTA suggested circle of willis multivessel stenosis and occlusion No need for stenting at this time, will follow-up with neurology outpatient. Continue secondary prevention management as recommended by neurology.  Essential hypertension Blood pressure stable Continue p.o. Lopressor 12.5 mg twice daily Follow-up with your PCP.  Chronic anxiety Stable Continue BuSpar  Resolved hypovolemic hyponatremia Received infusion of gentle IV fluid hydration normal saline. Serum sodium 139    Code Status:Full   Consults called:Neurology, interventional radiology    Discharge Exam: BP (!) 139/91 (BP Location: Left Arm)   Pulse 100   Temp 98.2 F (36.8 C) (Oral)   Resp 16   Ht 5\' 6"  (1.676 m)   Wt 70 kg   LMP 01/11/2020 (Approximate)   SpO2 100%   BMI 24.91 kg/m  . General: 49 y.o. year-old female well developed well nourished in no acute distress.  Alert and oriented x3. . Cardiovascular: Regular rate and rhythm with no rubs or gallops.  No thyromegaly or JVD noted.   Marland Kitchen Respiratory: Clear to auscultation with no wheezes or rales. Good inspiratory effort. . Abdomen: Soft nontender nondistended with normal bowel sounds x4 quadrants. . Musculoskeletal: No lower extremity edema. 2/4 pulses in all 4 extremities. Marland Kitchen Psychiatry: Mood is appropriate for condition and setting  Discharge Instructions You were cared for by a hospitalist during your hospital stay. If you have any questions about your discharge medications or the care you received while you were in the hospital after you are discharged, you can call the unit and asked to speak with the hospitalist on call if the hospitalist that took care of you is not available. Once you are discharged, your primary care physician will handle any further medical issues. Please note that NO REFILLS for any discharge medications will  be authorized once you are discharged, as it is imperative that you return to your primary care physician (or establish a relationship with a primary care physician if you do not have one) for your aftercare needs so that they can reassess your need for medications and monitor your lab values.  Discharge Instructions    Ambulatory referral to Neurology   Complete by: As directed    Follow up with Dr. Leonie Man at [facility] in 4 weeks. Too complicated for NP to follow. Thanks.   Ambulatory referral to Occupational Therapy   Complete by: As directed    Ambulatory referral to Physical Therapy   Complete by: As directed    Ambulatory referral to Speech Therapy   Complete by: As directed      Allergies as of 02/08/2020   No Known Allergies     Medication List    STOP taking these medications   aspirin EC 81 MG tablet Replaced by: aspirin 325 MG tablet   hydrochlorothiazide 25 MG tablet Commonly known as: HYDRODIURIL   lisinopril 5 MG tablet Commonly known as: ZESTRIL   naproxen 500 MG tablet Commonly known as: NAPROSYN   potassium chloride 20 MEQ packet Commonly known as: KLOR-CON     TAKE these medications   aspirin 325 MG tablet Take 1 tablet (325 mg total) by mouth daily.  Start taking on: Feb 09, 2020 Replaces: aspirin EC 81 MG tablet   atorvastatin 80 MG tablet Commonly known as: LIPITOR Take 1 tablet (80 mg total) by mouth daily. Start taking on: Feb 09, 2020   busPIRone 5 MG tablet Commonly known as: BUSPAR Take 5 mg by mouth in the morning and at bedtime.   clopidogrel 75 MG tablet Commonly known as: PLAVIX Take 1 tablet (75 mg total) by mouth daily. Start taking on: Feb 09, 2020   cyclobenzaprine 10 MG tablet Commonly known as: FLEXERIL Take 10 mg by mouth 3 (three) times daily as needed for muscle spasms.   metoprolol tartrate 25 MG tablet Commonly known as: LOPRESSOR Take 0.5 tablets (12.5 mg total) by mouth 2 (two) times daily. What changed: how much to  take   multivitamin with minerals Tabs tablet Take 1 tablet by mouth daily.      No Known Allergies Follow-up Information    [name], [name], MD Follow up on 02/14/2020.   Specialty: Family Medicine Why: Your appointment is at 3:00. Please arrive early and bring: your insurance care, picture ID, current medications. Contact information: [place] [address] [phone]        Garvin Fila, MD. Schedule an appointment as soon as possible for a visit in 4 week(s).   Specialties: Neurology, Radiology Contact information: [address] [place] [address] [phone]            The results of significant diagnostics from this hospitalization (including imaging, microbiology, ancillary and laboratory) are listed below for reference.    Significant Diagnostic Studies: CT ANGIO HEAD W OR WO CONTRAST  Addendum Date: 02/06/2020   ADDENDUM REPORT: 02/06/2020 10:10 ADDENDUM: The impression should also include note of abnormal and somewhat unusual appearing bilateral level 2 cervical lymph nodes. The nodes are slightly enlarged, enhance more than normal and show some internal heterogeneity. The nature of this is unclear. There is no older study for comparison. The single largest level 2 node on the left measures 20 x 12 x 10 mm, only mildly enlarged. These may simply be reactive to some systemic or head and neck inflammatory process. Electronically Signed   By: Nelson Chimes M.D.   On: 02/06/2020 10:10   Result Date: 02/06/2020 CLINICAL DATA:  Aphasia. Right arm and leg weakness. MRI showed acute right ACA territory strokes and left frontal stroke. EXAM: CT ANGIOGRAPHY HEAD AND NECK TECHNIQUE: Multidetector CT imaging of the head and neck was performed using the standard protocol during bolus administration of intravenous contrast. Multiplanar CT image reconstructions and MIPs were obtained to evaluate the vascular anatomy. Carotid stenosis  measurements (when applicable) are obtained utilizing NASCET criteria, using the distal internal carotid diameter as the denominator. CONTRAST:  56mL OMNIPAQUE IOHEXOL 350 MG/ML SOLN COMPARISON:  MRI yesterday. FINDINGS: CTA NECK FINDINGS Aortic arch: Aorta appears normal without visible atherosclerotic change. Branching pattern is normal without origin stenosis. Right carotid system: Common carotid artery widely patent to the bifurcation. Carotid bifurcation is normal without soft or calcified plaque. Cervical ICA is normal. Left carotid system: Common carotid artery widely patent to the bifurcation. Carotid bifurcation is normal. Narrowing of the left ICA beginning at the distal bulb and extending through the skull base. This is secondary to out flow restriction. See below. Vertebral arteries: Both vertebral artery origins widely patent. Both vertebral arteries normal through the cervical region. Skeleton: Normal Other neck: Abnormal appearing bilateral level 2 nodes showing heterogeneous nature  and enhancement. Petra Kuba of this is uncertain. Largest node is a left level 2 node measuring 20 x 12 x 10 mm. Upper chest: Normal Review of the MIP images confirms the above findings CTA HEAD FINDINGS Anterior circulation: Right internal carotid artery is patent through the skull base. The vessel is narrowed in the siphon region, right anterior cerebral artery is either occluded or extremely small. Left ICA is a very small vessel, only 1 mm, patent through the skull base and siphon region but occluded at the supraclinoid ICA level. There is collateral or reconstituted flow in the left MCA territory. Less vigorous reconstituted or collateral flow in the left ACA territory region. Posterior circulation: Both vertebral arteries are patent to the basilar. No basilar stenosis. Posterior circulation branch vessels appear patent and normal. Venous sinuses: Patent and normal. Anatomic variants: None significant. Review of the MIP  images confirms the above findings IMPRESSION: No sign of atherosclerotic vascular disease. Left supraclinoid ICA occlusion. Reconstituted/collateral supply visible in the anterior and middle cerebral territories, with some deficiency in the left frontal region. Distal right ICA is abnormally small but does remain patent, supplying the right middle cerebral artery territory. Occlusion or severe stenosis of the right ACA. Reconstituted/collateral distal flow. The differential diagnosis in this case is that of moyamoya disease versus is some other sort of vasculitis. 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 Electronically Signed: By: Nelson Chimes M.D. On: 02/06/2020 08:49   CT HEAD WO CONTRAST  Addendum Date: 02/05/2020   ADDENDUM REPORT: 02/05/2020 14:53 ADDENDUM: The findings and impression should read left frontal lobe not right. Electronically Signed   By: Inez Catalina M.D.   On: 02/05/2020 14:53   Result Date: 02/05/2020 CLINICAL DATA:  Right hand weakness for 2 days, initial encounter EXAM: CT HEAD WITHOUT CONTRAST TECHNIQUE: Contiguous axial images were obtained from the base of the skull through the vertex without intravenous contrast. COMPARISON:  None. FINDINGS: Brain: There is an area of geographic decreased attenuation identified in the right frontal lobe near the vertex consistent with subacute ischemia. No other focal infarct is seen. No hemorrhage or mass lesion is noted. Vascular: No hyperdense vessel or unexpected calcification. Skull: Normal. Negative for fracture or focal lesion. Sinuses/Orbits: No acute finding. Other: None. IMPRESSION: Area of decreased attenuation in the right frontal lobe consistent with subacute ischemia. No other focal abnormality is noted. Electronically Signed: By: Inez Catalina M.D. On: 02/05/2020 13:10   CT ANGIO NECK W OR WO CONTRAST  Addendum Date: 02/06/2020   ADDENDUM REPORT: 02/06/2020 10:10 ADDENDUM: The impression should also include note of  abnormal and somewhat unusual appearing bilateral level 2 cervical lymph nodes. The nodes are slightly enlarged, enhance more than normal and show some internal heterogeneity. The nature of this is unclear. There is no older study for comparison. The single largest level 2 node on the left measures 20 x 12 x 10 mm, only mildly enlarged. These may simply be reactive to some systemic or head and neck inflammatory process. Electronically Signed   By: Nelson Chimes M.D.   On: 02/06/2020 10:10   Result Date: 02/06/2020 CLINICAL DATA:  Aphasia. Right arm and leg weakness. MRI showed acute right ACA territory strokes and left frontal stroke. EXAM: CT ANGIOGRAPHY HEAD AND NECK TECHNIQUE: Multidetector CT imaging of the head and neck was performed using the standard protocol during bolus administration of intravenous contrast. Multiplanar CT image reconstructions and MIPs were obtained to evaluate the vascular anatomy. Carotid stenosis  measurements (when applicable) are obtained utilizing NASCET criteria, using the distal internal carotid diameter as the denominator. CONTRAST:  20mL OMNIPAQUE IOHEXOL 350 MG/ML SOLN COMPARISON:  MRI yesterday. FINDINGS: CTA NECK FINDINGS Aortic arch: Aorta appears normal without visible atherosclerotic change. Branching pattern is normal without origin stenosis. Right carotid system: Common carotid artery widely patent to the bifurcation. Carotid bifurcation is normal without soft or calcified plaque. Cervical ICA is normal. Left carotid system: Common carotid artery widely patent to the bifurcation. Carotid bifurcation is normal. Narrowing of the left ICA beginning at the distal bulb and extending through the skull base. This is secondary to out flow restriction. See below. Vertebral arteries: Both vertebral artery origins widely patent. Both vertebral arteries normal through the cervical region. Skeleton: Normal Other neck: Abnormal appearing bilateral level 2 nodes showing heterogeneous  nature and enhancement. Petra Kuba of this is uncertain. Largest node is a left level 2 node measuring 20 x 12 x 10 mm. Upper chest: Normal Review of the MIP images confirms the above findings CTA HEAD FINDINGS Anterior circulation: Right internal carotid artery is patent through the skull base. The vessel is narrowed in the siphon region, right anterior cerebral artery is either occluded or extremely small. Left ICA is a very small vessel, only 1 mm, patent through the skull base and siphon region but occluded at the supraclinoid ICA level. There is collateral or reconstituted flow in the left MCA territory. Less vigorous reconstituted or collateral flow in the left ACA territory region. Posterior circulation: Both vertebral arteries are patent to the basilar. No basilar stenosis. Posterior circulation branch vessels appear patent and normal. Venous sinuses: Patent and normal. Anatomic variants: None significant. Review of the MIP images confirms the above findings IMPRESSION: No sign of atherosclerotic vascular disease. Left supraclinoid ICA occlusion. Reconstituted/collateral supply visible in the anterior and middle cerebral territories, with some deficiency in the left frontal region. Distal right ICA is abnormally small but does remain patent, supplying the right middle cerebral artery territory. Occlusion or severe stenosis of the right ACA. Reconstituted/collateral distal flow. The differential diagnosis in this case is that of moyamoya disease versus is some other sort of vasculitis. 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 Electronically Signed: By: Nelson Chimes M.D. On: 02/06/2020 08:49   MR ANGIO HEAD WO CONTRAST  Result Date: 02/05/2020 CLINICAL DATA:  Mild aphasia. Transient right arm and leg weakness and left face numbness. EXAM: MRI HEAD WITHOUT CONTRAST MRA HEAD WITHOUT CONTRAST TECHNIQUE: Multiplanar, multiecho pulse sequences of the brain and surrounding structures were obtained  without intravenous contrast. Angiographic images of the head were obtained using MRA technique without contrast. COMPARISON:  Head CT 02/05/2020 FINDINGS: MRI HEAD FINDINGS Brain: There are multiple punctate acute infarcts involving medial right frontal lobe cortex and subcortical white matter (ACA territory), right periatrial white matter, and left precentral gyrus. Additionally, there is a more confluent region of milder diffusion weighted signal abnormality in the left frontal lobe involving cortex and white matter measuring approximately 3 cm in size with T2 hyperintensity, facilitated diffusion, and petechial hemorrhage compatible with a subacute infarct and corresponding to the abnormality on CT. T2 hyperintensities elsewhere in the cerebral white matter bilaterally are nonspecific but compatible with moderately age advanced chronic small vessel ischemic disease. There is also a late subacute to chronic left basal ganglia infarct with chronic blood products. The ventricles and sulci are normal. No mass, midline shift, or extra-axial fluid collection is identified. Vascular: Abnormal appearance of the left  ICA, more fully evaluated below. Skull and upper cervical spine: Unremarkable bone marrow signal. Sinuses/Orbits: Unremarkable orbits. Paranasal sinuses and mastoid air cells are clear. Other: None. MRA HEAD FINDINGS The visualized distal to the basilar and codominant vertebral arteries are widely patent. Patent PICAs, AICAs, and SCAs are seen bilaterally. The basilar artery is widely patent. There are medium sized posterior communicating arteries bilaterally with diminished or absent flow related enhancement proximally on the left. The PCAs are patent without evidence of significant proximal stenosis. The intracranial right internal carotid artery is patent with severe tandem stenosis involving the distal supraclinoid segment and terminus. The intracranial left ICA is patent proximally but small and is  occluded from the proximal supraclinoid segment through the terminus. The left MCA and both ACAs are occluded. The right MCA is patent with a severe stenosis at its origin. No aneurysm is identified. IMPRESSION: 1. 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 2. Subacute left frontal infarct. 3. Moderately extensive chronic small vessel ischemic disease. 4. 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 Electronically Signed   By: Logan Bores M.D.   On: 02/05/2020 19:08   MR BRAIN WO CONTRAST  Result Date: 02/05/2020 CLINICAL DATA:  Mild aphasia. Transient right arm and leg weakness and left face numbness. EXAM: MRI HEAD WITHOUT CONTRAST MRA HEAD WITHOUT CONTRAST TECHNIQUE: Multiplanar, multiecho pulse sequences of the brain and surrounding structures were obtained without intravenous contrast. Angiographic images of the head were obtained using MRA technique without contrast. COMPARISON:  Head CT 02/05/2020 FINDINGS: MRI HEAD FINDINGS Brain: There are multiple punctate acute infarcts involving medial right frontal lobe cortex and subcortical white matter (ACA territory), right periatrial white matter, and left precentral gyrus. Additionally, there is a more confluent region of milder diffusion weighted signal abnormality in the left frontal lobe involving cortex and white matter measuring approximately 3 cm in size with T2 hyperintensity, facilitated diffusion, and petechial hemorrhage compatible with a subacute infarct and corresponding to the abnormality on CT. T2 hyperintensities elsewhere in the cerebral white matter bilaterally are nonspecific but compatible with moderately age advanced chronic small vessel ischemic disease. There is also a late subacute to chronic left basal ganglia infarct with chronic blood products. The ventricles and sulci are normal. No mass, midline shift, or  extra-axial fluid collection is identified. Vascular: Abnormal appearance of the left ICA, more fully evaluated below. Skull and upper cervical spine: Unremarkable bone marrow signal. Sinuses/Orbits: Unremarkable orbits. Paranasal sinuses and mastoid air cells are clear. Other: None. MRA HEAD FINDINGS The visualized distal to the basilar and codominant vertebral arteries are widely patent. Patent PICAs, AICAs, and SCAs are seen bilaterally. The basilar artery is widely patent. There are medium sized posterior communicating arteries bilaterally with diminished or absent flow related enhancement proximally on the left. The PCAs are patent without evidence of significant proximal stenosis. The intracranial right internal carotid artery is patent with severe tandem stenosis involving the distal supraclinoid segment and terminus. The intracranial left ICA is patent proximally but small and is occluded from the proximal supraclinoid segment through the terminus. The left MCA and both ACAs are occluded. The right MCA is patent with a severe stenosis at its origin. No aneurysm is identified. IMPRESSION: 1. 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 2. Subacute left frontal infarct. 3. Moderately extensive chronic small vessel ischemic disease.  4. 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 Electronically Signed   By: Logan Bores M.D.   On: 02/05/2020 19:08   ECHOCARDIOGRAM COMPLETE  Result Date: 02/06/2020    ECHOCARDIOGRAM REPORT   Patient Name:   Jacqueline Medina Date of Exam: 02/06/2020 Medical Rec #:  [id number]   Height:       66.0 in Accession #:    [id number]  Weight:       154.3 lb Date of Birth:  August 16, 1971   BSA:          1.791 m Patient Age:    39 years    BP:           123/77 mmHg Patient Gender: F           HR:           104 bpm. Exam Location:  Inpatient Procedure: 2D Echo, Cardiac  Doppler and Color Doppler Indications:    Stroke  History:        Patient has no prior history of Echocardiogram examinations.                 Risk Factors:Hypertension.  Sonographer:    Clayton Lefort RDCS (AE) Referring Phys: [id number] RONDELL A SMITH IMPRESSIONS  1. Left ventricular ejection fraction, by estimation, is 60 to 65%. The left ventricle has normal function. The left ventricle has no regional wall motion abnormalities. There is mild asymmetric left ventricular hypertrophy of the basal and septal segments. Left ventricular diastolic parameters are consistent with Grade I diastolic dysfunction (impaired relaxation).  2. Right ventricular systolic function is normal. The right ventricular size is normal. There is normal pulmonary artery systolic pressure.  3. The mitral valve is normal in structure. Trivial mitral valve regurgitation. No evidence of mitral stenosis.  4. The aortic valve is tricuspid. Aortic valve regurgitation is not visualized. Mild aortic valve sclerosis is present, with no evidence of aortic valve stenosis.  5. The inferior vena cava is normal in size with greater than 50% respiratory variability, suggesting right atrial pressure of 3 mmHg. FINDINGS  Left Ventricle: Left ventricular ejection fraction, by estimation, is 60 to 65%. The left ventricle has normal function. The left ventricle has no regional wall motion abnormalities. The left ventricular internal cavity size was normal in size. There is  mild asymmetric left ventricular hypertrophy of the basal and septal segments. Left ventricular diastolic parameters are consistent with Grade I diastolic dysfunction (impaired relaxation). Right Ventricle: The right ventricular size is normal. No increase in right ventricular wall thickness. Right ventricular systolic function is normal. There is normal pulmonary artery systolic pressure. The tricuspid regurgitant velocity is 2.22 m/s, and  with an assumed right atrial pressure of 8 mmHg, the  estimated right ventricular systolic pressure is [id number] mmHg. Left Atrium: Left atrial size was normal in size. Right Atrium: Right atrial size was normal in size. Pericardium: There is no evidence of pericardial effusion. Mitral Valve: The mitral valve is normal in structure. There is mild thickening of the mitral valve leaflet(s). There is mild calcification of the mitral valve leaflet(s). Normal mobility of the mitral valve leaflets. Trivial mitral valve regurgitation. No evidence of mitral valve stenosis. MV peak gradient, 9.7 mmHg. The mean mitral valve gradient is 4.0 mmHg. Tricuspid Valve: The tricuspid valve is normal in structure. Tricuspid valve regurgitation is trivial. No evidence of tricuspid stenosis. Aortic Valve: The aortic  valve is tricuspid. Aortic valve regurgitation is not visualized. Mild aortic valve sclerosis is present, with no evidence of aortic valve stenosis. Aortic valve mean gradient measures 7.0 mmHg. Aortic valve peak gradient measures 12.8 mmHg. Aortic valve area, by VTI measures 1.76 cm. Pulmonic Valve: The pulmonic valve was normal in structure. Pulmonic valve regurgitation is trivial. No evidence of pulmonic stenosis. Aorta: The aortic root is normal in size and structure. Venous: The inferior vena cava is normal in size with greater than 50% respiratory variability, suggesting right atrial pressure of 3 mmHg. IAS/Shunts: No atrial level shunt detected by color flow Doppler.  LEFT VENTRICLE PLAX 2D LVIDd:         3.50 cm  Diastology LVIDs:         2.20 cm  LV e' lateral:   9.68 cm/s LV PW:         1.20 cm  LV E/e' lateral: 9.7 LV IVS:        1.40 cm  LV e' medial:    6.96 cm/s LVOT diam:     1.60 cm  LV E/e' medial:  13.5 LV SV:         51 LV SV Index:   29 LVOT Area:     2.01 cm  RIGHT VENTRICLE             IVC RV Basal diam:  3.40 cm     IVC diam: 1.20 cm RV S prime:     12.20 cm/s TAPSE (M-mode): 2.2 cm LEFT ATRIUM           Index       RIGHT ATRIUM           Index LA diam:       3.10 cm 1.73 cm/m  RA Area:     10.60 cm LA Vol (A4C): 39.9 ml 22.28 ml/m RA Volume:   23.10 ml  12.90 ml/m  AORTIC VALVE AV Area (Vmax):    1.61 cm AV Area (Vmean):   1.48 cm AV Area (VTI):     1.76 cm AV Vmax:           179.00 cm/s AV Vmean:          122.000 cm/s AV VTI:            0.290 m AV Peak Grad:      12.8 mmHg AV Mean Grad:      7.0 mmHg LVOT Vmax:         143.00 cm/s LVOT Vmean:        89.500 cm/s LVOT VTI:          0.254 m LVOT/AV VTI ratio: 0.88  AORTA Ao Root diam: 2.70 cm Ao Asc diam:  2.90 cm MITRAL VALVE                TRICUSPID VALVE MV Area (PHT): 4.63 cm     TR Peak grad:   19.7 mmHg MV Peak grad:  9.7 mmHg     TR Vmax:        222.00 cm/s MV Mean grad:  4.0 mmHg MV Vmax:       1.56 m/s     SHUNTS MV Vmean:      86.8 cm/s    Systemic VTI:  0.25 m MV Decel Time: 164 msec     Systemic Diam: 1.60 cm MV E velocity: 94.30 cm/s MV A velocity: 137.00 cm/s MV E/A ratio:  0.69 Jenkins Rouge MD Electronically signed by  Jenkins Rouge MD Signature Date/Time: 02/06/2020/10:18:54 AM    Final    DG Hip Unilat W or Wo Pelvis 2-3 Views Right  Result Date: 02/05/2020 CLINICAL DATA:  Fall 1 week ago with persistent right hip pain, initial encounter EXAM: DG HIP (WITH OR WITHOUT PELVIS) 2-3V RIGHT COMPARISON:  None. FINDINGS: Pelvic ring is intact. Mild degenerative changes of the hip joints are noted bilaterally. No acute fracture or dislocation is seen. No soft tissue abnormality is noted. IMPRESSION: Degenerative change without acute abnormality. Electronically Signed   By: Inez Catalina M.D.   On: 02/05/2020 15:14    Microbiology: Recent Results (from the past 240 hour(s))  SARS CORONAVIRUS 2 (TAT 6-24 HRS) Nasopharyngeal Nasopharyngeal Swab     Status: None   Collection Time: 02/05/20  7:15 PM   Specimen: Nasopharyngeal Swab  Result Value Ref Range Status   SARS Coronavirus 2 NEGATIVE NEGATIVE Final    Comment: (NOTE) SARS-CoV-2 target nucleic acids are NOT DETECTED. The SARS-CoV-2 RNA is  generally detectable in upper and lower respiratory specimens during the acute phase of infection. Negative results do not preclude SARS-CoV-2 infection, do not rule out co-infections with other pathogens, and should not be used as the sole basis for treatment or other patient management decisions. Negative results must be combined with clinical observations, patient history, and epidemiological information. The expected result is Negative. Fact Sheet for Patients: SugarRoll.be Fact Sheet for Healthcare Providers: https://www.woods-mathews.com/ This test is not yet approved or cleared by the [place] FDA and  has been authorized for detection and/or diagnosis of SARS-CoV-2 by FDA under an Emergency Use Authorization (EUA). This EUA will remain  in effect (meaning this test can be used) for the duration of the COVID-19 declaration under Section 56 4(b)(1) of the Act, 21 U.S.C. section 360bbb-3(b)(1), unless the authorization is terminated or revoked sooner. Performed at [facility], [place] [address], [place], [place] [address]      Labs: Basic Metabolic Panel: Recent Labs  Lab 02/05/20 1228 02/05/20 1259 02/08/20 0316  NA 133* 136 139  K 4.4 4.4 4.3  CL 97* 99 107  CO2 27  --  25  GLUCOSE 89 83 103*  BUN 18 21* 11  CREATININE 1.12* 1.10* 0.95  CALCIUM 8.5*  --  8.3*  MG  --   --  1.9  PHOS  --   --  2.5   Liver Function Tests: Recent Labs  Lab 02/05/20 1228  AST 17  ALT 18  ALKPHOS 84  BILITOT 1.0  PROT 6.6  ALBUMIN 3.2*   No results for input(s): LIPASE, AMYLASE in the last 168 hours. No results for input(s): AMMONIA in the last 168 hours. CBC: Recent Labs  Lab 02/05/20 1228 02/05/20 1259 02/08/20 0316  WBC 10.0  --  4.9  NEUTROABS 7.0  --   --   HGB 14.5 14.6 12.8  HCT 42.9 43.0 38.0  MCV 91.3  --  90.9  PLT 301  --  222   Cardiac Enzymes: No results for input(s): CKTOTAL, CKMB, CKMBINDEX,  TROPONINI in the last 168 hours. BNP: BNP (last 3 results) No results for input(s): BNP in the last 8760 hours.  ProBNP (last 3 results) No results for input(s): PROBNP in the last 8760 hours.  CBG: No results for input(s): GLUCAP in the last 168 hours.     Signed:  Kayleen Memos, MD [facility] 02/08/2020, 4:31 PM

## 2020-02-08 NOTE — TOC Transition Note (Signed)
Transition of Care [facility]) - CM/SW Discharge Note   Patient Details  Name: Jacqueline Medina MRN: [id number] Date of Birth: 15-Feb-1971  Transition of Care [facility]) CM/SW Contact:  Pollie Friar, RN Phone Number: 02/08/2020, 4:34 PM   Clinical Narrative:    Pt has decided she does want outpatient therapy. She lives in [place], [place] and would like the rehab close to home. [facility] in [place] has both needed therapies. Pt in agreement. CM faxed pts information to the outpatient rehab. They will contact her for the first appointment.  Pt has transportation home.    Final next level of care: OP Rehab Barriers to Discharge: No Barriers Identified   Patient Goals and CMS Choice        Discharge Placement                       Discharge Plan and Services   Discharge Planning Services: CM Consult                                 Social Determinants of Health (SDOH) Interventions     Readmission Risk Interventions No flowsheet data found.

## 2020-02-08 NOTE — Discharge Instructions (Addendum)
DASH Eating Plan DASH stands for "Dietary Approaches to Stop Hypertension." The DASH eating plan is a healthy eating plan that has been shown to reduce high blood pressure (hypertension). It may also reduce your risk for type 2 diabetes, heart disease, and stroke. The DASH eating plan may also help with weight loss. What are tips for following this plan?  General guidelines  Avoid eating more than 2,300 mg (milligrams) of salt (sodium) a day. If you have hypertension, you may need to reduce your sodium intake to 1,500 mg a day.  Limit alcohol intake to no more than 1 drink a day for nonpregnant women and 2 drinks a day for men. One drink equals 12 oz of beer, 5 oz of wine, or 1 oz of hard liquor.  Work with your health care provider to maintain a healthy body weight or to lose weight. Ask what an ideal weight is for you.  Get at least 30 minutes of exercise that causes your heart to beat faster (aerobic exercise) most days of the week. Activities may include walking, swimming, or biking.  Work with your health care provider or diet and nutrition specialist (dietitian) to adjust your eating plan to your individual calorie needs. Reading food labels   Check food labels for the amount of sodium per serving. Choose foods with less than 5 percent of the Daily Value of sodium. Generally, foods with less than 300 mg of sodium per serving fit into this eating plan.  To find whole grains, look for the word "whole" as the first word in the ingredient list. Shopping  Buy products labeled as "low-sodium" or "no salt added."  Buy fresh foods. Avoid canned foods and premade or frozen meals. Cooking  Avoid adding salt when cooking. Use salt-free seasonings or herbs instead of table salt or sea salt. Check with your health care provider or pharmacist before using salt substitutes.  Do not fry foods. Cook foods using healthy methods such as baking, boiling, grilling, and broiling instead.  Cook with  heart-healthy oils, such as olive, canola, soybean, or sunflower oil. Meal planning  Eat a balanced diet that includes: ? 5 or more servings of fruits and vegetables each day. At each meal, try to fill half of your plate with fruits and vegetables. ? Up to 6-8 servings of whole grains each day. ? Less than 6 oz of lean meat, poultry, or fish each day. A 3-oz serving of meat is about the same size as a deck of cards. One egg equals 1 oz. ? 2 servings of low-fat dairy each day. ? A serving of nuts, seeds, or beans 5 times each week. ? Heart-healthy fats. Healthy fats called Omega-3 fatty acids are found in foods such as flaxseeds and coldwater fish, like sardines, salmon, and mackerel.  Limit how much you eat of the following: ? Canned or prepackaged foods. ? Food that is high in trans fat, such as fried foods. ? Food that is high in saturated fat, such as fatty meat. ? Sweets, desserts, sugary drinks, and other foods with added sugar. ? Full-fat dairy products.  Do not salt foods before eating.  Try to eat at least 2 vegetarian meals each week.  Eat more home-cooked food and less restaurant, buffet, and fast food.  When eating at a restaurant, ask that your food be prepared with less salt or no salt, if possible. What foods are recommended? The items listed may not be a complete list. Talk with your dietitian about   what dietary choices are best for you. Grains Whole-grain or whole-wheat bread. Whole-grain or whole-wheat pasta. Brown rice. Oatmeal. Quinoa. Bulgur. Whole-grain and low-sodium cereals. Pita bread. Low-fat, low-sodium crackers. Whole-wheat flour tortillas. Vegetables Fresh or frozen vegetables (raw, steamed, roasted, or grilled). Low-sodium or reduced-sodium tomato and vegetable juice. Low-sodium or reduced-sodium tomato sauce and tomato paste. Low-sodium or reduced-sodium canned vegetables. Fruits All fresh, dried, or frozen fruit. Canned fruit in natural juice (without  added sugar). Meat and other protein foods Skinless chicken or turkey. Ground chicken or turkey. Pork with fat trimmed off. Fish and seafood. Egg whites. Dried beans, peas, or lentils. Unsalted nuts, nut butters, and seeds. Unsalted canned beans. Lean cuts of beef with fat trimmed off. Low-sodium, lean deli meat. Dairy Low-fat (1%) or fat-free (skim) milk. Fat-free, low-fat, or reduced-fat cheeses. Nonfat, low-sodium ricotta or cottage cheese. Low-fat or nonfat yogurt. Low-fat, low-sodium cheese. Fats and oils Soft margarine without trans fats. Vegetable oil. Low-fat, reduced-fat, or light mayonnaise and salad dressings (reduced-sodium). Canola, safflower, olive, soybean, and sunflower oils. Avocado. Seasoning and other foods Herbs. Spices. Seasoning mixes without salt. Unsalted popcorn and pretzels. Fat-free sweets. What foods are not recommended? The items listed may not be a complete list. Talk with your dietitian about what dietary choices are best for you. Grains Baked goods made with fat, such as croissants, muffins, or some breads. Dry pasta or rice meal packs. Vegetables Creamed or fried vegetables. Vegetables in a cheese sauce. Regular canned vegetables (not low-sodium or reduced-sodium). Regular canned tomato sauce and paste (not low-sodium or reduced-sodium). Regular tomato and vegetable juice (not low-sodium or reduced-sodium). Pickles. Olives. Fruits Canned fruit in a light or heavy syrup. Fried fruit. Fruit in cream or butter sauce. Meat and other protein foods Fatty cuts of meat. Ribs. Fried meat. Bacon. Sausage. Bologna and other processed lunch meats. Salami. Fatback. Hotdogs. Bratwurst. Salted nuts and seeds. Canned beans with added salt. Canned or smoked fish. Whole eggs or egg yolks. Chicken or turkey with skin. Dairy Whole or 2% milk, cream, and half-and-half. Whole or full-fat cream cheese. Whole-fat or sweetened yogurt. Full-fat cheese. Nondairy creamers. Whipped toppings.  Processed cheese and cheese spreads. Fats and oils Butter. Stick margarine. Lard. Shortening. Ghee. Bacon fat. Tropical oils, such as coconut, palm kernel, or palm oil. Seasoning and other foods Salted popcorn and pretzels. Onion salt, garlic salt, seasoned salt, table salt, and sea salt. Worcestershire sauce. Tartar sauce. Barbecue sauce. Teriyaki sauce. Soy sauce, including reduced-sodium. Steak sauce. Canned and packaged gravies. Fish sauce. Oyster sauce. Cocktail sauce. Horseradish that you find on the shelf. Ketchup. Mustard. Meat flavorings and tenderizers. Bouillon cubes. Hot sauce and Tabasco sauce. Premade or packaged marinades. Premade or packaged taco seasonings. Relishes. Regular salad dressings. Where to find more information:  National Heart, Lung, and Blood Institute: www.nhlbi.nih.gov  American Heart Association: www.heart.org Summary  The DASH eating plan is a healthy eating plan that has been shown to reduce high blood pressure (hypertension). It may also reduce your risk for type 2 diabetes, heart disease, and stroke.  With the DASH eating plan, you should limit salt (sodium) intake to 2,300 mg a day. If you have hypertension, you may need to reduce your sodium intake to 1,500 mg a day.  When on the DASH eating plan, aim to eat more fresh fruits and vegetables, whole grains, lean proteins, low-fat dairy, and heart-healthy fats.  Work with your health care provider or diet and nutrition specialist (dietitian) to adjust your eating plan to your   individual calorie needs. This information is not intended to replace advice given to you by your health care provider. Make sure you discuss any questions you have with your health care provider. Document Revised: 08/22/2017 Document Reviewed: 09/02/2016 Elsevier Patient Education  2020 [place] After Stroke A stroke causes damage to the brain cells, which can affect your ability to walk, talk, and even eat. The  impact of a stroke is different for everyone, and so is recovery. A good nutrition plan is important for your recovery. It can also lower your risk of another stroke. If you have difficulty chewing and swallowing your food, a dietitian or your stroke care team can help so that you can enjoy eating healthy foods. What are tips for following this plan?  Reading food labels  Choose foods that have less than 300 milligrams (mg) of sodium per serving. Limit your sodium intake to less than 1,500 mg per day.  Avoid foods that have saturated fat and trans fat.  Choose foods that are low in cholesterol. Limit the amount of cholesterol you eat each day to less than 200 mg.  Choose foods that are high in fiber. Eat 20-30 grams (g) of fiber each day.  Avoid foods with added sugar. Check the food label for ingredients such as sugar, corn syrup, honey, fructose, molasses, and cane juice. Shopping  At the grocery store, buy most of your food from areas near the walls of the store. This includes: ? Fresh fruits and vegetables. ? Dry grains, beans, nuts, and seeds. ? Fresh seafood, poultry, lean meats, and eggs. ? Low-fat dairy products.  Buy whole ingredients instead of prepackaged foods.  Buy fresh, in-season fruits and vegetables from local farmers markets.  Buy frozen fruits and vegetables in resealable bags. Cooking  Prepare foods with very little salt. Use herbs or salt-free spices instead.  Cook with heart-healthy oils, such as olive, avocado, canola, soybean, or sunflower oil.  Avoid frying foods. Bake, grill, or broil foods instead.  Remove visible fat and skin from meat and poultry before eating.  Modify food textures as told by your health care provider. Meal planning  Eat a wide variety of colorful fruits and vegetables. Make sure one-half of your plate is filled with fruits and vegetables at each meal.  Eat fruits and vegetables that are high in potassium, such as: ? Apples,  bananas, oranges, and melon. ? Sweet potatoes, spinach, zucchini, and tomatoes.  Eat fish that contain heart-healthy fats (omega-3 fats) at least twice a week. These include salmon, tuna, mackerel, and sardines.  Eat plant foods that are high in omega-3 fats, such as flaxseeds and walnuts. Add these to cereals, yogurt, or pasta dishes.  Eat several servings of high-fiber foods each day, such as fruits, vegetables, whole grains, and beans.  Do not put salt at the table for meals.  When eating out at restaurants: ? Ask the server about low-salt or salt-free food options. ? Avoid fried foods. Look for menu items that are grilled, steamed, broiled, or roasted. ? Ask if your food can be prepared without butter. ? Ask for condiments, such as salad dressings, gravy, or sauces to be served on the side.  If you have difficulty swallowing: ? Choose foods that are softer and easier to chew and swallow. ? Cut foods into small pieces and chew well before swallowing. ? Thicken liquids as told by your health care provider or dietitian. ? Let your health care provider know if your condition  does not improve over time. You may need to work with a speech therapist to re-train the muscles that are used for eating. General recommendations  Involve your family and friends in your recovery, if possible. It may be helpful to have a slower meal time and to plan meals that include foods everyone in the family can eat.  Brush your teeth with fluoride toothpaste twice a day, and floss once a day. Keeping a clean mouth can help you swallow and can also help your appetite.  Drink enough water each day to keep your urine pale yellow. If needed, set reminders or ask your family to help you remember to drink water.  Limit alcohol intake to no more than 1 drink a day for nonpregnant women and 2 drinks a day for men. One drink equals 12 oz of beer, 5 oz of wine, or 1 oz of hard liquor. Summary  Following this eating  plan can help in your stroke recovery and can decrease your risk for another stroke.  Let your health care provider know if you have problems with swallowing. You may need to work with a speech therapist. This information is not intended to replace advice given to you by your health care provider. Make sure you discuss any questions you have with your health care provider. Document Revised: 12/31/2018 Document Reviewed: 11/17/2017 Elsevier Patient Education  [place] After a Stroke Caregivers provide essential physical and emotional support to people who have had a stroke. If you are supporting someone who has had a stroke, you play an important role in coordinating schedules, helping your loved one to communicate, and helping with the overall rehabilitation plan. What do I need to know about my loved one's recovery? Recovering from a stroke can take weeks, months, or years. Some people may be able to return to a normal lifestyle, and other people may have permanent problems with movement (mobility), thinking, behavior, or communication. Understanding your loved one's condition can help you manage the role of caregiver. Your loved one's health care team may rely on you to provide information such as medical history and current medicines. How can I support my friend or family member? Planning for discharge from the hospital Ask to meet with a social worker or a stroke care coordinator, if one is available. This person can help you to plan for discharge. Before you leave the hospital, make sure you understand:  The recovery process after a stroke.  Physical, emotional, behavioral, and other changes that may affect your loved one after a stroke.  The treatments for stroke, including the medicines that your loved one has to take.  How to lower the risk of another stroke.  Diet and exercise changes for your loved one.  Whether you will need extra help at  home.  Whether your loved one will need help using the bathroom, bathing, eating, or doing other activities.  Changes you have to make at home to make it safe for your loved one.  Whether you need to get special devices or equipment for your loved one. General help  Help your loved one establish a daily routine. This may include setting reminders or having a shared day planner or calendar.  Encourage rest. Your loved one may need frequent breaks during social situations or other activities.  Be patient. Your loved one may take longer to complete tasks and to process information.  When giving instructions, give only one instruction at a time or  give step-by-step lists. Multitasking can be difficult after a stroke.  Offer assistance with household chores or other daily tasks. Make "freezer meals" that can be reheated.  Do not set expectations about your loved one's recovery. Medical visits  Gently remind your loved one of tasks and medical visits if he or she is forgetful.  Provide transportation to and from appointments.  Attend rehabilitation appointments with your loved one. By being involved in the rehabilitation plan, you can encourage your loved one and help with exercises and therapy activities at home. Preventing falls   Follow instructions to prevent falls in your loved one's home. These may include: ? Installing grab bars in bathrooms and handrails in stairways. ? Using night-lights in the bedroom, bathroom, or hallways. ? Removing rugs and mats or making them stick to the floor. ? Keeping walkways clear by removing cords and clutter from the floor. Managing finances  Help to manage your loved one's finances. Talk with a Education officer, museum or legal professional if: ? Your loved one is unable to return to work and is in need of financial help. ? You need help paying medical bills. ? You need to establish guardianship over finances. ? You need help with estate planning. How  should I care for myself? Helping a loved one recover from a stroke can be rewarding, and it can also be challenging and stressful at times. Make sure to care for your own well-being during this time. Lifestyle  Rest. Try to get 7-9 hours of uninterrupted sleep each night.  Eat a balanced diet that includes fresh fruits and vegetables, whole grains, lean proteins, and low-fat dairy.  Exercise for 30 or more minutes on 5 or more days each week.  Find ways to manage stress. These may include: ? Deep breathing, yoga, or meditation. ? Spending time outdoors. ? Journaling. Finding support   Ask for help. Take a break if you are the primary caregiver to your loved one.  Spend time with supportive people.  Join a support group with other caregivers or family members of people who have had a stroke.  If you experience new or worsening depression or anxiety, seek counseling from a mental health professional. Where to find more information You may find more information about supporting someone who has had a stroke from:  National Stroke Association: FetchFilms.dk  American Stroke Association: www.strokeassociation.org/STROKEORG Summary  Caregivers provide essential physical and emotional support to people who have had a stroke.  Before you leave the hospital, make sure you understand how to care for someone who has had a stroke.  It is normal to have many different emotions while caring for someone who has had a stroke. Make sure to care for your own well-being during this time. This information is not intended to replace advice given to you by your health care provider. Make sure you discuss any questions you have with your health care provider. Document Revised: 12/31/2018 Document Reviewed: 12/27/2016 Elsevier Patient Education  [place].  Moyamoya Moyamoya is a rare disease that affects blood vessels in the brain. In this condition,  major blood vessels that carry oxygen to the brain (internal carotid arteries) get narrower over time. Eventually, the brain does not get enough oxygen. To make up for the loss of oxygen, new blood vessels form near the base of the brain. These blood vessels are tiny and tangled. On a brain imaging test, they look like puffs of smoke. Moyamoya means "puffs of smoke" in [place]. Moyamoya affects  both children and adults. In some cases, it occurs with other diseases (moyamoya syndrome). These diseases include:  Sickle cell disease.  Neurofibromatosis.  Down syndrome.  Graves' disease. Moyamoya gets worse over time. Lack of oxygen to the brain can cause strokes and seizures. The tangled new blood vessels can break and cause bleeding into the brain. This can also lead to a stroke. What are the causes? The exact cause of this condition is not known. Possible causes include a genetic defect, head trauma, inflammation in the arteries, or brain infection. What increases the risk? The following factors may make a person more likely to develop this condition:  Being 27-7 years old or 91-82 years old.  Being Asian. This condition is most common in people with [place] ethnicity.  Having a family history of moyamoya.  Being female. What are the signs or symptoms? Symptoms of moyamoya are caused by not enough oxygen getting to the brain. This may cause temporary symptoms of a stroke, called a transient ischemic attack (TIA). Symptoms of decreased oxygen or TIA include:  Headaches.  Temporary loss of speech.  Sudden weakness or numbness on one side of the body.  Jerky uncontrolled movements (chorea).  Seizures.  Vision problems.  Decreased mental abilities. You may have a stroke if blood flow is completely blocked to part of the brain or if a blood vessel ruptures. Symptoms of a stroke include:  Sudden weakness or numbness.  Loss of movement on one side of the body. This may involve  the face, arm, or leg on the affected side. How is this diagnosed? This condition may be diagnosed based on:  Your symptoms and medical history.  A physical exam.  Blood tests.  Genetic testing. This may be done if you have a family history of the condition.  Brain imaging tests. These will show blockage of the internal carotid arteries and the typical "puffs of smoke" imaging caused by the new blood vessel formations. Tests may include: ? Digital subtraction angiogram. In this test, a thin tube (catheter) is inserted into an artery in your thigh (femoral artery) and moved into the arteries in your neck and brain (carotid arteries and vertebral arteries). Dye is released from the catheter into those arteries, and X-rays are taken to follow the path of the blood flow. ? CT angiogram. In this test, you are given an injection of dye before having a CT scan of the head. ? Magnetic resonance angiogram. In this test, you may get an injection of dye before having an MRI of your brain. In some cases, other tests may be needed. How is this treated? Treatment for this condition may include:  Medicines to help control symptoms, such as: ? Aspirin to prevent blood clots and TIAs. ? Headache medicines. ? Anti-seizure medicines.  Surgery on arteries to restore proper blood flow to the brain (revascularization procedures). Surgery is the only treatment that may restore blood flow to the brain. Follow these instructions at home:     If you have surgery, follow instructions from your health care provider about home care after the procedure. In general, make sure you:  Take over-the-counter and prescription medicines only as told by your health care provider.  Avoid using supplements that contain stimulants. Talk with your health care provider before taking any supplements.  Drink plenty of fluids to keep from becoming dehydrated. Drink enough fluid to keep your urine pale yellow.  Return to your  normal activities as told by your health care provider.  Ask your health care provider what activities are safe for you. You may need to avoid: ? Activities that place you at risk for a head injury, such as contact sports. ? Activities that involve a change in pressure, such as scuba diving.  Do not use any products that contain nicotine or tobacco, such as cigarettes, e-cigarettes, and chewing tobacco. Nicotine narrows blood vessels and decreases blood flow. If you need help quitting, ask your health care provider.  Keep all follow-up visits as told by your health care provider. This is important. Contact a health care provider if you:  Have symptoms of decreased oxygen or TIA. Get help right away if you have:  A severe headache.  Any symptoms of a stroke. "BE FAST" is an easy way to remember the main warning signs of a stroke: ? B - Balance. Signs are dizziness, sudden trouble walking, or loss of balance. ? E - Eyes. Signs are trouble seeing or a sudden change in vision. ? F - Face. Signs are sudden weakness or numbness of the face, or the face or eyelid drooping on one side. ? A - Arms. Signs are weakness or numbness in an arm. This happens suddenly and usually on one side of the body. ? S - Speech. Signs are sudden trouble speaking, slurred speech, or trouble understanding what people say. ? T - Time. Time to call emergency services. Write down what time symptoms started.  Other signs of a stroke, such as: ? A sudden, severe headache with no known cause. ? Nausea or vomiting. ? Seizure. These symptoms may represent a serious problem that is an emergency. Do not wait to see if the symptoms will go away. Get medical help right away. Call your local emergency services (911 in the [place] Do not drive yourself to the hospital. Summary  Moyamoya is a rare disease that affects blood vessels in the brain. Its cause is not known.  This condition develops when major blood vessels that carry  oxygen to the brain get narrower over time. Eventually, the brain does not get enough oxygen.  Symptoms of decreased oxygen and blood supply to the brain can include temporary symptoms of a stroke, or TIA (transient ischemic attack).  You can take medicines to manage the symptoms of moyamoya, but surgery is the only way to restore blood flow to the brain. This information is not intended to replace advice given to you by your health care provider. Make sure you discuss any questions you have with your health care provider. Document Revised: 04/15/2018 Document Reviewed: 04/15/2018 Elsevier Patient Education  [place].

## 2020-02-08 NOTE — Progress Notes (Signed)
STROKE TEAM PROGRESS NOTE   INTERVAL HISTORY Daughter at bedside. Pt sitting in bed, no complains, no acute event overnight. Had cerebral angiogram with Dr. Estanislado Pandy found to have left tICA occlusion and right MCA M1 50-60% stenosis, with prominent collateral flow, consistent with moyamoya disease.   OBJECTIVE Vitals:   02/08/20 1250 02/08/20 1305 02/08/20 1320 02/08/20 1350  BP: 129/85 134/85 (!) 128/91 140/88  Pulse: 78 74 73 80  Resp: 16 16 15 16   Temp:      TempSrc:      SpO2: 100% 100% 100% 100%  Weight:      Height:       CBC:  Recent Labs  Lab 02/05/20 1228 02/05/20 1228 02/05/20 1259 02/08/20 0316  WBC 10.0  --   --  4.9  NEUTROABS 7.0  --   --   --   HGB 14.5   < > 14.6 12.8  HCT 42.9   < > 43.0 38.0  MCV 91.3  --   --  90.9  PLT 301  --   --  222   < > = values in this interval not displayed.   Basic Metabolic Panel:  Recent Labs  Lab 02/05/20 1228 02/05/20 1228 02/05/20 1259 02/08/20 0316  NA 133*   < > 136 139  K 4.4   < > 4.4 4.3  CL 97*   < > 99 107  CO2 27  --   --  25  GLUCOSE 89   < > 83 103*  BUN 18   < > 21* 11  CREATININE 1.12*   < > 1.10* 0.95  CALCIUM 8.5*  --   --  8.3*  MG  --   --   --  1.9  PHOS  --   --   --  2.5   < > = values in this interval not displayed.   Lipid Panel:     Component Value Date/Time   CHOL 203 (H) 02/06/2020 0434   TRIG 270 (H) 02/06/2020 0434   HDL 42 02/06/2020 0434   CHOLHDL 4.8 02/06/2020 0434   VLDL 54 (H) 02/06/2020 0434   LDLCALC 107 (H) 02/06/2020 0434   HgbA1c:  Lab Results  Component Value Date   HGBA1C 5.5 02/06/2020   Urine Drug Screen: No results found for: LABOPIA, COCAINSCRNUR, LABBENZ, AMPHETMU, THCU, LABBARB  Alcohol Level No results found for: [place] 02/05/2020   ADDENDUM  The findings and impression should read left frontal lobe not right.  IMPRESSION:  Area of decreased attenuation in the right frontal lobe consistent with subacute ischemia. No  other focal abnormality is noted.   MR BRAIN WO CONTRAST MR ANGIO HEAD WO CONTRAST 02/05/2020 IMPRESSION:  1. 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  2. Subacute left frontal infarct.  3. Moderately extensive chronic small vessel ischemic disease.  4. 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   CT ANGIO HEAD W OR WO CONTRAST CT ANGIO NECK W OR WO CONTRAST 02/06/2020   ADDENDUM:  The impression should also include note of abnormal and somewhat unusual appearing bilateral level 2 cervical lymph nodes. The nodes are slightly enlarged, enhance more than normal and show some internal heterogeneity. The nature of this is unclear. There is no older study for comparison. The single largest level 2 node on the  left measures 20 x 12 x 10 mm, only mildly enlarged. These may simply be reactive to some systemic or head and neck inflammatory process.  IMPRESSION:  No sign of atherosclerotic vascular disease. Left supraclinoid ICA occlusion. Reconstituted/collateral supply visible in the anterior and middle cerebral territories, with some deficiency in the left frontal region. Distal right ICA is abnormally small but does remain patent, supplying the right middle cerebral artery territory. Occlusion or severe stenosis of the right ACA. Reconstituted/collateral distal flow. The differential diagnosis in this case is that of moyamoya disease versus is some other sort of vasculitis. 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   CEREBRAL ANGIOGRAM 02/08/2020 1.Occluded Lt ICA terminus. 2.Approx 50 to 60 % stenosis of RT MCA  M 1 seg. 3.Prominent PCAs.  ECHOCARDIOGRAM COMPLETE 02/06/2020 IMPRESSIONS   1. Left ventricular ejection fraction, by estimation, is 60 to 65%. The left ventricle has normal function. The left ventricle has no regional wall motion abnormalities.  There is mild asymmetric left ventricular hypertrophy of the basal and septal segments. Left ventricular diastolic parameters are consistent with Grade I diastolic dysfunction (impaired relaxation).   2. Right ventricular systolic function is normal. The right ventricular size is normal. There is normal pulmonary artery systolic pressure.   3. The mitral valve is normal in structure. Trivial mitral valve regurgitation. No evidence of mitral stenosis.   4. The aortic valve is tricuspid. Aortic valve regurgitation is not visualized. Mild aortic valve sclerosis is present, with no evidence of aortic valve stenosis.   5. The inferior vena cava is normal in size with greater than 50% respiratory variability, suggesting right atrial pressure of 3 mmHg.   DG Hip Unilat W or Wo Pelvis 2-3 Views Right 02/05/2020 IMPRESSION:  Degenerative change without acute abnormality.   ECG - SR rate 71 BPM. (See cardiology reading for complete details)   PHYSICAL EXAM   Temp:  [98.2 F (36.8 C)-99.1 F (37.3 C)] 98.2 F (36.8 C) (05/18 1238) Pulse Rate:  [73-120] 80 (05/18 1350) Resp:  [11-19] 16 (05/18 1350) BP: (103-159)/(67-107) 140/88 (05/18 1350) SpO2:  [96 %-100 %] 100 % (05/18 1350)  General - Well nourished, well developed, in no apparent distress.  Ophthalmologic - fundi not visualized due to noncooperation.  Cardiovascular - Regular rhythm and rate.  Mental Status -  Level of arousal and orientation to time, place, and person were intact. Language including expression, naming, repetition, comprehension was assessed and found intact.  Cranial Nerves II - XII - II - Visual field intact OU. III, IV, VI - Extraocular movements intact. V - Facial sensation intact bilaterally. VII - Facial movement intact bilaterally. VIII - Hearing & vestibular intact bilaterally. X - Palate elevates symmetrically. XI - Chin turning & shoulder shrug intact bilaterally. XII - Tongue protrusion  intact.  Motor Strength - The patient's strength was normal in all extremities and pronator drift was absent.  Bulk was normal and fasciculations were absent.   Motor Tone - Muscle tone was assessed at the neck and appendages and was normal.  Reflexes - The patient's reflexes were symmetrical in all extremities and she had no pathological reflexes.  Sensory - Light touch, temperature/pinprick were assessed and were symmetrical.    Coordination - The patient had normal movements in the hands with no ataxia or dysmetria.  Tremor was absent.  Gait and Station - deferred.   ASSESSMENT/PLAN Ms. Ommie Casali is a 49 y.o. female with history of hypertension presenting with left arm  and facial numbness / weakness. 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 Less likely vasculitis given large vessel involvement  CT head - Area of decreased attenuation in the left frontal lobe consistent with subacute ischemia.  MRI head - Multiple small acute infarcts in the right ACA territory and left precentral gyrus. Subacute left frontal infarct.   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   CTA H&N - No sign of atherosclerotic vascular disease. Left supraclinoid ICA occlusion. Reconstituted/collateral supply visible in the anterior and middle cerebral territories, with some deficiency in the left frontal region. Distal right ICA is abnormally small but does remain patent, supplying the right middle cerebral artery territory. Occlusion or severe stenosis of the right ACA.   2D Echo - EF 60 - 65%. No cardiac source of emboli identified.   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 Estanislado Pandy will review pt sister's imaging to see if there is any familial component  On DAPT  DDx including CNS vasculitis - however the large vessel involvement make it less likely - autoimmune labs neg   Hypertension  Home BP meds: Zestril ; HCTZ ; metoprolol  Current BP meds: none   Stable . Permissive hypertension (OK if < 220/120) but gradually normalize in 5-7 days  . Long-term BP goal 130-150  Hyperlipidemia  Home Lipid lowering medication: none  LDL 107, goal < 70  Current lipid lowering medication: Lipitor 40 mg daily   Continue statin at discharge  Other Stroke Risk Factors  Previous ETOH use.  Other Active Problems  Code status - Full code  Abnormal and somewhat unusual appearing bilateral level 2 cervical lymph nodes. The nodes are slightly enlarged, enhance more than normal and show some internal heterogeneity. The nature of this is unclear. There is no older study for comparison. The single largest level 2 node on the left measures 20 x 12 x 10 mm, only mildly enlarged. These may simply be reactive to some systemic or head and neck inflammatory process by CTA.   Hospital day # 3  Neurology will sign off. Please call with questions. Pt will follow up with stroke clinic Dr. Leonie Man at [facility] in about 4 weeks.  Thanks for the consult.   Rosalin Hawking, MD PhD Stroke Neurology 02/08/2020 2:17 PM    To contact Stroke Continuity provider, please refer to [email or web address] After hours, contact General Neurology

## 2020-02-08 NOTE — Procedures (Signed)
S/P 4 vessel cerebral arteriogram. RT rad approach. Findings. 1.Occluded Lt ICA terminus. 2.Approx 50 to 60 % stenosis of RT MCA  M 1 seg. 3.Prominent PCAs. S.Issabelle Mcraney MD

## 2020-02-10 LAB — ALPHA GALACTOSIDASE: Alpha galactosidase, serum: 25.7 nmol/hr/mg prt — ABNORMAL LOW (ref >35.5)

## 2020-02-11 ENCOUNTER — Other Ambulatory Visit: Payer: Self-pay

## 2020-02-11 ENCOUNTER — Encounter (HOSPITAL_COMMUNITY): Payer: Self-pay

## 2020-02-14 ENCOUNTER — Encounter: Payer: Self-pay | Admitting: Family Medicine

## 2020-02-14 ENCOUNTER — Ambulatory Visit (INDEPENDENT_AMBULATORY_CARE_PROVIDER_SITE_OTHER): Payer: BC Managed Care – PPO | Admitting: Family Medicine

## 2020-02-14 VITALS — BP 130/84 | HR 67 | Temp 97.8°F | Resp 12 | Ht 66.0 in | Wt 154.2 lb

## 2020-02-14 DIAGNOSIS — E785 Hyperlipidemia, unspecified: Secondary | ICD-10-CM | POA: Diagnosis not present

## 2020-02-14 DIAGNOSIS — I10 Essential (primary) hypertension: Secondary | ICD-10-CM | POA: Diagnosis not present

## 2020-02-14 DIAGNOSIS — I63232 Cerebral infarction due to unspecified occlusion or stenosis of left carotid arteries: Secondary | ICD-10-CM

## 2020-02-14 MED ORDER — LISINOPRIL 10 MG PO TABS: 5.0000 mg | ORAL_TABLET | Freq: Every day | ORAL | 0 refills | Status: DC

## 2020-02-14 NOTE — Patient Instructions (Signed)
A few things to remember from today's visit:  Continue Metoprolol 50 mg 2 times daily. Lisinopril 5 mg resumed today. Lab in 7-10 days. Continue monitoring blood pressures.  Left me know about blood pressure in 3-4 weeks.   If you need refills please call your pharmacy. Do not use My Chart to request refills or for acute issues that need immediate attention.    Please be sure medication list is accurate. If a new problem present, please set up appointment sooner than planned today.

## 2020-02-14 NOTE — Progress Notes (Signed)
Chief Complaint  Patient presents with  . Establish Care   HPI: Jacqueline Medina is a 49 y.o. female, who is here today to establish care.  Former PCP: Dr Ginette Otto Last preventive routine visit: 06/26/15  Chronic medical problems: Anxiety disorder, CVA, hypertension, hyponatremia,and HLD among some. Recently hospitalized for CVA. She was admitted on 02/05/20 and discharged on 02/08/20. She presented to the ER c/o 2 days of right hand weakness,dropping objects and not able to write. Having difficulty getting the words out.  Admitted for stroke work-out.  Lab Results  Component Value Date   CREATININE 0.95 02/08/2020   BUN 11 02/08/2020   NA 139 02/08/2020   K 4.3 02/08/2020   CL 107 02/08/2020   CO2 25 02/08/2020   Angiographically occluded left internal carotid artery at the level of the ophthalmic artery. Retrograde collateralization of the anterior cerebral artery distributions bilaterally via prominent P3 branches of the posterior cerebral arteries, and the thalamo perforators bilaterally.  Retrograde opacification of the left MCA distribution from the posterior temporal artery branches from the P2 segment of the prominent left posterior cerebral artery. Approximately 50% stenosis of the distal right ICA supraclinoid segment. Approximately 60% stenosis of the proximal M1 segment of the right middle cerebral artery.  She was discharged on Aspirin 325 mg daily and Plavix 75 mg daily. She is supposed to complete 3 months of Plavix and continue with Aspirin 325 mg.  She has an appt with neurologist, Dr Sethi,02/19/20. PT and OT were arranged.  Concerns today: Elevated BP. BP reading at home still elevated: 164/114,118/77,145/99,and 121/85. She is on Metoprolol tartrate 50 mg bid. She was on Lisinopril at the time she was admitted, discontinued.  She is tolerating medication well.  Negative for severe/frequent headache, visual changes, chest pain, dyspnea,  palpitation, claudication, new focal weakness, or edema.  HLD: She is on Atorvastatin 80 mg daily.  Lab Results  Component Value Date   CHOL 203 (H) 02/06/2020   HDL 42 02/06/2020   LDLCALC 107 (H) 02/06/2020   TRIG 270 (H) 02/06/2020   CHOLHDL 4.8 02/06/2020    Review of Systems  Constitutional: Negative for activity change, appetite change, fatigue and fever.  HENT: Negative for mouth sores, nosebleeds and sore throat.   Respiratory: Negative for cough and wheezing.   Gastrointestinal: Negative for abdominal pain, nausea and vomiting.       Negative for changes in bowel habits.  Genitourinary: Negative for decreased urine volume, dysuria and hematuria.  Musculoskeletal: Negative for gait problem and myalgias.  Neurological: Negative for syncope and facial asymmetry.  Psychiatric/Behavioral: Negative for confusion. The patient is not nervous/anxious.   Rest see pertinent positives and negatives per HPI.  Current Outpatient Medications on File Prior to Visit  Medication Sig Dispense Refill  . aspirin 325 MG tablet Take 1 tablet (325 mg total) by mouth daily. 360 tablet 0  . atorvastatin (LIPITOR) 80 MG tablet Take 1 tablet (80 mg total) by mouth daily. 90 tablet 0  . busPIRone (BUSPAR) 5 MG tablet Take 5 mg by mouth in the morning and at bedtime.    . clopidogrel (PLAVIX) 75 MG tablet Take 1 tablet (75 mg total) by mouth daily. 90 tablet 0  . metoprolol tartrate (LOPRESSOR) 50 MG tablet Take 50 mg by mouth 2 (two) times daily.    . Multiple Vitamins-Minerals (MULTIVITAMIN GUMMIES ADULT PO) Take 2 tablets by mouth daily. 2 gummies daily     No current facility-administered medications on  file prior to visit.   Past Medical History:  Diagnosis Date  . Hypertension    No Known Allergies  Family History  Problem Relation Age of Onset  . Diabetes Father    Social History   Socioeconomic History  . Marital status: Single    Spouse name: Not on file  . Number of children:  Not on file  . Years of education: Not on file  . Highest education level: Not on file  Occupational History  . Not on file  Tobacco Use  . Smoking status: Never Smoker  . Smokeless tobacco: Never Used  Substance and Sexual Activity  . Alcohol use: Not Currently  . Drug use: Never  . Sexual activity: Not on file  Other Topics Concern  . Not on file  Social History Narrative  . Not on file   Social Determinants of Health   Financial Resource Strain:   . Difficulty of Paying Living Expenses:   Food Insecurity:   . Worried About Charity fundraiser in the Last Year:   . Arboriculturist in the Last Year:   Transportation Needs:   . Film/video editor (Medical):   Marland Kitchen Lack of Transportation (Non-Medical):   Physical Activity:   . Days of Exercise per Week:   . Minutes of Exercise per Session:   Stress:   . Feeling of Stress :   Social Connections:   . Frequency of Communication with Friends and Family:   . Frequency of Social Gatherings with Friends and Family:   . Attends Religious Services:   . Active Member of Clubs or Organizations:   . Attends Archivist Meetings:   Marland Kitchen Marital Status:     Vitals:   02/14/20 1455  BP: 130/84  Pulse: 67  Resp: 12  Temp: 97.8 F (36.6 C)  SpO2: 100%    Body mass index is 24.9 kg/m.  Physical Exam  Nursing note and vitals reviewed. Constitutional: She is oriented to person, place, and time. She appears well-developed. No distress.  HENT:  Head: Normocephalic and atraumatic.  Mouth/Throat: Oropharynx is clear and moist and mucous membranes are normal.  Eyes: Pupils are equal, round, and reactive to light. Conjunctivae are normal.  Cardiovascular: Normal rate and regular rhythm.  No murmur heard. Pulses:      Dorsalis pedis pulses are 2+ on the right side and 2+ on the left side.  Respiratory: Effort normal and breath sounds normal. No respiratory distress.  GI: Soft. She exhibits no mass. There is no  hepatomegaly. There is no abdominal tenderness.  Musculoskeletal:        General: No edema.  Lymphadenopathy:    She has no cervical adenopathy.  Neurological: She is alert and oriented to person, place, and time. She has normal strength. No cranial nerve deficit. Gait normal.  Skin: Skin is warm. No rash noted. No erythema.  Psychiatric: She has a normal mood and affect.  Well groomed, good eye contact.   ASSESSMENT AND PLAN:  Ms.Scotti was seen today for establish care.  Diagnoses and all orders for this visit: Orders Placed This Encounter  Procedures  . Basic metabolic panel    Cerebrovascular accident (CVA) due to occlusion of left carotid artery (HCC) Continue Aspirin 325 mg daily and Atorvastatin 80 mg daily. Complete Plavix 75 mg daily. Keep appt with neuro.  Essential hypertension Still BP is not well controlled. She tolerated Lisinopril well in the past. I suspect ACEI was d/c because  AKI. She agrees with resuming low dose Lisinopril, 5 mg. Instructed to continue checking BP. BMP in 7-10 days. Instructed about warning signs. If BP still elevated in 2 weeks Lisinopril can be increased to 10 mg daily.  -     lisinopril (ZESTRIL) 10 MG tablet; Take 0.5 tablets (5 mg total) by mouth daily.  Hyperlipidemia, unspecified hyperlipidemia type Continue Atorvastatin 80 mg daily. LDL goal < 100,ideally < 70. Will check FLP next visit.  Return in about 3 months (around 05/16/2020) for HLD,HTN.   [name]. [name], MD  [facility]. [place] office.  A few things to remember from today's visit:  Continue Metoprolol 50 mg 2 times daily. Lisinopril 5 mg resumed today. Lab in 7-10 days. Continue monitoring blood pressures.  Left me know about blood pressure in 3-4 weeks.   If you need refills please call your pharmacy. Do not use My Chart to request refills or for acute issues that need immediate attention.    Please be sure medication list is accurate.  If a new problem present, please set up appointment sooner than planned today.

## 2020-02-17 ENCOUNTER — Telehealth: Payer: Self-pay | Admitting: Family Medicine

## 2020-02-17 NOTE — Telephone Encounter (Signed)
Spoke to pt and she stated that her bp reading were:   3am 134/96 145/80 127/90 115/78 116/71   12pm 136/97 146/98 149/97 165/110    Pt takes metoprolol in the am and evening and lisinopril in evening.

## 2020-02-17 NOTE — Telephone Encounter (Signed)
Pt wants to do a VV due to not being able to drive. Pt is under the care of OT who advised that she should drive. [place] for VV? Pt has a home monitor

## 2020-02-17 NOTE — Telephone Encounter (Signed)
Pt having problem with her bp running high she said when she take the metoprolol tartrate (LOPRESSOR) 50 MG tablet Her BP is high

## 2020-02-18 NOTE — Telephone Encounter (Signed)
Patient notified of update  and verbalized understanding. 

## 2020-02-18 NOTE — Telephone Encounter (Signed)
Patient's provider out of the office.  Continue current medications as adjustments just made at recent office visit..  Patient advised to decrease sodium intake and increase water intake.  Per providers note follow-up with PCP in the next 1 to 2 weeks for consistently elevated BP.

## 2020-02-23 ENCOUNTER — Encounter: Payer: Self-pay | Admitting: *Deleted

## 2020-02-23 ENCOUNTER — Other Ambulatory Visit: Payer: Self-pay | Admitting: *Deleted

## 2020-02-23 NOTE — Patient Outreach (Addendum)
[facility]) Care Management  02/23/2020  Jacqueline Medina 03/29/1971 [id number]   EMMI- stroke NOT on APL  RED ON EMMI ALERT (5/27/210 Day # 6 Date: 02/16/20 1000 Red Alert Reason: Questions/problems with meds? Yes Issued EMMI program of smoking cessation- thinking about quitting smoking and stroke preventing secondary stroke materials And  RED ON EMMI ALERT(02/22/20) Day # 9 Date Saturday 02/19/20 1000 Red Alert Reason: Sad, hopeless, anxious, or empty? yes   Insurance:  blue cross and blue shield Comm PPO  [facility] admissions x 1 ED visits x 1 in the last 6 months  Last admission of 02/05/20 to 02/08/20- presented after  Weakness of right hand x 2 days, dropping thins unable to write, unable to express herself    Outreach attempt #1 successful to home/mobile number  Patient is able to verify HIPAA, DOB and address [facility] RN reviewed and addressed red alert with patient   EMMI:  She reports the EMMI answers are both correct Red Alert Reason: Questions/problems with meds? Yes She reports having an elevated blood pressure (BP) and questions about medicines to decrease the BP She reports being seen at her primary care provider (PCP) office and being placed back on additional BP medication Lisinopril 5 mg and informed to continue Metoprolol 50 mg bid. Lisinopril had been discontinued upon hospital discharge. She reports Dr [name] is to check her kidney functioning on the next visit  Discharge medications reviewed She reports having and elevated BP prior to her stroke  She monitors her BP at home She does not exercise  [facility] RN CM discussed the importance of diet, exercise monitoring BP and taking medicines as ordered  She denies pain  Red Alert Reason: Sad, hopeless, anxious, or empty? yes   She admits to having anxiety and is taking medications (Buspar) for anxiety [facility] RN CM discussed [facility] SW services and resources availability Assessed PHQ-2 =1 PHQ-9  =5  Support offered but prefers to continue to work with her medical providers  Jacqueline Medina reports she is presently not working and is interested in her pcp allowing her to return to work on next week.   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 She lives with and has support of her daughters (57 & 63). She is independent with care needs and denies issues with transportation to medical appointments  Right handed   Conditions: cerebrovascular accident (CVA) with residual slow processing/memory- weakness of right hand- CT scan of the brain significant for left frontal lobe area of decreased attenuation concerning for subacute ischemia. MRI brain multiple small acute infarcts rt ACA territory and lt precentral gyrus; subacute lt frontal infarct; occlusion distal lt ICA, lt MCA and bil ACAs, severe stenosis rt ICA (Moyamoya pattern); moderately extensive chronic small vessel disease. Permissive HTN up to 220/110), essential Hypertension (HTN), hyponatremia, anxiety, Hyperlipidemia (HLD), anemia, cortical cataract, perimenopausal, pap smear abnormality of cervix with ASCUS favoring benign  Fall on steps in rain 2 weeks ago from 02/05/20 admission in flip flops - right hip pain xray negative   DME: BP cuff  Medications: She denies concerns with taking medications as prescribed, affording medications, side effects of medications and questions about medications    Appointments: June 03/2020 Dr Betty [name], last seen on 02/14/20  March 21 2020 hospital follow up with Dr Leonie Man, Neurology   Advance Directives: Denies need for assist with advance directives    Consent: [facility] RN CM reviewed [facility] services with patient. Patient gave verbal consent for services [facility] telephonic RN CM.   Advised patient that there  will be further automated EMMI- post discharge calls to assess how the patient is doing following the recent hospitalization Advised the patient that another call may be received from a nurse if any of their responses were abnormal. Patient voiced understanding and was appreciative of f/u call.   Plan: [facility] RN CM will follow up with Jacqueline Medina within the next 7-10 business days  Routed note to MD Pt encouraged to return a call to [place] CM prn  [facility] RN CM sent a successful outreach letter as discussed with [facility] brochure enclosed for review  Jacqueline Medina L. Lavina Hamman, RN, BSN, [place] Coordinator Office number [phone] Mobile number [phone]  Main [facility] number [phone] Fax number [phone]

## 2020-02-24 ENCOUNTER — Telehealth: Payer: Self-pay | Admitting: Family Medicine

## 2020-02-24 NOTE — Telephone Encounter (Signed)
Pt is calling in stating that she was expecting a call back to let her know what labs and to schedule to have labs done on 02/28/2020.  Pt is aware that Dr. [name] has not been in the office all week.  Pt has been scheduled for labs that had been ordered in the system for 02/28/2020.

## 2020-02-25 ENCOUNTER — Other Ambulatory Visit: Payer: Self-pay | Admitting: *Deleted

## 2020-02-25 ENCOUNTER — Telehealth: Payer: Self-pay | Admitting: Family Medicine

## 2020-02-25 ENCOUNTER — Other Ambulatory Visit: Payer: Self-pay

## 2020-02-25 DIAGNOSIS — I10 Essential (primary) hypertension: Secondary | ICD-10-CM

## 2020-02-25 MED ORDER — METOPROLOL TARTRATE 50 MG PO TABS: 50.0000 mg | ORAL_TABLET | Freq: Two times a day (BID) | ORAL | 1 refills | Status: DC

## 2020-02-25 NOTE — Telephone Encounter (Signed)
Clinic RN refilled medication and sent to preferred pharmacy. Patient notified and verbalized understanding.

## 2020-02-25 NOTE — Telephone Encounter (Signed)
Nurse Triage stated the pt is out of this medication and her bp has been high for the past couple of days 174/84 and today it was 128/96. Nurse Triage said the pt was informed her medication would be sent in today but I informed them that PCP is out on vacation. Triage would like if possible for her medication to be sent in to she does not go the weekend with high bp.   Informed her that I will send the message to Nurse Manager and see if we can call it in.

## 2020-02-25 NOTE — Telephone Encounter (Signed)
[facility] [place] Phone:  [phone]  Fax:  [phone]     metoprolol tartrate (LOPRESSOR) 50 MG tablet

## 2020-02-28 ENCOUNTER — Other Ambulatory Visit: Payer: Self-pay | Admitting: *Deleted

## 2020-02-28 ENCOUNTER — Encounter: Payer: Self-pay | Admitting: *Deleted

## 2020-02-28 ENCOUNTER — Other Ambulatory Visit (INDEPENDENT_AMBULATORY_CARE_PROVIDER_SITE_OTHER): Payer: BC Managed Care – PPO

## 2020-02-28 ENCOUNTER — Other Ambulatory Visit: Payer: Self-pay

## 2020-02-28 DIAGNOSIS — I10 Essential (primary) hypertension: Secondary | ICD-10-CM | POA: Diagnosis not present

## 2020-02-28 LAB — BASIC METABOLIC PANEL
BUN: 15 mg/dL (ref 6–23)
CO2: 30 mEq/L (ref 19–32)
Calcium: 9.4 mg/dL (ref 8.4–10.5)
Chloride: 107 mEq/L (ref 96–112)
Creatinine, Ser: 0.89 mg/dL (ref 0.40–1.20)
GFR: 80.93 mL/min (ref >60.00)
Glucose, Bld: 90 mg/dL (ref 70–99)
Potassium: 4.2 mEq/L (ref 3.5–5.1)
Sodium: 143 mEq/L (ref 135–145)

## 2020-02-28 NOTE — Patient Outreach (Signed)
[facility]) Care Management  02/28/2020  Jacqueline Medina [birth year] [id number]   Follow up outreach to [facility]- stroke referred patient NOT on APL  RED ON EMMI ALERT (5/27/210 Day # 6 Date: 02/16/20 1000 Red Alert Reason: Questions/problems with meds? Yes Issued EMMI program of smoking cessation- thinking about quitting smoking and stroke preventing secondary stroke materials And  RED ON EMMI ALERT(02/22/20) Day # 9 Date Saturday 02/19/20 1000 Red Alert Reason: Sad, hopeless, anxious, or empty? yes   Insurance:  blue cross and blue shield Comm PPO  [facility] admissions x 1 ED visits x 1 in the last 6 months  Last admission of 02/05/20 to 02/08/20- presented after  Weakness of right hand x 2 days, dropping things unable to write, unable to express herself    Outreach attempt #1 successful to home/mobile number  Patient is able to verify HIPAA, DOB and address [place] Management RN reviewed and addressed red al  EMMI follow up  She went in today to get labs completed only  She reports feeling better today related to sad, anxiety symptoms Her speech is clear and it is not noted that she is having issues expressing herself She continues to have support of her daughters  CVA/HTN She still continues to monitor her BP today she was 144/92   Prefers not to get flu shot [facility] RN CM discussed the importance of managing her blood pressure (BP), prevention of stress and dehydration, exercises, step tracker, diet, drinking, and smoking  She confirms with [facility] RN CM she does not smoke   EMMI materials sent on relaxation techniques and on relieving stress (video) to her e-mail address  She and [facility] RN CM discussed the EMMI materials sent to her e-mail address  Questions answered She was encouraged to call [facility] RN CM prn and reminded [facility] RN CM would follow back up with her if further EMMI red alerts occur She voiced understanding  Social: Jacqueline Medina is a 49 year old  patient who works full time at [employer] 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 She lives with and has support of her daughters (47 & 60). She is independent with care needs and denies issues with transportation to medical appointments  Right handed   Conditions: cerebrovascular accident (CVA) with residual slow processing/memory- weakness of right hand- CT scan of the brain significant for left frontal lobe area of decreased attenuation concerning for subacute ischemia. MRI brain multiple small acute infarcts rt ACA territory and lt precentral gyrus; subacute lt frontal infarct; occlusion distal lt ICA, lt MCA and bil ACAs, severe stenosis rt ICA (Moyamoya pattern); moderately extensive chronic small vessel disease. Permissive HTN up to 220/110), essential Hypertension (HTN), hyponatremia, anxiety, Hyperlipidemia (HLD), anemia, cortical cataract, perimenopausal, pap smear abnormality of cervix with ASCUS favoring benign  Fall on steps in rain 2 weeks ago from 02/05/20 admission in flip flops - right hip pain xray negative   DME: BP cuff   Plans As [name] denies any concerns at this time [facility] RN CM will close case at this time and pt encouraged to return a call to [facility] RN CM prn  Routed note to MDs/NP/PA  Joelene Millin L. Lavina Hamman, RN, BSN, [place] Coordinator Office number [phone] Mobile number [phone]  Main [facility] number [phone] Fax number [phone]

## 2020-02-28 NOTE — Telephone Encounter (Signed)
Patient had BMP done on today.

## 2020-02-28 NOTE — Telephone Encounter (Signed)
FYI

## 2020-03-01 ENCOUNTER — Telehealth (INDEPENDENT_AMBULATORY_CARE_PROVIDER_SITE_OTHER): Payer: BC Managed Care – PPO | Admitting: Family Medicine

## 2020-03-01 ENCOUNTER — Encounter: Payer: Self-pay | Admitting: Family Medicine

## 2020-03-01 ENCOUNTER — Telehealth: Payer: Self-pay | Admitting: *Deleted

## 2020-03-01 VITALS — BP 141/98 | HR 90 | Ht 66.0 in

## 2020-03-01 DIAGNOSIS — I10 Essential (primary) hypertension: Secondary | ICD-10-CM

## 2020-03-01 DIAGNOSIS — Z8673 Personal history of transient ischemic attack (TIA), and cerebral infarction without residual deficits: Secondary | ICD-10-CM

## 2020-03-01 DIAGNOSIS — I63232 Cerebral infarction due to unspecified occlusion or stenosis of left carotid arteries: Secondary | ICD-10-CM

## 2020-03-01 MED ORDER — LISINOPRIL 20 MG PO TABS: 20.0000 mg | ORAL_TABLET | Freq: Every day | ORAL | 2 refills | Status: DC

## 2020-03-01 NOTE — Telephone Encounter (Signed)
Called patient to go over information before appointment. Left message for patient to return call to office and to inform patient that provider was running behind.

## 2020-03-01 NOTE — Progress Notes (Signed)
Virtual Visit via Video Note I connected with [name] on 03/01/20 by a video enabled telemedicine application and verified that I am speaking with the correct person using two identifiers.  Location patient: home Location provider:work office Persons participating in the virtual visit: patient, provider  I discussed the limitations of evaluation and management by telemedicine and the availability of in person appointments. The patient expressed understanding and agreed to proceed.   HPI: [name] is a 49 yo female with hx of CVA,HTN,PAD, and HLD concerned about elevated BP. She is having PT after CVA, BP was taken and elevated at 179/89. Home BP 140's/90's. She has not noted visual changes, chest pain, dyspnea, palpitation,abdominal pain,N/V,new focal weakness, or edema.  She is on Lisinopril 5 mg daily and Metoprolol Tartrate 50 mg bid.  She is not back to work. Hospitalized from 01/1520 to 02/08/20. Completing PT on 03/16/20 and appt with neuro on 03/19/20.  No significant residual deficit. Independent ADL's and IADL's.  ROS: See pertinent positives and negatives per HPI.  Past Medical History:  Diagnosis Date   Hypertension     Past Surgical History:  Procedure Laterality Date   CESAREAN SECTION  1992   IR ANGIO INTRA EXTRACRAN SEL COM CAROTID INNOMINATE BILAT MOD SED  02/08/2020   IR ANGIO VERTEBRAL SEL VERTEBRAL BILAT MOD SED  02/08/2020   IR US GUIDE VASC ACCESS RIGHT  02/08/2020    Family History  Problem Relation Age of Onset   Diabetes Father     Social History   Socioeconomic History   Marital status: Single    Spouse name: Not on file   Number of children: 2   Years of education: Not on file   Highest education level: Not on file  Occupational History    Comment: works at [employer]   Tobacco Use   Smoking status: Never Smoker   Smokeless tobacco: Never Used  Substance and Sexual Activity   Alcohol use: Not Currently   Drug use: Never    Sexual activity: Not on file  Other Topics Concern   Not on file  Social History Narrative   [name] is a 49 year old patient who works full time at [employer] 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 She lives with and has support of her daughters (67 & 22). She is independent with care needs and denies issues with transportation to medical appointments    Right handed   Social Determinants of Health   Financial Resource Strain:    Difficulty of Paying Living Expenses:   Food Insecurity:    Worried About Charity fundraiser in the Last Year:    Arboriculturist in the Last Year:   Transportation Needs: No Transportation Needs   Lack of Transportation (Medical): No   Lack of Transportation (Non-Medical): No  Physical Activity:    Days of Exercise per Week:    Minutes of Exercise per Session:   Stress:    Feeling of Stress :   Social Connections:    Frequency of Communication with Friends and Family:    Frequency of Social Gatherings with Friends and Family:    Attends Religious Services:    Active Member of Clubs or Organizations:    Attends Archivist Meetings:    Marital Status:   Intimate Partner Violence:    Fear of Current or Ex-Partner:    Emotionally Abused:    Physically Abused:    Sexually  Abused:     Current Outpatient Medications:    aspirin 325 MG tablet, Take 1 tablet (325 mg total) by mouth daily., Disp: 360 tablet, Rfl: 0   atorvastatin (LIPITOR) 80 MG tablet, Take 1 tablet (80 mg total) by mouth daily., Disp: 90 tablet, Rfl: 0   busPIRone (BUSPAR) 5 MG tablet, Take 5 mg by mouth in the morning and at bedtime., Disp: , Rfl:    clopidogrel (PLAVIX) 75 MG tablet, Take 1 tablet (75 mg total) by mouth daily., Disp: 90 tablet, Rfl: 0   metoprolol tartrate (LOPRESSOR) 50 MG tablet, Take 1 tablet (50 mg total) by mouth 2 (two) times daily., Disp: 120 tablet, Rfl: 1   Multiple Vitamins-Minerals (MULTIVITAMIN GUMMIES  ADULT PO), Take 2 tablets by mouth daily. 2 gummies daily, Disp: , Rfl:    lisinopril (ZESTRIL) 20 MG tablet, Take 1 tablet (20 mg total) by mouth daily., Disp: 30 tablet, Rfl: 2  EXAM:  VITALS per patient if applicable:BP (!) 102/72    Pulse 90    Ht 5\' 6"  (1.676 m)    BMI 24.90 kg/m   GENERAL: alert, oriented, appears well and in no acute distress  HEENT: atraumatic, conjunctiva clear, no obvious abnormalities on inspection.  NECK: normal movements of the head and neck  LUNGS: on inspection no signs of respiratory distress, breathing rate appears normal, no obvious gross SOB, gasping or wheezing  CV: no obvious cyanosis  MS: moves all visible extremities without noticeable abnormality  PSYCH/NEURO: pleasant and cooperative, no obvious depression or anxiety, speech and thought processing grossly intact  ASSESSMENT AND PLAN:  Discussed the following assessment and plan:  Essential hypertension, benign - Plan: lisinopril (ZESTRIL) 20 MG tablet BP is not adequately controlled. Complications of elevated BP discussed. She agrees with increasing Lisinopril from 5 mg to 10 mg, if BP still elevated in 2-3 weeks she can increase Lisinopril from 10 mg to 20 mg. No changes in Metoprolol tartrate dose. Continue low salt diet and monitoring BP.  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 Independent ADL's. Keep appt with neuro. [facility] from 02/03/20 to 03/22/20. We could consider starting part time 1-2 weeks before.  I discussed the assessment and treatment plan with the patient. [name] was provided an opportunity to ask questions and all were answered. She agreed with the plan and demonstrated an understanding of the instructions.   Return in about 16 days (around 03/17/2020) for HTN.   Kurt Azimi [name], MD

## 2020-03-04 ENCOUNTER — Encounter: Payer: Self-pay | Admitting: Family Medicine

## 2020-03-06 ENCOUNTER — Telehealth: Payer: Self-pay

## 2020-03-06 ENCOUNTER — Other Ambulatory Visit: Payer: Self-pay | Admitting: *Deleted

## 2020-03-06 MED ORDER — ATORVASTATIN CALCIUM 80 MG PO TABS: 80.0000 mg | ORAL_TABLET | Freq: Every day | ORAL | 0 refills | Status: DC

## 2020-03-06 NOTE — Telephone Encounter (Signed)
Rx sent to the pharmacy as requested. ?

## 2020-03-07 ENCOUNTER — Encounter: Payer: Self-pay | Admitting: Family Medicine

## 2020-03-10 ENCOUNTER — Encounter: Payer: Self-pay | Admitting: Family Medicine

## 2020-03-10 ENCOUNTER — Telehealth: Payer: Self-pay | Admitting: Family Medicine

## 2020-03-10 NOTE — Telephone Encounter (Signed)
Pt is requesting a letter stating that she can be released back to work 03/18/20. Pt says, that Dr. [name], has done this in the past and needs a letter so she can get paid.  Please fax letter to [facility] fax [phone]. Thanks

## 2020-03-10 NOTE — Telephone Encounter (Signed)
Letter completed and faxed to [facility] as requested.

## 2020-03-13 ENCOUNTER — Telehealth: Payer: Self-pay | Admitting: Family Medicine

## 2020-03-13 ENCOUNTER — Other Ambulatory Visit: Payer: Self-pay | Admitting: *Deleted

## 2020-03-13 MED ORDER — AMLODIPINE BESYLATE 5 MG PO TABS
5.0000 mg | ORAL_TABLET | Freq: Every day | ORAL | 0 refills | Status: DC
Start: 2020-03-13 — End: 2020-03-20

## 2020-03-13 NOTE — Telephone Encounter (Signed)
For now let's leave Metoprolol and Lisinopril unchanged for now. Recommend adding Amlodipine 5 mg daily at bedtime[I do not think she has tried this med]. Keep next appt. Thanks, BJ

## 2020-03-13 NOTE — Telephone Encounter (Signed)
Patient notified and verbalized understanding. Amlodipine 5 mg sent to the pharmacy.

## 2020-03-13 NOTE — Telephone Encounter (Signed)
Pt stated the metoprolol is not working.  6/21: 158/106 6/20: 157/102 6/19: 140/96 6/18: 145/100 6/17: 148/111  Pt is wondering what to do from here?   Pt can be reached at [phone]

## 2020-03-13 NOTE — Telephone Encounter (Signed)
Please advise 

## 2020-03-14 ENCOUNTER — Telehealth: Payer: Self-pay | Admitting: Family Medicine

## 2020-03-14 ENCOUNTER — Encounter: Payer: Self-pay | Admitting: Family Medicine

## 2020-03-14 NOTE — Telephone Encounter (Signed)
Spoke with patient, letter updated to say she can return to work on 03/18/2020 with no restrictions per patient request. Letter sent to [facility] for patient and faxed to [phone] per patient request.

## 2020-03-14 NOTE — Telephone Encounter (Signed)
Patient needs a note written for work for short term disability.  Patient needs a call to discuss what said note should say.  Patient needs the note by June 26th.  [phone]

## 2020-03-20 ENCOUNTER — Ambulatory Visit: Payer: BC Managed Care – PPO | Admitting: Family Medicine

## 2020-03-20 ENCOUNTER — Other Ambulatory Visit: Payer: Self-pay

## 2020-03-20 ENCOUNTER — Encounter: Payer: Self-pay | Admitting: Family Medicine

## 2020-03-20 DIAGNOSIS — I63232 Cerebral infarction due to unspecified occlusion or stenosis of left carotid arteries: Secondary | ICD-10-CM

## 2020-03-20 DIAGNOSIS — I10 Essential (primary) hypertension: Secondary | ICD-10-CM

## 2020-03-20 MED ORDER — METOPROLOL TARTRATE 50 MG PO TABS: 50.0000 mg | ORAL_TABLET | Freq: Two times a day (BID) | ORAL | 2 refills | Status: DC

## 2020-03-20 MED ORDER — LISINOPRIL 20 MG PO TABS: 20.0000 mg | ORAL_TABLET | Freq: Every day | ORAL | 2 refills | Status: DC

## 2020-03-20 MED ORDER — AMLODIPINE BESYLATE 5 MG PO TABS: 5.0000 mg | ORAL_TABLET | Freq: Every day | ORAL | 2 refills | Status: DC

## 2020-03-20 NOTE — Assessment & Plan Note (Signed)
BP better controlled. No changes in current medications. Low salt diet recommended. Continue monitoring BP regularly.

## 2020-03-20 NOTE — Progress Notes (Addendum)
HPI: Jacqueline Medina is a 49 y.o. female with history of CVA, hypertension, and hyperlipidemia here today for HTN follow up. She was last seen on 03/01/2020, virtual visit. Last visit Lisinopril was increased from 10 mg and instructed to increase dose to 20 mg if BP still elevated. Amlodipine added on 03/13/2020.  She is on lisinopril 20 mg daily, amlodipine 5 mg daily, and metoprolol 50 mg twice daily.  She has tolerated medication well. BP readings at home: "Good", < 140/90. Negative for severe/frequent headache, visual changes, chest pain, dyspnea, palpitation, claudication, focal weakness, or edema.  Lab Results  Component Value Date   CREATININE 0.89 02/28/2020   BUN 15 02/28/2020   NA 143 02/28/2020   K 4.2 02/28/2020   CL 107 02/28/2020   CO2 30 02/28/2020   CVA in 01/2020. Currently she is on Plavix 75 mg daily and Aspirin 325 mg daily. Also on atorvastatin 80 mg daily. She has no significant residual deficit. She completed PT yesterday. She has an appointment with neurologist tomorrow.  Review of Systems  Constitutional: Negative for activity change, appetite change, fatigue and fever.  HENT: Negative for mouth sores, nosebleeds and sore throat.   Respiratory: Negative for cough and wheezing.   Gastrointestinal: Negative for abdominal pain, nausea and vomiting.       Negative for changes in bowel habits.  Genitourinary: Negative for decreased urine volume and hematuria.  Neurological: Negative for syncope and facial asymmetry.  Psychiatric/Behavioral: Negative for confusion.  Rest of ROS, see pertinent positives sand negatives in HPI  Current Outpatient Medications on File Prior to Visit  Medication Sig Dispense Refill  . aspirin 325 MG tablet Take 1 tablet (325 mg total) by mouth daily. 360 tablet 0  . atorvastatin (LIPITOR) 80 MG tablet Take 1 tablet (80 mg total) by mouth daily. 90 tablet 0  . busPIRone (BUSPAR) 5 MG tablet Take 5 mg by mouth in the  morning and at bedtime.    . clopidogrel (PLAVIX) 75 MG tablet Take 1 tablet (75 mg total) by mouth daily. 90 tablet 0  . Multiple Vitamins-Minerals (MULTIVITAMIN GUMMIES ADULT PO) Take 2 tablets by mouth daily. 2 gummies daily     No current facility-administered medications on file prior to visit.   Past Medical History:  Diagnosis Date  . Hypertension    No Known Allergies  Social History   Socioeconomic History  . Marital status: Single    Spouse name: Not on file  . Number of children: 2  . Years of education: Not on file  . Highest education level: Not on file  Occupational History    Comment: works at [employer]  . Smoking status: Never Smoker  . Smokeless tobacco: Never Used  Substance and Sexual Activity  . Alcohol use: Not Currently  . Drug use: Never  . Sexual activity: Not on file  Other Topics Concern  . Not on file  Social History Narrative   [name] is a 49 year old patient who works full time at [employer] 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 She lives with and has support of her daughters (85 & 67). She is independent with care needs and denies issues with transportation to medical appointments    Right handed   Social Determinants of Health   Financial Resource Strain:   . Difficulty of Paying Living Expenses:   Food Insecurity:   . Worried About Estate manager/land agent  of Food in the Last Year:   . [place] in the Last Year:   Transportation Needs: No Transportation Needs  . Lack of Transportation (Medical): No  . Lack of Transportation (Non-Medical): No  Physical Activity:   . Days of Exercise per Week:   . Minutes of Exercise per Session:   Stress:   . Feeling of Stress :   Social Connections:   . Frequency of Communication with Friends and Family:   . Frequency of Social Gatherings with Friends and Family:   . Attends Religious Services:   . Active Member of Clubs or Organizations:   . Attends Theatre manager Meetings:   Marland Kitchen Marital Status:    Vitals:   03/20/20 1449  BP: 132/80  Pulse: 75  Resp: 12  Temp: 98.2 F (36.8 C)  SpO2: 98%   Body mass index is 24.86 kg/m.  Physical Exam Vitals and nursing note reviewed.  Constitutional:      General: She is not in acute distress.    Appearance: She is well-developed and normal weight.  HENT:     Head: Normocephalic and atraumatic.     Mouth/Throat:     Mouth: Mucous membranes are moist.     Pharynx: Oropharynx is clear.  Eyes:     Conjunctiva/sclera: Conjunctivae normal.     Pupils: Pupils are equal, round, and reactive to light.  Cardiovascular:     Rate and Rhythm: Normal rate and regular rhythm.     Heart sounds: No murmur heard.      Comments: PT present bilateral. Pulmonary:     Effort: Pulmonary effort is normal. No respiratory distress.     Breath sounds: Normal breath sounds.  Abdominal:     Palpations: Abdomen is soft. There is no hepatomegaly or mass.     Tenderness: There is no abdominal tenderness.  Lymphadenopathy:     Cervical: No cervical adenopathy.  Skin:    General: Skin is warm.     Findings: No erythema or rash.  Neurological:     General: No focal deficit present.     Mental Status: She is alert and oriented to person, place, and time.     Cranial Nerves: No cranial nerve deficit.     Gait: Gait normal.  Psychiatric:     Comments: Well groomed, good eye contact.     ASSESSMENT AND PLAN:  [name] was seen today for hypertension follow-up.  No orders of the defined types were placed in this encounter.  CVA (cerebral vascular accident) ([place]) Complete 3 months of Plavix. Continue Aspirin 325 mg daily, eventually we can consider decreasing to 81 mg daily if neurologist considers it appropriate. Continue Atorvastatin. Keep appt with neurologist.  Essential hypertension, benign BP better controlled. No changes in current medications. Low salt diet  recommended. Continue monitoring BP regularly.   Return in about 5 months (around 08/30/2020) for CPE.   Jacqueline Balderson G. [name], MD  [facility]. [place] office.  A few things to remember from today's visit:   Essential hypertension, benign - Plan: amLODipine (NORVASC) 5 MG tablet, lisinopril (ZESTRIL) 20 MG tablet  If you need refills please call your pharmacy. Do not use My Chart to request refills or for acute issues that need immediate attention.   No changes today. Continue monitoring blood pressure regularly and low salt diet.  Please be sure medication list is accurate. If a new problem present, please set up appointment sooner than planned  today.

## 2020-03-20 NOTE — Assessment & Plan Note (Signed)
Complete 3 months of Plavix. Continue Aspirin 325 mg daily, eventually we can consider decreasing to 81 mg daily if neurologist considers it appropriate. Continue Atorvastatin. Keep appt with neurologist.

## 2020-03-20 NOTE — Patient Instructions (Signed)
A few things to remember from today's visit:   Essential hypertension, benign - Plan: amLODipine (NORVASC) 5 MG tablet, lisinopril (ZESTRIL) 20 MG tablet  If you need refills please call your pharmacy. Do not use My Chart to request refills or for acute issues that need immediate attention.   No changes today. Continue monitoring blood pressure regularly and low salt diet.  Please be sure medication list is accurate. If a new problem present, please set up appointment sooner than planned today.

## 2020-03-21 ENCOUNTER — Ambulatory Visit: Payer: BC Managed Care – PPO | Admitting: Neurology

## 2020-03-21 ENCOUNTER — Encounter: Payer: Self-pay | Admitting: Neurology

## 2020-03-21 VITALS — BP 127/83 | HR 65 | Ht 66.0 in | Wt 155.0 lb

## 2020-03-21 DIAGNOSIS — E7521 Fabry (-Anderson) disease: Secondary | ICD-10-CM

## 2020-03-21 DIAGNOSIS — I6521 Occlusion and stenosis of right carotid artery: Secondary | ICD-10-CM | POA: Diagnosis not present

## 2020-03-21 DIAGNOSIS — I6522 Occlusion and stenosis of left carotid artery: Secondary | ICD-10-CM | POA: Diagnosis not present

## 2020-03-21 MED ORDER — CLOPIDOGREL BISULFATE 75 MG PO TABS: 75.0000 mg | ORAL_TABLET | Freq: Every day | ORAL | 0 refills | Status: DC

## 2020-03-21 NOTE — Patient Instructions (Signed)
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  Am not sure specific treatment is necessary but I will look it up and let her know interestingly her sister also has cerebrovascular disease at young age but large vessel stenosis would be unusual in 18 disease which typically involves only 2 small sized blood vessels.  I recommend she stay on aspirin and Plavix for 2 more months and then stop aspirin and stay on Plavix alone for secondary stroke prevention and maintain aggressive risk factor modification with strict control of hypertension with blood pressure goal below 130/90, lipids with LDL cholesterol goal below 70 mg percent and diabetes with hemoglobin A1c goal below 6.5%.  She was encouraged to eat a healthy diet and to be active and exercise regularly.  Check follow-up lipid profile today.  Return for follow-up in the future in 3 months or call earlier if necessary.  Stroke Prevention Some medical conditions and behaviors are associated with a higher chance of having a stroke. You can help prevent a stroke by making nutrition, lifestyle, and other changes, including managing any medical conditions you may have. What nutrition changes can be made?   Eat healthy foods. You can do this by: ? Choosing foods high in fiber, such as fresh fruits and vegetables and whole grains. ? Eating at least 5 or more servings of fruits and vegetables a day. Try to fill half of your plate at each meal with fruits and vegetables. ? Choosing lean protein foods, such as lean cuts of meat, poultry without skin, fish, tofu, beans, and nuts. ? Eating low-fat dairy products. ? Avoiding foods that are high in salt (sodium). This can help lower blood pressure. ? Avoiding foods  that have saturated fat, trans fat, and cholesterol. This can help prevent high cholesterol. ? Avoiding processed and premade foods.  Follow your health care provider's specific guidelines for losing weight, controlling high blood pressure (hypertension), lowering high cholesterol, and managing diabetes. These may include: ? Reducing your daily calorie intake. ? Limiting your daily sodium intake to 1,500 milligrams (mg). ? Using only healthy fats for cooking, such as olive oil, canola oil, or sunflower oil. ? Counting your daily carbohydrate intake. What lifestyle changes can be made?  Maintain a healthy weight. Talk to your health care provider about your ideal weight.  Get at least 30 minutes of moderate physical activity at least 5 days a week. Moderate activity includes brisk walking, biking, and swimming.  Do not use any products that contain nicotine or tobacco, such as cigarettes and e-cigarettes. If you need help quitting, ask your health care provider. It may also be helpful to avoid exposure to secondhand smoke.  Limit alcohol intake to no more than 1 drink a day for nonpregnant women and 2 drinks a day for men. One drink equals 12 oz of beer, 5 oz of wine, or 1 oz of hard liquor.  Stop any illegal drug use.  Avoid taking birth control pills. Talk to your health care provider about the risks of taking birth control pills if: ? You are over 22 years old. ? You smoke. ? You get migraines. ? You have ever had a blood clot. What other changes can be made?  Manage your cholesterol levels. ? Eating a healthy  diet is important for preventing high cholesterol. If cholesterol cannot be managed through diet alone, you may also need to take medicines. ? Take any prescribed medicines to control your cholesterol as told by your health care provider.  Manage your diabetes. ? Eating a healthy diet and exercising regularly are important parts of managing your blood sugar. If your blood  sugar cannot be managed through diet and exercise, you may need to take medicines. ? Take any prescribed medicines to control your diabetes as told by your health care provider.  Control your hypertension. ? To reduce your risk of stroke, try to keep your blood pressure below 130/80. ? Eating a healthy diet and exercising regularly are an important part of controlling your blood pressure. If your blood pressure cannot be managed through diet and exercise, you may need to take medicines. ? Take any prescribed medicines to control hypertension as told by your health care provider. ? Ask your health care provider if you should monitor your blood pressure at home. ? Have your blood pressure checked every year, even if your blood pressure is normal. Blood pressure increases with age and some medical conditions.  Get evaluated for sleep disorders (sleep apnea). Talk to your health care provider about getting a sleep evaluation if you snore a lot or have excessive sleepiness.  Take over-the-counter and prescription medicines only as told by your health care provider. Aspirin or blood thinners (antiplatelets or anticoagulants) may be recommended to reduce your risk of forming blood clots that can lead to stroke.  Make sure that any other medical conditions you have, such as atrial fibrillation or atherosclerosis, are managed. What are the warning signs of a stroke? The warning signs of a stroke can be easily remembered as BEFAST.  B is for balance. Signs include: ? Dizziness. ? Loss of balance or coordination. ? Sudden trouble walking.  E is for eyes. Signs include: ? A sudden change in vision. ? Trouble seeing.  F is for face. Signs include: ? Sudden weakness or numbness of the face. ? The face or eyelid drooping to one side.  A is for arms. Signs include: ? Sudden weakness or numbness of the arm, usually on one side of the body.  S is for speech. Signs include: ? Trouble speaking  (aphasia). ? Trouble understanding.  T is for time. ? These symptoms may represent a serious problem that is an emergency. Do not wait to see if the symptoms will go away. Get medical help right away. Call your local emergency services (911 in the [place] Do not drive yourself to the hospital.  Other signs of stroke may include: ? A sudden, severe headache with no known cause. ? Nausea or vomiting. ? Seizure. Where to find more information For more information, visit:  American Stroke Association: www.strokeassociation.org  National Stroke Association: www.stroke.org Summary  You can prevent a stroke by eating healthy, exercising, not smoking, limiting alcohol intake, and managing any medical conditions you may have.  Do not use any products that contain nicotine or tobacco, such as cigarettes and e-cigarettes. If you need help quitting, ask your health care provider. It may also be helpful to avoid exposure to secondhand smoke.  Remember BEFAST for warning signs of stroke. Get help right away if you or a loved one has any of these signs. This information is not intended to replace advice given to you by your health care provider. Make sure you discuss any questions you have with your health care  provider. Document Revised: 08/22/2017 Document Reviewed: 10/15/2016 Elsevier Patient Education  2020 [employer].

## 2020-03-21 NOTE — Progress Notes (Signed)
[facility] [address] [place]. [place] [address] [phone]       OFFICE CONSULT NOTE  Jacqueline Medina Date of Birth:  09-Aug-1971 Medical Record Number:  [id number]   Referring MD:  Rosalin Hawking  Reason for Referral: Stroke  HPI: Jacqueline Medina is a pleasant 49 year old African-American lady seen today for initial office consultation visit.  She comes accompanied by her daughter.  History is obtained from them, review of electronic medical records and I personally reviewed imaging films in PACS.  She has no significant past medical history except hypertension hyperlipidemia and anxiety.  She presented on 02/05/2020 with weakness in the right hand with starting to drop objects and having numbness for a couple of days prior to presentation.  NIH stroke scale on admission was 0.  She had mild weakness of the right grip and hand muscles.  CT scan of the head was unremarkable but CT angiogram showed left supraclinoid ICA occlusion with reconstituted collateral supply the left anterior middle cerebral territories.  The distal right ICA was abnormally small but was patent and supplying the right MCA.  There was occlusion to severe stenosis of the right anterior cerebral artery.  MRI scan of the brain showed multiple small acute infarcts involving right ACA and left MCA territory in the precentral gyrus.  There was also a subacute small left frontal infarct noted.  There are extensive changes of chronic small vessel disease.  Patient underwent diagnostic cerebral catheter angiogram by Dr. Estanislado Pandy on 02/08/2020 which confirmed occluded left ICA terminus and 50 to 60% stenosis of the right ICA M1 segment and both posterior cerebral arteries were prominent.  2D echo showed normal ejection fraction without cardiac source embolism.  LDL cholesterol was marginally elevated at 107 mg percent and hemoglobin A1c was 5.5.  HIV was negative.  ESR was normal C-reactive protein was elevated at  3.7.  ANA, antineutrophilic cytoplasmic antibodies, antidsDNA, Sjogren's antibodies and rheumatoid factor were all negative.  HIV was negative.  Patient underwent alpha galactosidase testing which came low at 25.7 within normal limits being greater than 35.  The patient however lacks other clinical symptoms of Fabry's disease and denies painful acroparesthesias, skin lesions or kidney problems.  She does not have any cataracts or vision problems either.  Interestingly the patient has a sister who is older than her Jacqueline Medina dob 01/02/62) who also had significant occlusive intracranial disease and Dr. Estanislado Pandy did angioplasty stenting in 2013.  Patient states she is done well since discharge.  She was placed on aspirin and Plavix which is tolerating well with only minor bruising and no bleeding episodes.  She has no recurrence of TIA or stroke symptoms.  She states her blood pressures well controlled today it is 127/83.  She is tolerating Lipitor 80 mg well without any significant muscle aches or joint pains.  She works at [employer].  Return back to baseline.  No complaints or deficits.  ROS:   14 system review of systems is positive for hand weakness, decreased dexterity, numbness and all other systems negative  PMH:  Past Medical History:  Diagnosis Date  . Hypertension   . Stroke [facility])     Social History:  Social History   Socioeconomic History  . Marital status: Single    Spouse name: Not on file  . Number of children: 2  . Years of education: Not on file  . Highest education level: Not on file  Occupational History    Comment:  works at [employer]  . Smoking status: Never Smoker  . Smokeless tobacco: Never Used  Substance and Sexual Activity  . Alcohol use: Not Currently  . Drug use: Never  . Sexual activity: Not on file  Other Topics Concern  . Not on file  Social History Narrative   Jacqueline Medina is a 49 year old patient who works full time at  [employer] 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 She lives with and has support of her daughters (56 & 33). She is independent with care needs and denies issues with transportation to medical appointments    Right handed   Social Determinants of Health   Financial Resource Strain:   . Difficulty of Paying Living Expenses:   Food Insecurity:   . Worried About Charity fundraiser in the Last Year:   . Arboriculturist in the Last Year:   Transportation Needs: No Transportation Needs  . Lack of Transportation (Medical): No  . Lack of Transportation (Non-Medical): No  Physical Activity:   . Days of Exercise per Week:   . Minutes of Exercise per Session:   Stress:   . Feeling of Stress :   Social Connections:   . Frequency of Communication with Friends and Family:   . Frequency of Social Gatherings with Friends and Family:   . Attends Religious Services:   . Active Member of Clubs or Organizations:   . Attends Archivist Meetings:   Marland Kitchen Marital Status:   Intimate Partner Violence:   . Fear of Current or Ex-Partner:   . Emotionally Abused:   Marland Kitchen Physically Abused:   . Sexually Abused:     Medications:   Current Outpatient Medications on File Prior to Visit  Medication Sig Dispense Refill  . amLODipine (NORVASC) 5 MG tablet Take 1 tablet (5 mg total) by mouth at bedtime. 90 tablet 2  . aspirin 325 MG tablet Take 1 tablet (325 mg total) by mouth daily. 360 tablet 0  . atorvastatin (LIPITOR) 80 MG tablet Take 1 tablet (80 mg total) by mouth daily. 90 tablet 0  . busPIRone (BUSPAR) 5 MG tablet Take 5 mg by mouth in the morning and at bedtime.    Marland Kitchen lisinopril (ZESTRIL) 20 MG tablet Take 1 tablet (20 mg total) by mouth daily. 90 tablet 2  . lisinopril (ZESTRIL) 5 MG tablet Take 5 mg by mouth daily.    . metoprolol tartrate (LOPRESSOR) 50 MG tablet Take 1 tablet (50 mg total) by mouth 2 (two) times daily. 180 tablet 2  . Multiple Vitamins-Minerals (MULTIVITAMIN GUMMIES  ADULT PO) Take 2 tablets by mouth daily. 2 gummies daily     No current facility-administered medications on file prior to visit.    Allergies:  No Known Allergies  Physical Exam General: well developed, well nourished middle-aged African-American lady, seated, in no evident distress Head: head normocephalic and atraumatic.   Neck: supple with no carotid or supraclavicular bruits Cardiovascular: regular rate and rhythm, no murmurs Musculoskeletal: no deformity Skin:  no rash/petichiae Vascular:  Normal pulses all extremities  Neurologic Exam Mental Status: Awake and fully alert. Oriented to place and time. Recent and remote memory intact. Attention span, concentration and fund of knowledge appropriate. Mood and affect appropriate.  Cranial Nerves: Fundoscopic exam reveals sharp disc margins. Pupils equal, briskly reactive to light. Extraocular movements full without nystagmus. Visual fields full to confrontation. Hearing intact. Facial sensation intact. Face, tongue,  palate moves normally and symmetrically.  Motor: Normal bulk and tone. Normal strength in all tested extremity muscles. Sensory.: intact to touch , pinprick , position and vibratory sensation.  Coordination: Rapid alternating movements normal in all extremities. Finger-to-nose and heel-to-shin performed accurately bilaterally. Gait and Station: Arises from chair without difficulty. Stance is normal. Gait demonstrates normal stride length and balance . Able to heel, toe and tandem walk without difficulty.  Reflexes: 1+ and symmetric. Toes downgoing.   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   She lacks skin lesions, acroparesthesias and others clinical symptoms of Fabry's.  Fabry's typically affects small vessels and not large vessels in the brain also.  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 Estanislado Pandy.     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  Am not sure specific treatment is necessary but I will look it up and let her know interestingly her sister also has cerebrovascular disease at young age but large vessel stenosis would be unusual in 38 disease which typically involves only 2 small sized blood vessels.  I recommend she stay on aspirin and Plavix for 2 more months and then stop aspirin and stay on Plavix alone for secondary stroke prevention and maintain aggressive risk factor modification with strict control of hypertension with blood pressure goal below 130/90, lipids with LDL cholesterol goal below 70 mg percent and diabetes with hemoglobin A1c goal below 6.5%.  She was encouraged to eat a healthy diet and to be active and exercise regularly.  Check follow-up lipid profile today.  Greater than 50% time during this prolonged 60 minute consultation visit was performed on counseling and coordination of care about her strokes and discussion about Fabry's disease with the return for follow-up in the future in 3 months or call earlier if necessary. Antony Contras, MD  [facility] [address] [place], [place] [address]  Phone [phone] Fax [phone] Note: This document was prepared with digital dictation and possible smart phrase technology. Any transcriptional errors that  result from this process are unintentional.

## 2020-03-22 LAB — LIPID PANEL
Chol/HDL Ratio: 4.1 ratio (ref 0.0–4.4)
Cholesterol, Total: 149 mg/dL (ref 100–199)
HDL: 36 mg/dL — ABNORMAL LOW (ref >39)
LDL Chol Calc (NIH): 86 mg/dL (ref 0–99)
Triglycerides: 154 mg/dL — ABNORMAL HIGH (ref 0–149)
VLDL Cholesterol Cal: 27 mg/dL (ref 5–40)

## 2020-03-28 ENCOUNTER — Telehealth: Payer: Self-pay | Admitting: Neurology

## 2020-03-28 DIAGNOSIS — R569 Unspecified convulsions: Secondary | ICD-10-CM

## 2020-03-28 NOTE — Addendum Note (Signed)
Addended by: Kathrynn Ducking on: 03/28/2020 04:49 PM   Modules accepted: Orders

## 2020-03-28 NOTE — Telephone Encounter (Signed)
Sent to Dr. Leonie Man for [place].

## 2020-03-28 NOTE — Telephone Encounter (Signed)
Patient wanted to let Dr. Leonie Man know that her right hand was trembling on July 3 rd . Patient stated her hand trembled for a few minutes and then it stopped.  Patient states her hand has not t trembled  any more since then . I relayed to patient that Dr. Leonie Man was out of the office will send to work in . Patient states she is fine.

## 2020-03-28 NOTE — Telephone Encounter (Signed)
I called the patient.  The patient has had a recent left brain stroke event.  On 23 March 2020 she had a 1 minute episode of some jerking of the right hand only without any cognitive changes.  She had a similar event she says 3 or 4 months ago.  I will get the patient set up for an EEG study, the above may have represented a focal seizure.  If she continues to have events, may empirically begin an anticonvulsant medication.

## 2020-04-03 NOTE — Telephone Encounter (Signed)
Thanks & noted

## 2020-04-09 NOTE — Progress Notes (Signed)
Kindly inform the patient that cholesterol profile is satisfactory and   improved from 2 months ago

## 2020-04-10 ENCOUNTER — Telehealth: Payer: Self-pay | Admitting: *Deleted

## 2020-04-10 NOTE — Telephone Encounter (Signed)
Jacqueline Fila, MD  Darleen Crocker, RN Cc: [name], [name], MD Kindly inform the patient that cholesterol profile is satisfactory and  improved from 2 months ago    I spoke to the patient and she verbalized understanding of these results.

## 2020-04-19 ENCOUNTER — Other Ambulatory Visit: Payer: BC Managed Care – PPO

## 2020-04-19 ENCOUNTER — Encounter: Payer: Self-pay | Admitting: Neurology

## 2020-05-11 ENCOUNTER — Other Ambulatory Visit (HOSPITAL_COMMUNITY): Payer: Self-pay | Admitting: Interventional Radiology

## 2020-05-11 DIAGNOSIS — I639 Cerebral infarction, unspecified: Secondary | ICD-10-CM

## 2020-05-11 DIAGNOSIS — I771 Stricture of artery: Secondary | ICD-10-CM

## 2020-05-17 ENCOUNTER — Other Ambulatory Visit: Payer: BC Managed Care – PPO

## 2020-05-17 DIAGNOSIS — R569 Unspecified convulsions: Secondary | ICD-10-CM

## 2020-05-30 ENCOUNTER — Telehealth: Payer: Self-pay

## 2020-05-30 NOTE — Telephone Encounter (Signed)
Pt left a VM asking for a call back. Please call [phone].

## 2020-05-31 ENCOUNTER — Ambulatory Visit (HOSPITAL_COMMUNITY)
Admission: RE | Admit: 2020-05-31 | Discharge: 2020-05-31 | Disposition: A | Discharge status: Home / Self Care | Payer: BC Managed Care – PPO | Source: Ambulatory Visit | Attending: Interventional Radiology | Admitting: Interventional Radiology

## 2020-05-31 ENCOUNTER — Other Ambulatory Visit: Payer: Self-pay

## 2020-05-31 ENCOUNTER — Other Ambulatory Visit: Payer: Self-pay | Admitting: Neurology

## 2020-05-31 DIAGNOSIS — R569 Unspecified convulsions: Secondary | ICD-10-CM

## 2020-05-31 DIAGNOSIS — I771 Stricture of artery: Secondary | ICD-10-CM | POA: Insufficient documentation

## 2020-05-31 DIAGNOSIS — G40909 Epilepsy, unspecified, not intractable, without status epilepticus: Secondary | ICD-10-CM

## 2020-05-31 DIAGNOSIS — I639 Cerebral infarction, unspecified: Secondary | ICD-10-CM | POA: Insufficient documentation

## 2020-05-31 IMAGING — CT CT ANGIO HEAD
1 of 13 series · 13 of 47 positions shown · IV contrast (omnipaque)
Comparison: Head MRI and MRA [DATE]. Head and neck CTA
[DATE].

CLINICAL DATA: Stroke.  Arterial stenosis.

EXAM:
CT ANGIOGRAPHY HEAD AND NECK
TECHNIQUE: Multidetector CT imaging of the head and neck was performed using
the standard protocol during bolus administration of intravenous
contrast. Multiplanar CT image reconstructions and MIPs were
obtained to evaluate the vascular anatomy. Carotid stenosis
measurements (when applicable) are obtained utilizing NASCET
criteria, using the distal internal carotid diameter as the
denominator.
CONTRAST:  50mL OMNIPAQUE IOHEXOL 350 MG/ML SOLN

[Series 10: thin · axial · 0.41mm/px · z∈[-352,-52]mm · 13 of 701 slices shown]
[im 51/701  brain]
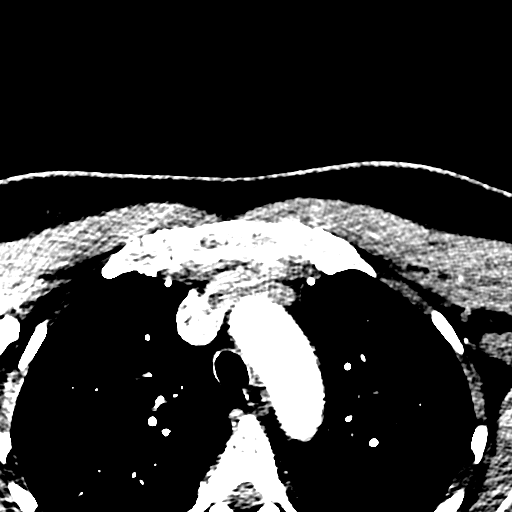
[im 101/701  bone]
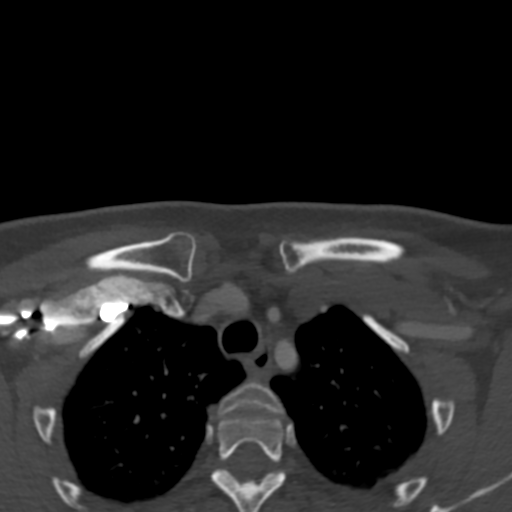
[im 151/701  brain]
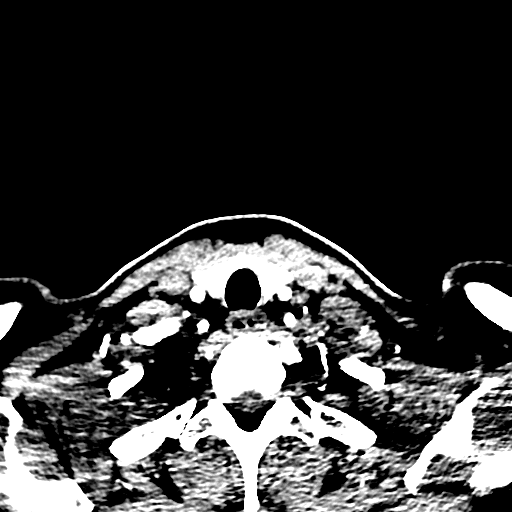
[im 201/701  bone]
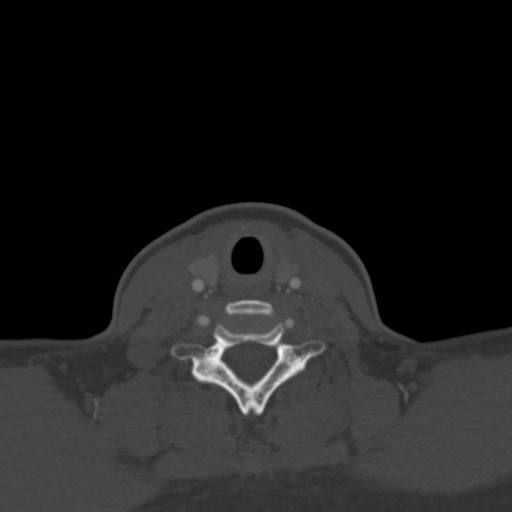
[im 251/701  brain]
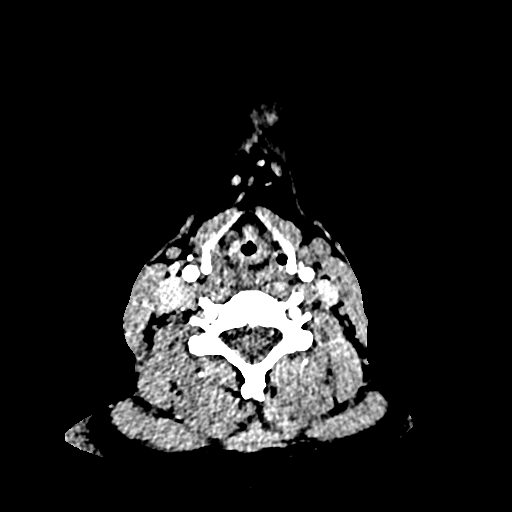
[im 301/701  bone]
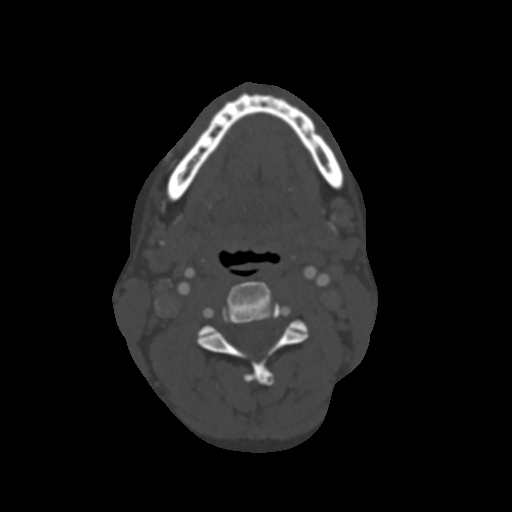
[im 351/701  brain]
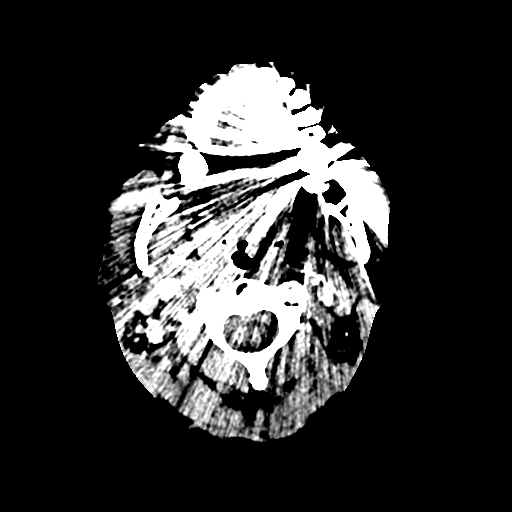
[im 401/701  bone]
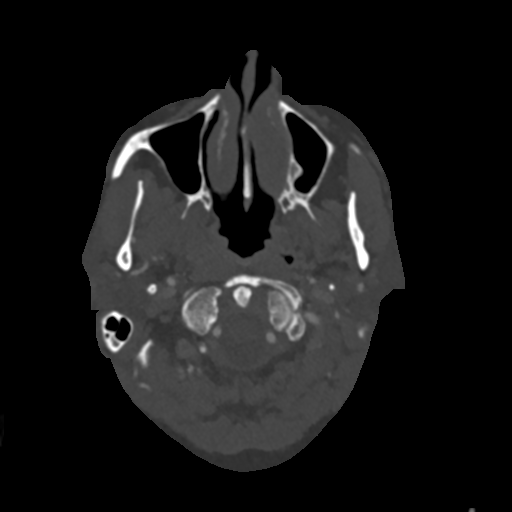
[im 451/701  brain]
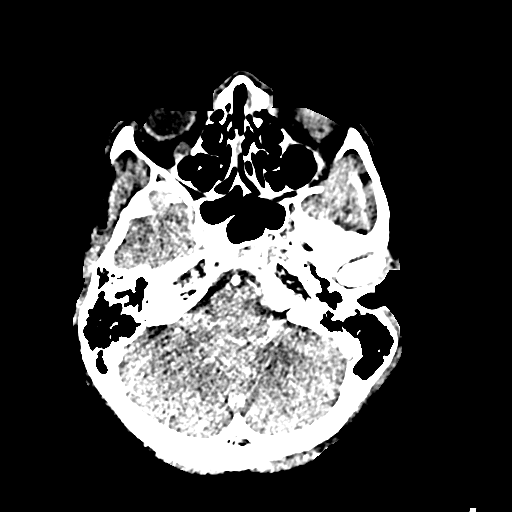
[im 501/701  bone]
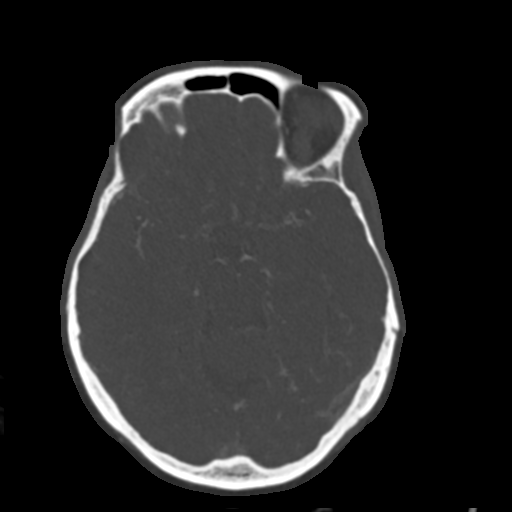
[im 551/701  brain]
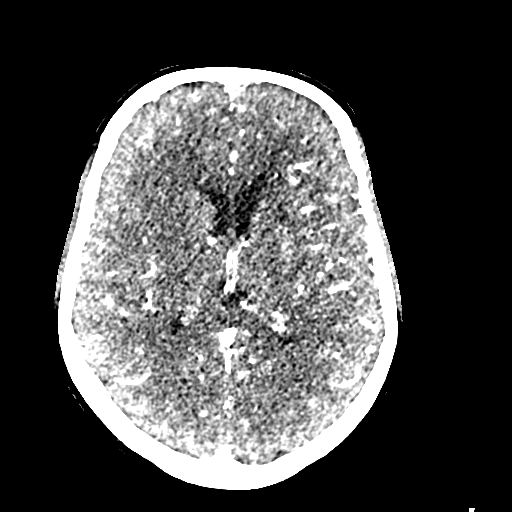
[im 601/701  bone]
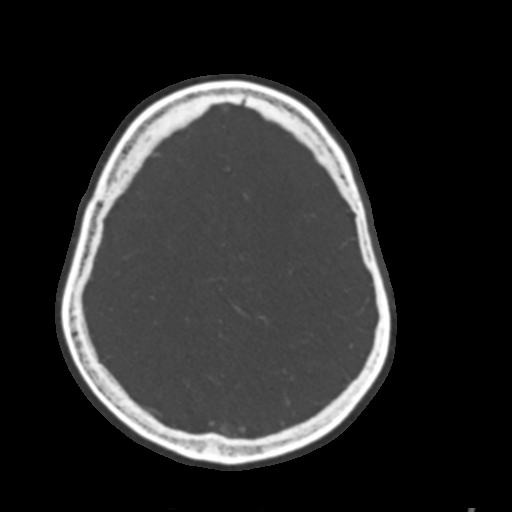
[im 651/701  brain]
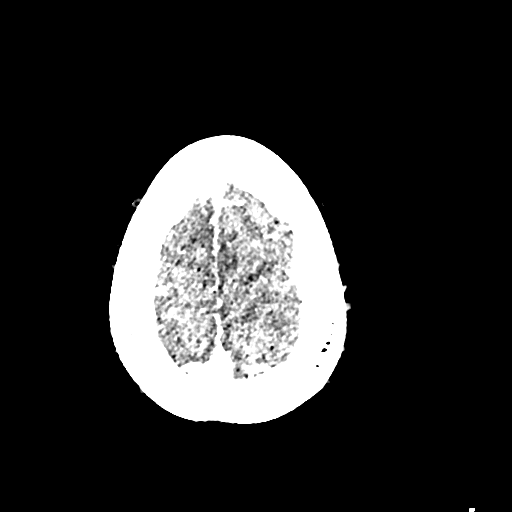

[13 of 47 positions shown; findings below may reference images not displayed]

FINDINGS: CT HEAD FINDINGS

Brain: No acute large territory infarct, acute intracranial
hemorrhage, mass, midline shift, or extra-axial fluid collection is
identified. The ventricles are normal in size. Hypodensities in the
cerebral white matter bilaterally are nonspecific but compatible
with moderately age advanced chronic small vessel ischemic disease.
There is a small chronic left frontal infarct which was subacute on
the prior studies. There is also a chronic left basal ganglia
infarct. Subtle hypodensities in the right ACA territory correspond
to the small acute infarcts on the prior MRI.

Vascular: No hyperdense vessel.

Skull: No fracture suspicious osseous lesion.

Sinuses: Visualized paranasal sinuses and mastoid air cells are
clear.

Orbits: Unremarkable.

Review of the MIP images confirms the above findings

CTA NECK FINDINGS

Aortic arch: Standard 3 vessel aortic arch with widely patent arch
vessel origins.

Right carotid system: Patent with unchanged minimal low-density wall
thickening at the carotid bifurcation. No evidence of dissection or
stenosis.

Left carotid system: Widely patent common carotid artery. Patent but
diffusely small cervical ICA beginning at the distal aspect of the
bulb without superimposed focal stenosis in the neck, unchanged.

Vertebral arteries: Patent and codominant without evidence of
dissection or stenosis.

Skeleton: No acute osseous abnormality or suspicious osseous lesion.

Other neck: Mildly prominence, heterogeneously enhancing level IIA
lymph nodes are stable to minimally larger than on the prior CTA,
measuring up to 8 mm in short axis on the right and 11 mm on the
left. No new or progressive lymphadenopathy is evident elsewhere in
the neck.

Upper chest: Clear lung apices.

Review of the MIP images confirms the above findings

CTA HEAD FINDINGS

Anterior circulation: The intracranial right ICA is patent with
moderate supraclinoid narrowing and a moderate to severe stenosis in
the region of the ICA terminus which may have mildly progressed from
the prior CTA. The right MCA is patent with a moderate proximal M1
stenosis. The left ICA is patent from the petrous through cavernous
segments but remains occluded beginning just distal to the
ophthalmic artery origin through the terminus. The left MCA and both
ACAs again appear occluded at their origins with multiple small
regional collateral vessels and with similar appearance of distal
reconstitution. No aneurysm is identified.

Posterior circulation: The intracranial vertebral arteries are
widely patent to the basilar. Patent PICA, AICA, and SCA origins are
identified bilaterally. The basilar artery is widely patent. There
is a diminutive left posterior communicating artery. Both PCAs are
patent without evidence of a significant proximal stenosis. No
aneurysm is identified.

Venous sinuses: As permitted by contrast timing, patent.

Anatomic variants: None.

Review of the MIP images confirms the above findings
IMPRESSION: 1. Unchanged occlusion of the distal left ICA, proximal left MCA,
and proximal ACAs with distal reconstitution via collaterals.
2. Moderate to severe stenosis of the right ICA terminus, possibly
mildly progressed from the prior CTA.
3. Patent cervical carotid and vertebral arteries without
significant focal stenosis.
4. Moderately age advanced chronic small vessel ischemic disease
with old infarcts as above.
5. Unchanged to minimally increased size of nonspecific bilateral
level II lymph nodes.

## 2020-05-31 IMAGING — CT CT ANGIO NECK
1 of 13 series · 12 of 46 positions shown, 17 images · IV contrast (omnipaque)
Comparison: Head MRI and MRA [DATE]. Head and neck CTA
[DATE].

CLINICAL DATA: Stroke.  Arterial stenosis.

EXAM:
CT ANGIOGRAPHY HEAD AND NECK
TECHNIQUE: Multidetector CT imaging of the head and neck was performed using
the standard protocol during bolus administration of intravenous
contrast. Multiplanar CT image reconstructions and MIPs were
obtained to evaluate the vascular anatomy. Carotid stenosis
measurements (when applicable) are obtained utilizing NASCET
criteria, using the distal internal carotid diameter as the
denominator.
CONTRAST:  50mL OMNIPAQUE IOHEXOL 350 MG/ML SOLN

[Series 10: thin · axial · 0.41mm/px · z∈[-354,-51]mm · 12 of 701 slices shown, 17 images]
[im 47/701  soft-tissue]
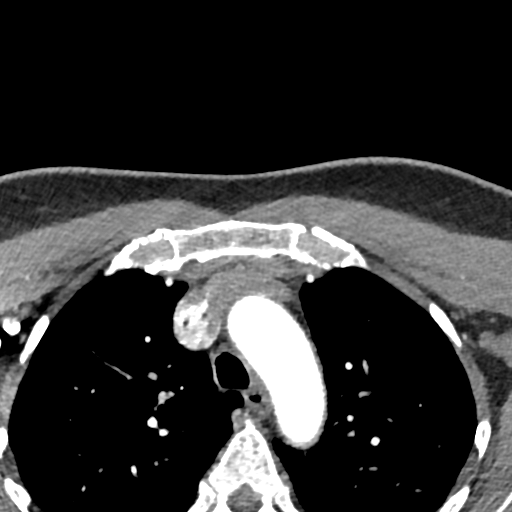
[im 47/701  bone]
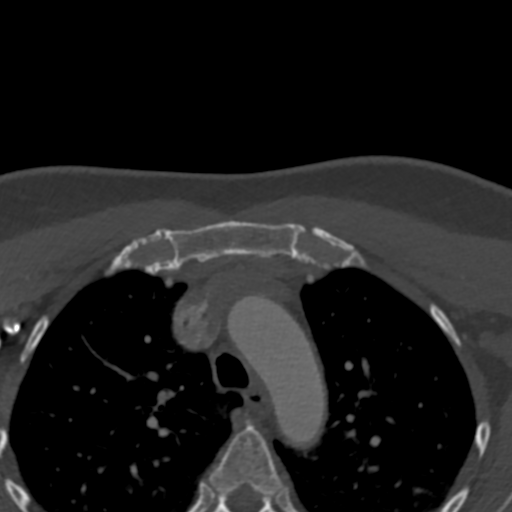
[im 94/701  soft-tissue]
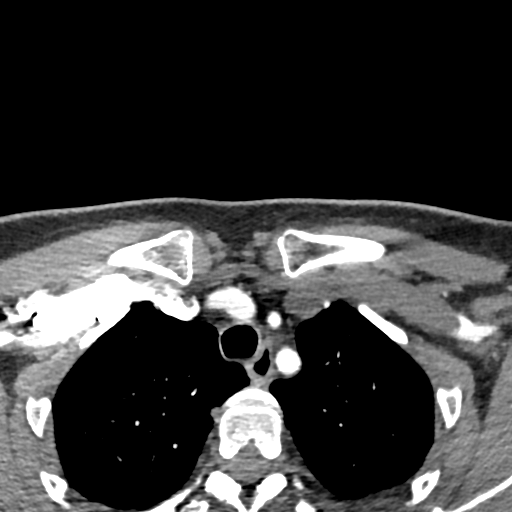
[im 187/701  soft-tissue]
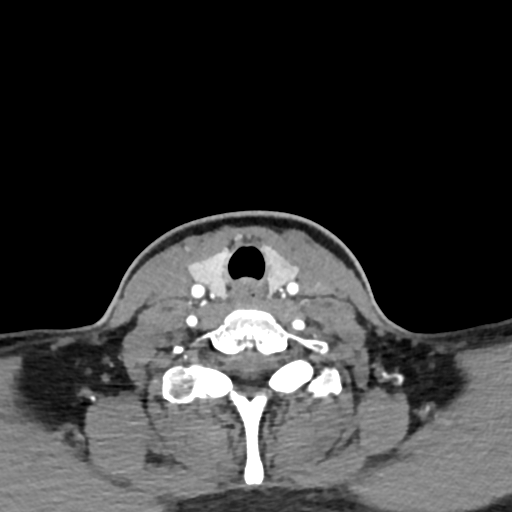
[im 234/701  soft-tissue]
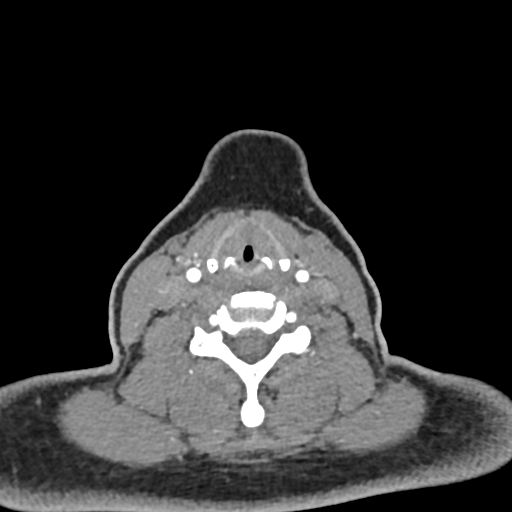
[im 281/701  soft-tissue]
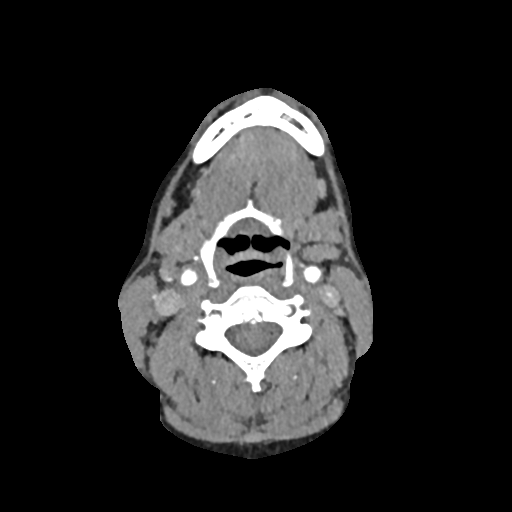
[im 374/701  soft-tissue]
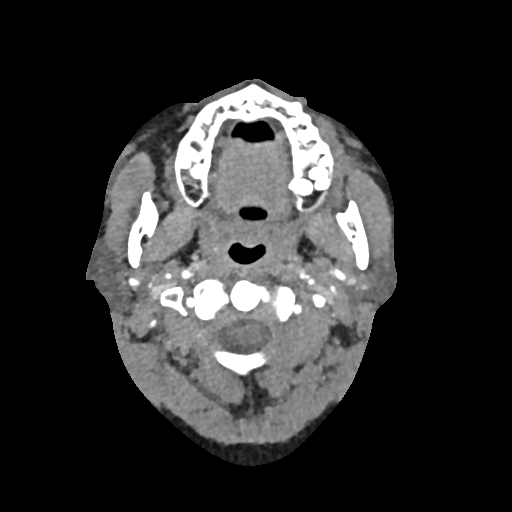
[im 421/701  soft-tissue]
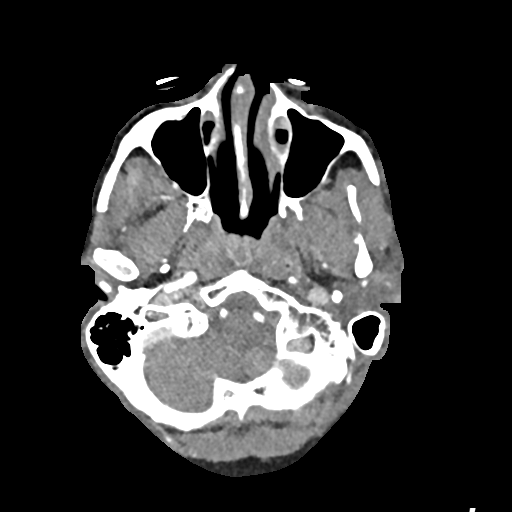
[im 467/701  soft-tissue]
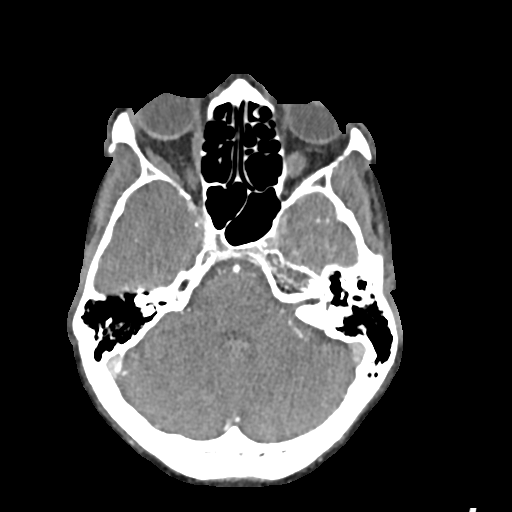
[im 514/701  soft-tissue]
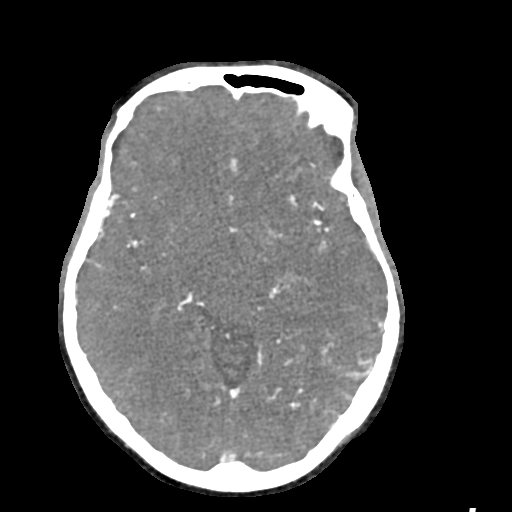
[im 514/701  lung]
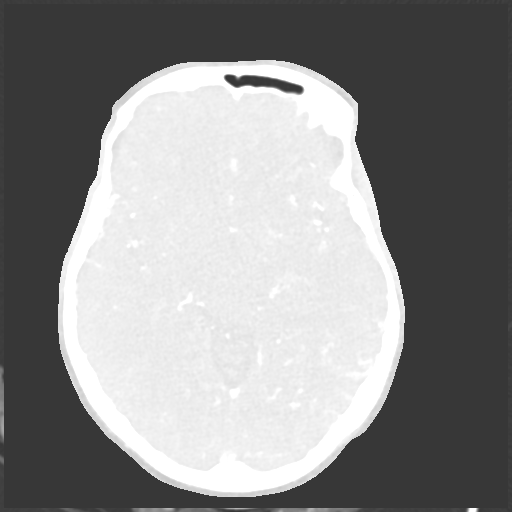
[im 514/701  bone]
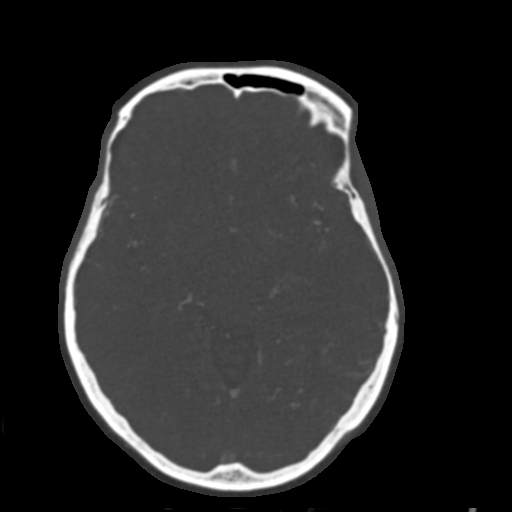
[im 561/701  lung]
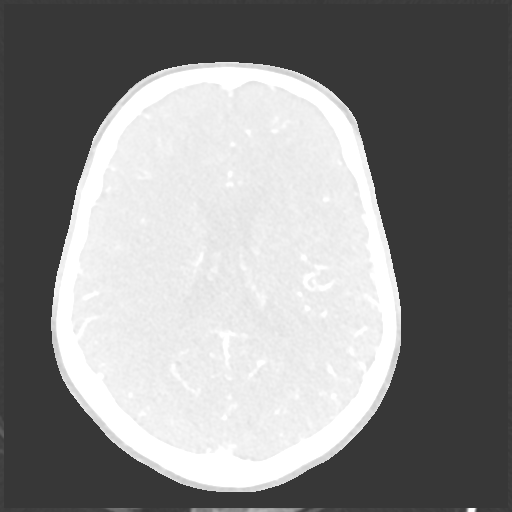
[im 607/701  soft-tissue]
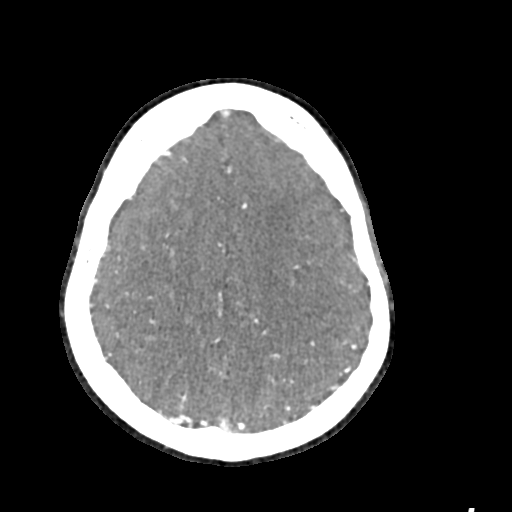
[im 607/701  lung]
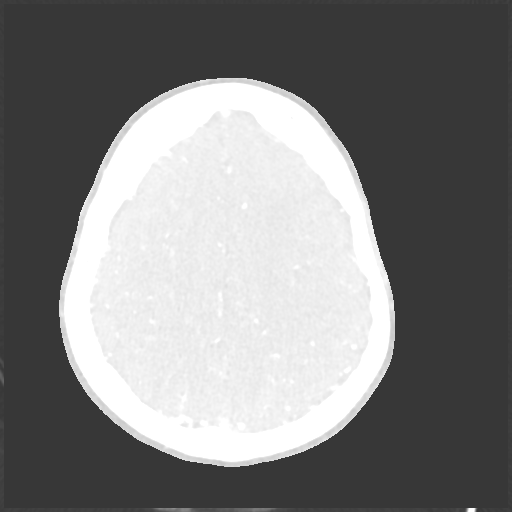
[im 654/701  soft-tissue]
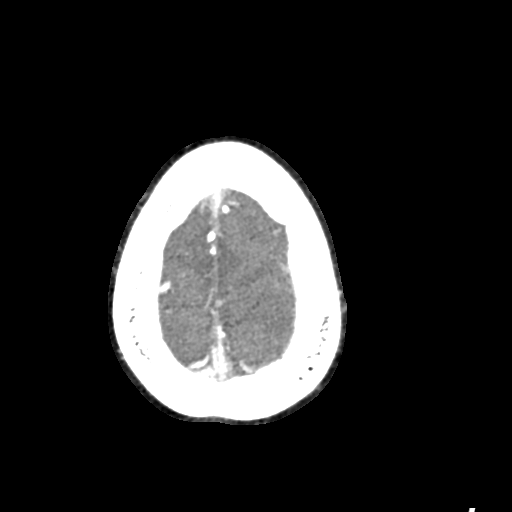
[im 654/701  lung]
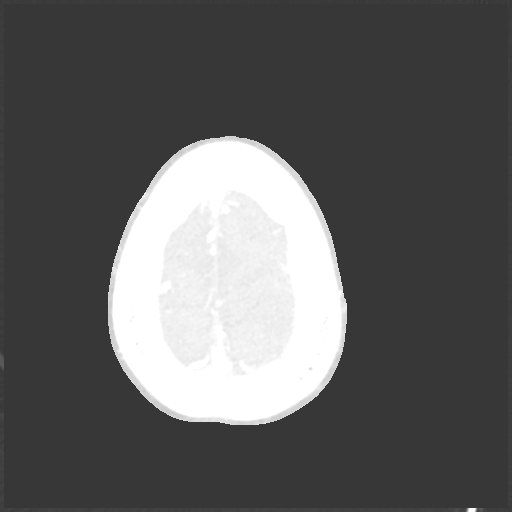

[12 of 46 positions shown; findings below may reference images not displayed]

FINDINGS: CT HEAD FINDINGS

Brain: No acute large territory infarct, acute intracranial
hemorrhage, mass, midline shift, or extra-axial fluid collection is
identified. The ventricles are normal in size. Hypodensities in the
cerebral white matter bilaterally are nonspecific but compatible
with moderately age advanced chronic small vessel ischemic disease.
There is a small chronic left frontal infarct which was subacute on
the prior studies. There is also a chronic left basal ganglia
infarct. Subtle hypodensities in the right ACA territory correspond
to the small acute infarcts on the prior MRI.

Vascular: No hyperdense vessel.

Skull: No fracture suspicious osseous lesion.

Sinuses: Visualized paranasal sinuses and mastoid air cells are
clear.

Orbits: Unremarkable.

Review of the MIP images confirms the above findings

CTA NECK FINDINGS

Aortic arch: Standard 3 vessel aortic arch with widely patent arch
vessel origins.

Right carotid system: Patent with unchanged minimal low-density wall
thickening at the carotid bifurcation. No evidence of dissection or
stenosis.

Left carotid system: Widely patent common carotid artery. Patent but
diffusely small cervical ICA beginning at the distal aspect of the
bulb without superimposed focal stenosis in the neck, unchanged.

Vertebral arteries: Patent and codominant without evidence of
dissection or stenosis.

Skeleton: No acute osseous abnormality or suspicious osseous lesion.

Other neck: Mildly prominence, heterogeneously enhancing level IIA
lymph nodes are stable to minimally larger than on the prior CTA,
measuring up to 8 mm in short axis on the right and 11 mm on the
left. No new or progressive lymphadenopathy is evident elsewhere in
the neck.

Upper chest: Clear lung apices.

Review of the MIP images confirms the above findings

CTA HEAD FINDINGS

Anterior circulation: The intracranial right ICA is patent with
moderate supraclinoid narrowing and a moderate to severe stenosis in
the region of the ICA terminus which may have mildly progressed from
the prior CTA. The right MCA is patent with a moderate proximal M1
stenosis. The left ICA is patent from the petrous through cavernous
segments but remains occluded beginning just distal to the
ophthalmic artery origin through the terminus. The left MCA and both
ACAs again appear occluded at their origins with multiple small
regional collateral vessels and with similar appearance of distal
reconstitution. No aneurysm is identified.

Posterior circulation: The intracranial vertebral arteries are
widely patent to the basilar. Patent PICA, AICA, and SCA origins are
identified bilaterally. The basilar artery is widely patent. There
is a diminutive left posterior communicating artery. Both PCAs are
patent without evidence of a significant proximal stenosis. No
aneurysm is identified.

Venous sinuses: As permitted by contrast timing, patent.

Anatomic variants: None.

Review of the MIP images confirms the above findings
IMPRESSION: 1. Unchanged occlusion of the distal left ICA, proximal left MCA,
and proximal ACAs with distal reconstitution via collaterals.
2. Moderate to severe stenosis of the right ICA terminus, possibly
mildly progressed from the prior CTA.
3. Patent cervical carotid and vertebral arteries without
significant focal stenosis.
4. Moderately age advanced chronic small vessel ischemic disease
with old infarcts as above.
5. Unchanged to minimally increased size of nonspecific bilateral
level II lymph nodes.

## 2020-05-31 MED ORDER — IOHEXOL 350 MG/ML SOLN
50.0000 mL | Freq: Once | INTRAVENOUS | Status: AC | PRN
  Administered 2020-05-31: 50 mL via INTRAVENOUS

## 2020-05-31 NOTE — Telephone Encounter (Signed)
I called the pt back. She said her daughter had called because she thought her CT scan would be here but it's at [facility]. She verbalized appreciation for the call back.

## 2020-06-01 ENCOUNTER — Telehealth: Payer: Self-pay | Admitting: Neurology

## 2020-06-01 ENCOUNTER — Ambulatory Visit (HOSPITAL_COMMUNITY): Payer: BC Managed Care – PPO

## 2020-06-01 NOTE — Telephone Encounter (Signed)
EEG study was normal, this is a patient of Dr. Leonie Man, I will send the results to him   EEG 05/31/20:  Summary  Normal electroencephalogram, awake, asleep and with activation procedures. There are no focal lateralizing or epileptiform features.

## 2020-06-01 NOTE — Telephone Encounter (Signed)
Kindly inform the patient that EEG study was normal

## 2020-06-01 NOTE — Telephone Encounter (Signed)
Pt verified by name and DOB,  normal results given per provider, pt voiced understanding all question answered. °

## 2020-06-03 ENCOUNTER — Other Ambulatory Visit: Payer: Self-pay | Admitting: Neurology

## 2020-06-05 ENCOUNTER — Telehealth (HOSPITAL_COMMUNITY): Payer: Self-pay

## 2020-06-05 NOTE — Telephone Encounter (Signed)
Pt agreed to f/u in 6 months with cta head/neck. AW 

## 2020-06-15 ENCOUNTER — Other Ambulatory Visit: Payer: Self-pay

## 2020-06-15 ENCOUNTER — Encounter: Payer: Self-pay | Admitting: Neurology

## 2020-06-15 ENCOUNTER — Ambulatory Visit (INDEPENDENT_AMBULATORY_CARE_PROVIDER_SITE_OTHER): Payer: BC Managed Care – PPO | Admitting: Neurology

## 2020-06-15 VITALS — BP 110/72 | HR 50 | Ht 66.0 in | Wt 154.0 lb

## 2020-06-15 DIAGNOSIS — E7521 Fabry (-Anderson) disease: Secondary | ICD-10-CM

## 2020-06-15 DIAGNOSIS — I675 Moyamoya disease: Secondary | ICD-10-CM | POA: Diagnosis not present

## 2020-06-15 DIAGNOSIS — I6990 Unspecified sequelae of unspecified cerebrovascular disease: Secondary | ICD-10-CM

## 2020-06-15 NOTE — Progress Notes (Signed)
[facility] [address] [place]. [place] [address] [phone]       OFFICE FOLLOW UP VISITNOTE  [name] Date of Birth:  1971-07-26 Medical Record Number:  [id number]   Referring MD:  Rosalin Hawking  Reason for Referral: Stroke  HPI: Initial visit 03/21/2020;Jacqueline Medina is a pleasant 49 year old African-American lady seen today for initial office consultation visit.  She comes accompanied by her daughter.  History is obtained from them, review of electronic medical records and I personally reviewed imaging films in PACS.  She has no significant past medical history except hypertension hyperlipidemia and anxiety.  She presented on 02/05/2020 with weakness in the right hand with starting to drop objects and having numbness for a couple of days prior to presentation.  NIH stroke scale on admission was 0.  She had mild weakness of the right grip and hand muscles.  CT scan of the head was unremarkable but CT angiogram showed left supraclinoid ICA occlusion with reconstituted collateral supply the left anterior middle cerebral territories.  The distal right ICA was abnormally small but was patent and supplying the right MCA.  There was occlusion to severe stenosis of the right anterior cerebral artery.  MRI scan of the brain showed multiple small acute infarcts involving right ACA and left MCA territory in the precentral gyrus.  There was also a subacute small left frontal infarct noted.  There are extensive changes of chronic small vessel disease.  Patient underwent diagnostic cerebral catheter angiogram by Dr. Estanislado Pandy on 02/08/2020 which confirmed occluded left ICA terminus and 50 to 60% stenosis of the right ICA M1 segment and both posterior cerebral arteries were prominent.  2D echo showed normal ejection fraction without cardiac source embolism.  LDL cholesterol was marginally elevated at 107 mg percent and hemoglobin A1c was 5.5.  HIV was negative.  ESR was normal  C-reactive protein was elevated at 3.7.  ANA, antineutrophilic cytoplasmic antibodies, antidsDNA, Sjogren's antibodies and rheumatoid factor were all negative.  HIV was negative.  Patient underwent alpha galactosidase testing which came low at 25.7 within normal limits being greater than 35.  The patient however lacks other clinical symptoms of Fabry's disease and denies painful acroparesthesias, skin lesions or kidney problems.  She does not have any cataracts or vision problems either.  Interestingly the patient has a sister who is older than her Jacqueline Medina dob 01/02/62) who also had significant occlusive intracranial disease and Dr. Estanislado Pandy did angioplasty stenting in 2013.  Patient states she is done well since discharge.  She was placed on aspirin and Plavix which is tolerating well with only minor bruising and no bleeding episodes.  She has no recurrence of TIA or stroke symptoms.  She states her blood pressures well controlled today it is 127/83.  She is tolerating Lipitor 80 mg well without any significant muscle aches or joint pains.  She works at [employer].  Return back to baseline.  No complaints or deficits. Update 06/15/2020: She returns for follow-up after last visit 3 months ago.  She is accompanied by her husband.  She is doing well.  She is had no recurrent TIA or stroke symptoms.  She has stopped aspirin and is now on Plavix alone which is tolerating well without bruising or bleeding.  She had lab work done at last visit and LDL cholesterol was 86 and improved from previous.  She had follow-up CT angiogram ordered by Dr. Estanislado Pandy 06/01/2020 which showed unchanged occlusion of the distal left ICA,  proximal left MCA and proximal ACAs with distal reconstitution via collaterals.  There is moderate to severe stenosis of the right ICA terminus possibly mildly progressed compared to prior CTA.  Dr. Estanislado Pandy recommended conservative medical treatment for now.  Patient states blood pressures well  controlled today it is 110/72.  She remains on Lipitor which is tolerating well without muscle aches and pains.  She has no new complaints. ROS:   14 system review of systems is positive for hand weakness, decreased dexterity, numbness and all other systems negative  PMH:  Past Medical History:  Diagnosis Date  . Hypertension   . Stroke [facility])     Social History:  Social History   Socioeconomic History  . Marital status: Single    Spouse name: Not on file  . Number of children: 2  . Years of education: Not on file  . Highest education level: Not on file  Occupational History    Comment: works at [employer]  . Smoking status: Never Smoker  . Smokeless tobacco: Never Used  Substance and Sexual Activity  . Alcohol use: Not Currently  . Drug use: Never  . Sexual activity: Not on file  Other Topics Concern  . Not on file  Social History Narrative   Jacqueline Medina is a 49 year old patient who works full time at [employer] 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 She lives with and has support of her daughters (13 & 47). She is independent with care needs and denies issues with transportation to medical appointments    Right handed   Social Determinants of Health   Financial Resource Strain:   . Difficulty of Paying Living Expenses: Not on file  Food Insecurity:   . Worried About Charity fundraiser in the Last Year: Not on file  . Ran Out of Food in the Last Year: Not on file  Transportation Needs: No Transportation Needs  . Lack of Transportation (Medical): No  . Lack of Transportation (Non-Medical): No  Physical Activity:   . Days of Exercise per Week: Not on file  . Minutes of Exercise per Session: Not on file  Stress:   . Feeling of Stress : Not on file  Social Connections:   . Frequency of Communication with Friends and Family: Not on file  . Frequency of Social Gatherings with Friends and Family: Not on file  . Attends Religious  Services: Not on file  . Active Member of Clubs or Organizations: Not on file  . Attends Archivist Meetings: Not on file  . Marital Status: Not on file  Intimate Partner Violence:   . Fear of Current or Ex-Partner: Not on file  . Emotionally Abused: Not on file  . Physically Abused: Not on file  . Sexually Abused: Not on file    Medications:   Current Outpatient Medications on File Prior to Visit  Medication Sig Dispense Refill  . amLODipine (NORVASC) 5 MG tablet Take 1 tablet (5 mg total) by mouth at bedtime. 90 tablet 2  . busPIRone (BUSPAR) 5 MG tablet Take 5 mg by mouth in the morning and at bedtime.    . clopidogrel (PLAVIX) 75 MG tablet TAKE 1 TABLET BY MOUTH  DAILY 30 tablet 0  . lisinopril (ZESTRIL) 20 MG tablet Take 1 tablet (20 mg total) by mouth daily. 90 tablet 2  . metoprolol tartrate (LOPRESSOR) 50 MG tablet Take 1 tablet (50 mg total) by  mouth 2 (two) times daily. 180 tablet 2  . Multiple Vitamins-Minerals (MULTIVITAMIN GUMMIES ADULT PO) Take 2 tablets by mouth daily. 2 gummies daily    . atorvastatin (LIPITOR) 80 MG tablet Take 1 tablet (80 mg total) by mouth daily. 90 tablet 0   No current facility-administered medications on file prior to visit.    Allergies:  No Known Allergies  Physical Exam General: well developed, well nourished middle-aged African-American lady, seated, in no evident distress Head: head normocephalic and atraumatic.   Neck: supple with no carotid or supraclavicular bruits Cardiovascular: regular rate and rhythm, no murmurs Musculoskeletal: no deformity Skin:  no rash/petichiae Vascular:  Normal pulses all extremities  Neurologic Exam Mental Status: Awake and fully alert. Oriented to place and time. Recent and remote memory intact. Attention span, concentration and fund of knowledge appropriate. Mood and affect appropriate.  Cranial Nerves: Fundoscopic exam reveals sharp disc margins. Pupils equal, briskly reactive to light.  Extraocular movements full without nystagmus. Visual fields full to confrontation. Hearing intact. Facial sensation intact. Face, tongue, palate moves normally and symmetrically.  Motor: Normal bulk and tone. Normal strength in all tested extremity muscles. Sensory.: intact to touch , pinprick , position and vibratory sensation.  Coordination: Rapid alternating movements normal in all extremities. Finger-to-nose and heel-to-shin performed accurately bilaterally. Gait and Station: Arises from chair without difficulty. Stance is normal. Gait demonstrates normal stride length and balance . Able to heel, toe and tandem walk without difficulty.  Reflexes: 1+ and symmetric. Toes downgoing.   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  She lacks skin lesions, acroparesthesias and others clinical symptoms of Fabry's.  Fabry's typically affects small vessels and not large vessels in the brain also.  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 Estanislado Pandy.  She remained stable from neurological standpoint and follow-up CT angiogram on 06/01/2020 shows no major progression     PLAN: I had a long discussion the patient and her husband regarding her recent admission for stroke and cerebral angiogram findings of multifocal stenosis and lab results of slight deficiency of alpha galactosidase.  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  I am not sure specific treatment is necessary but interestingly her sister also has cerebrovascular  disease at young age but large vessel stenosis making familial moyamoya more likely and it would be unusual in Fabry's disease which typically involves only  small sized blood vessels.  I recommend she stay on   Plavix   for secondary stroke prevention and maintain aggressive risk factor modification with strict control of hypertension with blood pressure goal below 130/90, lipids with LDL cholesterol goal below 70 mg percent and diabetes with hemoglobin A1c goal below 6.5%.  She was encouraged to eat a healthy diet and to be active and exercise regularly.  She will return for follow-up in 6 months or call earlier if necessary.Greater than 50% time during this 30 minute visit was performed on counseling and coordination of care about her strokes and discussion about moyamoya and Fabry's disease . Antony Contras, MD  [facility] [address] [place], [place] [address]  Phone [phone] Fax [phone] Note: This document was prepared with digital dictation and possible smart phrase technology. Any transcriptional errors that result from this process are unintentional.

## 2020-06-15 NOTE — Patient Instructions (Addendum)
I had a long discussion the patient and her husband regarding her recent admission for stroke and cerebral angiogram findings of multifocal stenosis and lab results of slight deficiency of alpha galactosidase.  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  I am not sure specific treatment is necessary but interestingly her sister also has cerebrovascular disease at young age but large vessel stenosis making familial moyamoya more likely and it would be unusual in Fabry's disease which typically involves only  small sized blood vessels.  I recommend she stay on   Plavix   for secondary stroke prevention and maintain aggressive risk factor modification with strict control of hypertension with blood pressure goal below 130/90, lipids with LDL cholesterol goal below 70 mg percent and diabetes with hemoglobin A1c goal below 6.5%.  She was encouraged to eat a healthy diet and to be active and exercise regularly.  She will return for follow-up in 6 months or call earlier if necessary.

## 2020-07-12 ENCOUNTER — Telehealth: Payer: Self-pay | Admitting: Family Medicine

## 2020-07-12 DIAGNOSIS — Z1231 Encounter for screening mammogram for malignant neoplasm of breast: Secondary | ICD-10-CM

## 2020-07-12 NOTE — Telephone Encounter (Signed)
Okay for order?

## 2020-07-12 NOTE — Telephone Encounter (Signed)
Pt is calling in stating that she needs a order to have a mammogram done at [facility]) [phone]) sometime in November 2021.

## 2020-07-18 NOTE — Addendum Note (Signed)
Addended by: Nathanial Millman E on: 07/18/2020 11:26 AM   Modules accepted: Orders

## 2020-07-18 NOTE — Telephone Encounter (Signed)
Order faxed.

## 2020-07-18 NOTE — Telephone Encounter (Signed)
It is ok to place order for mammogram as requested. Thanks, BJ

## 2020-08-04 ENCOUNTER — Other Ambulatory Visit: Payer: Self-pay

## 2020-08-04 ENCOUNTER — Other Ambulatory Visit: Payer: Self-pay | Admitting: Neurology

## 2020-08-04 MED ORDER — ATORVASTATIN CALCIUM 80 MG PO TABS: 80.0000 mg | ORAL_TABLET | Freq: Every day | ORAL | 1 refills | Status: DC

## 2020-08-04 NOTE — Telephone Encounter (Signed)
Pt request refill clopidogrel (PLAVIX) 75 MG tablet at [facility]

## 2020-08-07 MED ORDER — CLOPIDOGREL BISULFATE 75 MG PO TABS
75.0000 mg | ORAL_TABLET | Freq: Every day | ORAL | 5 refills | Status: DC
Start: 2020-08-07 — End: 2021-03-06

## 2020-08-07 NOTE — Telephone Encounter (Signed)
Plavix refilled x 6 months. She has FU in March 2022, Rx sent to [facility] as requested.

## 2020-08-07 NOTE — Telephone Encounter (Signed)
Pt. is requesting prescription be sent to [facility].   Best contact number: [phone]

## 2020-08-07 NOTE — Addendum Note (Signed)
Addended by: Minna Antis on: 08/07/2020 01:26 PM   Modules accepted: Orders

## 2020-08-15 ENCOUNTER — Other Ambulatory Visit: Payer: Self-pay

## 2020-08-15 ENCOUNTER — Encounter: Payer: Self-pay | Admitting: Family Medicine

## 2020-08-15 ENCOUNTER — Ambulatory Visit (INDEPENDENT_AMBULATORY_CARE_PROVIDER_SITE_OTHER): Payer: BC Managed Care – PPO | Admitting: Family Medicine

## 2020-08-15 VITALS — BP 106/60 | HR 56 | Temp 98.2°F | Resp 16 | Ht 66.0 in | Wt 151.8 lb

## 2020-08-15 DIAGNOSIS — E785 Hyperlipidemia, unspecified: Secondary | ICD-10-CM | POA: Diagnosis not present

## 2020-08-15 DIAGNOSIS — Z1159 Encounter for screening for other viral diseases: Secondary | ICD-10-CM

## 2020-08-15 DIAGNOSIS — E875 Hyperkalemia: Secondary | ICD-10-CM

## 2020-08-15 DIAGNOSIS — Z0000 Encounter for general adult medical examination without abnormal findings: Secondary | ICD-10-CM

## 2020-08-15 DIAGNOSIS — I10 Essential (primary) hypertension: Secondary | ICD-10-CM

## 2020-08-15 DIAGNOSIS — Z131 Encounter for screening for diabetes mellitus: Secondary | ICD-10-CM

## 2020-08-15 MED ORDER — METOPROLOL TARTRATE 50 MG PO TABS: 25.0000 mg | ORAL_TABLET | Freq: Two times a day (BID) | ORAL | 0 refills | Status: DC

## 2020-08-15 NOTE — Progress Notes (Signed)
HPI: Jacqueline Medina is a 49 y.o. female, who is here today for her routine physical.  Last CPE: 06/2015.  Regular exercise 3 or more time per week: She has no time to do so. Following a healthy diet: Yes She lives with her daughter and granddaughter.  Chronic medical problems: HTN,HLD,CVA,Fabry disease,and anxiety among some. CVA in 01/2020, no significant residual deficit. She follows with neurologist.  Pap smear: 07/2019  Immunization History  Administered Date(s) Administered  . DTaP 05/06/2012  . Tdap 05/06/2012   Mammogram: 08/12/2020. Colonoscopy: 11/29/2018. DEXA: N/A Hep C screening: Never.  She has no concerns today.  HLD: She is on Atorvastatin 80 mg daily.  Lab Results  Component Value Date   CHOL 149 03/21/2020   HDL 36 (L) 03/21/2020   LDLCALC 86 03/21/2020   TRIG 154 (H) 03/21/2020   CHOLHDL 4.1 03/21/2020   HTN: Having low BP's at home sometimes,so she skip Metoprolol. She is on Amlodipine 5 mg daily,Metoprolol 50 mg bid,and Lisinopril 20 mg daily. Mild peri ankle edema with prolonged standing/walking on concrete at work.She has not noted erythema or pain.  Lab Results  Component Value Date   CREATININE 0.89 02/28/2020   BUN 15 02/28/2020   NA 143 02/28/2020   K 4.2 02/28/2020   CL 107 02/28/2020   CO2 30 02/28/2020   Review of Systems  Constitutional: Positive for fatigue. Negative for activity change, appetite change and fever.  HENT: Negative for hearing loss, mouth sores, nosebleeds and sore throat.   Eyes: Negative for redness and visual disturbance.  Respiratory: Negative for cough, shortness of breath and wheezing.   Cardiovascular: Positive for leg swelling. Negative for chest pain and palpitations.  Gastrointestinal: Negative for abdominal pain, nausea and vomiting.       Negative for changes in bowel habits.  Endocrine: Negative for cold intolerance, heat intolerance, polydipsia, polyphagia and polyuria.  Genitourinary:  Negative for decreased urine volume, dysuria, hematuria, vaginal bleeding and vaginal discharge.  Musculoskeletal: Negative for gait problem and myalgias.  Skin: Negative for color change and rash.  Allergic/Immunologic: Negative for environmental allergies.  Neurological: Negative for syncope, weakness and headaches.  Hematological: Negative for adenopathy. Does not bruise/bleed easily.  Psychiatric/Behavioral: Negative for behavioral problems and confusion.  All other systems reviewed and are negative.  Current Outpatient Medications on File Prior to Visit  Medication Sig Dispense Refill  . amLODipine (NORVASC) 5 MG tablet Take 1 tablet (5 mg total) by mouth at bedtime. 90 tablet 2  . atorvastatin (LIPITOR) 80 MG tablet Take 1 tablet (80 mg total) by mouth daily. 90 tablet 1  . busPIRone (BUSPAR) 5 MG tablet Take 5 mg by mouth in the morning and at bedtime.    . clopidogrel (PLAVIX) 75 MG tablet Take 1 tablet (75 mg total) by mouth daily. 30 tablet 5  . lisinopril (ZESTRIL) 20 MG tablet Take 1 tablet (20 mg total) by mouth daily. 90 tablet 2  . Multiple Vitamins-Minerals (MULTIVITAMIN GUMMIES ADULT PO) Take 2 tablets by mouth daily. 2 gummies daily     No current facility-administered medications on file prior to visit.   Past Medical History:  Diagnosis Date  . Hypertension   . Stroke [facility])     Past Surgical History:  Procedure Laterality Date  . CESAREAN SECTION  1992  . IR ANGIO INTRA EXTRACRAN SEL COM CAROTID INNOMINATE BILAT MOD SED  02/08/2020  . IR ANGIO VERTEBRAL SEL VERTEBRAL BILAT MOD SED  02/08/2020  . IR  US GUIDE VASC ACCESS RIGHT  02/08/2020    No Known Allergies  Family History  Problem Relation Age of Onset  . Diabetes Father   . Stroke Sister   . Stroke Other     Social History   Socioeconomic History  . Marital status: Single    Spouse name: Not on file  . Number of children: 2  . Years of education: Not on file  . Highest education level: Not on file   Occupational History    Comment: works at [employer]  . Smoking status: Never Smoker  . Smokeless tobacco: Never Used  Substance and Sexual Activity  . Alcohol use: Not Currently  . Drug use: Never  . Sexual activity: Not on file  Other Topics Concern  . Not on file  Social History Narrative   Jacqueline Medina is a 49 year old patient who works full time at [employer] 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 She lives with and has support of her daughters (28 & 55). She is independent with care needs and denies issues with transportation to medical appointments    Right handed   Social Determinants of Health   Financial Resource Strain:   . Difficulty of Paying Living Expenses: Not on file  Food Insecurity:   . Worried About Charity fundraiser in the Last Year: Not on file  . Ran Out of Food in the Last Year: Not on file  Transportation Needs: No Transportation Needs  . Lack of Transportation (Medical): No  . Lack of Transportation (Non-Medical): No  Physical Activity:   . Days of Exercise per Week: Not on file  . Minutes of Exercise per Session: Not on file  Stress:   . Feeling of Stress : Not on file  Social Connections:   . Frequency of Communication with Friends and Family: Not on file  . Frequency of Social Gatherings with Friends and Family: Not on file  . Attends Religious Services: Not on file  . Active Member of Clubs or Organizations: Not on file  . Attends Archivist Meetings: Not on file  . Marital Status: Not on file   Vitals:   08/15/20 0838  BP: 106/60  Pulse: (!) 56  Resp: 16  Temp: 98.2 F (36.8 C)  SpO2: 99%   Body mass index is 24.5 kg/m.  Wt Readings from Last 3 Encounters:  08/15/20 151 lb 12.8 oz (68.9 kg)  06/15/20 154 lb (69.9 kg)  03/21/20 155 lb (70.3 kg)   Physical Exam Vitals and nursing note reviewed.  Constitutional:      General: She is not in acute distress.    Appearance: She is  well-developed and normal weight.  HENT:     Head: Normocephalic and atraumatic.     Right Ear: Hearing, tympanic membrane, ear canal and external ear normal.     Left Ear: Hearing, tympanic membrane, ear canal and external ear normal.     Mouth/Throat:     Mouth: Mucous membranes are moist.     Pharynx: Oropharynx is clear. Uvula midline.  Eyes:     Extraocular Movements: Extraocular movements intact.     Conjunctiva/sclera: Conjunctivae normal.     Pupils: Pupils are equal, round, and reactive to light.  Neck:     Thyroid: No thyromegaly.     Trachea: No tracheal deviation.  Cardiovascular:     Rate and Rhythm: Regular rhythm. Bradycardia present.  Pulses:          Dorsalis pedis pulses are 2+ on the right side and 2+ on the left side.     Heart sounds: No murmur heard.   Pulmonary:     Effort: Pulmonary effort is normal. No respiratory distress.     Breath sounds: Normal breath sounds.  Abdominal:     Palpations: Abdomen is soft. There is no hepatomegaly or mass.     Tenderness: There is no abdominal tenderness.  Genitourinary:    Comments: Deferred to gyn. Musculoskeletal:     Comments: No signs of synovitis appreciated.  Lymphadenopathy:     Cervical: No cervical adenopathy.     Upper Body:     Right upper body: No supraclavicular adenopathy.     Left upper body: No supraclavicular adenopathy.  Skin:    General: Skin is warm.     Findings: No erythema or rash.  Neurological:     General: No focal deficit present.     Mental Status: She is alert and oriented to person, place, and time.     Cranial Nerves: No cranial nerve deficit.     Coordination: Coordination normal.     Gait: Gait normal.     Deep Tendon Reflexes:     Reflex Scores:      Bicep reflexes are 2+ on the right side and 2+ on the left side.      Patellar reflexes are 2+ on the right side and 2+ on the left side. Psychiatric:     Comments: Well groomed, good eye contact.   ASSESSMENT AND  PLAN:  Ms. Cyndia Degraff was here today annual physical examination.  Orders Placed This Encounter  Procedures  . COMPLETE METABOLIC PANEL WITH GFR  . Lipid panel  . Hepatitis C antibody   Lab Results  Component Value Date   CREATININE 0.96 08/15/2020   BUN 12 08/15/2020   NA 140 08/15/2020   K 5.6 (H) 08/15/2020   CL 105 08/15/2020   CO2 26 08/15/2020   Lab Results  Component Value Date   ALT 39 (H) 08/15/2020   AST 27 08/15/2020   ALKPHOS 84 02/05/2020   BILITOT 1.4 (H) 08/15/2020   Lab Results  Component Value Date   CHOL 127 08/15/2020   HDL 52 08/15/2020   LDLCALC 59 08/15/2020   TRIG 82 08/15/2020   CHOLHDL 2.4 08/15/2020    Routine general medical examination at a health care facility We discussed the importance of regular physical activity and healthy diet for prevention of chronic illness and/or complications. Preventive guidelines reviewed. Vaccination up to date. Declined flu vaccine. Ca++ and vit D supplementation recommended. Next CPE in a year.  Hyperlipidemia, unspecified hyperlipidemia type Continue Atorvastatin 80 mg daily.  Essential hypertension BP on lower normal range, reporting low BP's at home + bradycardia; so recommend decreasing dose of Metoprolol tartrate from 50 mg bid to 25 mg bid. No changes in Lisinopril or Amlodipine.  -     metoprolol tartrate (LOPRESSOR) 50 MG tablet; Take 0.5 tablets (25 mg total) by mouth 2 (two) times daily.  Encounter for HCV screening test for low risk patient -     Hepatitis C antibody  Diabetes mellitus screening -     COMPLETE METABOLIC PANEL WITH GFR   Return in 3 months (on 11/15/2020) for HTN.  Alyanna Stoermer G. [name], MD  [facility]. [place] office.   Today you have you routine preventive visit. A few things to  remember from today's visit:   Routine general medical examination at a health care facility  Hyperlipidemia, unspecified hyperlipidemia type - Plan: Lipid  panel  Essential hypertension - Plan: metoprolol tartrate (LOPRESSOR) 50 MG tablet  Encounter for HCV screening test for low risk patient - Plan: Hepatitis C antibody  Diabetes mellitus screening - Plan: COMPLETE METABOLIC PANEL WITH GFR  If you need refills please call your pharmacy. Do not use My Chart to request refills or for acute issues that need immediate attention.   Today we decreased Metoprolol dose from 50 mg to 25 mg 2 times per day. Continue monitoring blood pressure and heart rate.  Please be sure medication list is accurate. If a new problem present, please set up appointment sooner than planned today.  At least 150 minutes of moderate exercise per week, daily brisk walking for 15-30 min is a good exercise option. Healthy diet low in saturated (animal) fats and sweets and consisting of fresh fruits and vegetables, lean meats such as fish and white chicken and whole grains.  These are some of recommendations for screening depending of age and risk factors:  - Vaccines:  Tdap vaccine every 10 years.  Shingles vaccine recommended at age 50, could be given after 51 years of age but not sure about insurance coverage.   Pneumonia vaccines: Pneumovax at 58. Sometimes Pneumovax is giving earlier if history of smoking, lung disease,diabetes,kidney disease among some.  Screening for diabetes at age 83 and every 3 years.  Cervical cancer prevention:  Pap smear starts at 51 years of age and continues periodically until 51 years old in low risk women. Pap smear every 3 years between 42 and 98 years old. Pap smear every 3-5 years between women 65 and older if pap smear negative and HPV screening negative.   -Breast cancer: Mammogram: There is disagreement between experts about when to start screening in low risk asymptomatic female but recent recommendations are to start screening at 70 and not later than 51 years old , every 1-2 years and after 51 yo q 2 years. Screening is  recommended until 51 years old but some women can continue screening depending of healthy issues.  Colon cancer screening: Has been recently changed to 51 yo. Insurance may not cover until you are 51 years old. Screening is recommended until 51 years old.  Cholesterol disorder screening at age 31 and every 3 years. N/A  Also recommended:  1. Dental visit- Brush and floss your teeth twice daily; visit your dentist twice a year. 2. Eye doctor- Get an eye exam at least every 2 years. 3. Helmet use- Always wear a helmet when riding a bicycle, motorcycle, rollerblading or skateboarding. 4. Safe sex- If you may be exposed to sexually transmitted infections, use a condom. 5. Seat belts- Seat belts can save your live; always wear one. 6. Smoke/Carbon Monoxide detectors- These detectors need to be installed on the appropriate level of your home. Replace batteries at least once a year. 7. Skin cancer- When out in the sun please cover up and use sunscreen 15 SPF or higher. 8. Violence- If anyone is threatening or hurting you, please tell your healthcare provider.  9. Drink alcohol in moderation- Limit alcohol intake to one drink or less per day. Never drink and drive. 10. Calcium supplementation 1000 to 1200 mg daily, ideally through your diet.  Vitamin D supplementation 800 units daily.

## 2020-08-15 NOTE — Patient Instructions (Addendum)
Today you have you routine preventive visit. A few things to remember from today's visit:   Routine general medical examination at a health care facility  Hyperlipidemia, unspecified hyperlipidemia type - Plan: Lipid panel  Essential hypertension - Plan: metoprolol tartrate (LOPRESSOR) 50 MG tablet  Encounter for HCV screening test for low risk patient - Plan: Hepatitis C antibody  Diabetes mellitus screening - Plan: COMPLETE METABOLIC PANEL WITH GFR  If you need refills please call your pharmacy. Do not use My Chart to request refills or for acute issues that need immediate attention.   Today we decreased Metoprolol dose from 50 mg to 25 mg 2 times per day. Continue monitoring blood pressure and heart rate.  Please be sure medication list is accurate. If a new problem present, please set up appointment sooner than planned today.  At least 150 minutes of moderate exercise per week, daily brisk walking for 15-30 min is a good exercise option. Healthy diet low in saturated (animal) fats and sweets and consisting of fresh fruits and vegetables, lean meats such as fish and white chicken and whole grains.  These are some of recommendations for screening depending of age and risk factors:  - Vaccines:  Tdap vaccine every 10 years.  Shingles vaccine recommended at age 71, could be given after 51 years of age but not sure about insurance coverage.   Pneumonia vaccines: Pneumovax at 72. Sometimes Pneumovax is giving earlier if history of smoking, lung disease,diabetes,kidney disease among some.  Screening for diabetes at age 77 and every 3 years.  Cervical cancer prevention:  Pap smear starts at 52 years of age and continues periodically until 51 years old in low risk women. Pap smear every 3 years between 56 and 40 years old. Pap smear every 3-5 years between women 23 and older if pap smear negative and HPV screening negative.   -Breast cancer: Mammogram: There is disagreement  between experts about when to start screening in low risk asymptomatic female but recent recommendations are to start screening at 52 and not later than 51 years old , every 1-2 years and after 51 yo q 2 years. Screening is recommended until 51 years old but some women can continue screening depending of healthy issues.  Colon cancer screening: Has been recently changed to 51 yo. Insurance may not cover until you are 51 years old. Screening is recommended until 51 years old.  Cholesterol disorder screening at age 71 and every 3 years. N/A  Also recommended:  1. Dental visit- Brush and floss your teeth twice daily; visit your dentist twice a year. 2. Eye doctor- Get an eye exam at least every 2 years. 3. Helmet use- Always wear a helmet when riding a bicycle, motorcycle, rollerblading or skateboarding. 4. Safe sex- If you may be exposed to sexually transmitted infections, use a condom. 5. Seat belts- Seat belts can save your live; always wear one. 6. Smoke/Carbon Monoxide detectors- These detectors need to be installed on the appropriate level of your home. Replace batteries at least once a year. 7. Skin cancer- When out in the sun please cover up and use sunscreen 15 SPF or higher. 8. Violence- If anyone is threatening or hurting you, please tell your healthcare provider.  9. Drink alcohol in moderation- Limit alcohol intake to one drink or less per day. Never drink and drive. 10. Calcium supplementation 1000 to 1200 mg daily, ideally through your diet.  Vitamin D supplementation 800 units daily.

## 2020-08-18 LAB — LIPID PANEL
Cholesterol: 127 mg/dL (ref <200)
HDL: 52 mg/dL (ref >=50)
LDL Cholesterol (Calc): 59 mg/dL (calc)
Non-HDL Cholesterol (Calc): 75 mg/dL (calc) (ref <130)
Total CHOL/HDL Ratio: 2.4 (calc) (ref <5.0)
Triglycerides: 82 mg/dL (ref <150)

## 2020-08-18 LAB — COMPLETE METABOLIC PANEL WITH GFR
AG Ratio: 1.4 (calc) (ref 1.0–2.5)
ALT: 39 U/L — ABNORMAL HIGH (ref 6–29)
AST: 27 U/L (ref 10–35)
Albumin: 4.2 g/dL (ref 3.6–5.1)
Alkaline phosphatase (APISO): 192 U/L — ABNORMAL HIGH (ref 37–153)
BUN: 12 mg/dL (ref 7–25)
CO2: 26 mmol/L (ref 20–32)
Calcium: 9.9 mg/dL (ref 8.6–10.4)
Chloride: 105 mmol/L (ref 98–110)
Creat: 0.96 mg/dL (ref 0.50–1.05)
GFR, Est African American: 79 mL/min/{1.73_m2} (ref >=60)
GFR, Est Non African American: 68 mL/min/{1.73_m2} (ref >=60)
Globulin: 2.9 g/dL (calc) (ref 1.9–3.7)
Glucose, Bld: 93 mg/dL (ref 65–99)
Potassium: 5.6 mmol/L — ABNORMAL HIGH (ref 3.5–5.3)
Sodium: 140 mmol/L (ref 135–146)
Total Bilirubin: 1.4 mg/dL — ABNORMAL HIGH (ref 0.2–1.2)
Total Protein: 7.1 g/dL (ref 6.1–8.1)

## 2020-08-18 LAB — HEPATITIS C ANTIBODY
Hepatitis C Ab: NONREACTIVE
SIGNAL TO CUT-OFF: 0.02 (ref <1.00)

## 2020-08-20 ENCOUNTER — Encounter: Payer: Self-pay | Admitting: Family Medicine

## 2020-08-23 ENCOUNTER — Other Ambulatory Visit (INDEPENDENT_AMBULATORY_CARE_PROVIDER_SITE_OTHER): Payer: BC Managed Care – PPO

## 2020-08-23 ENCOUNTER — Other Ambulatory Visit: Payer: Self-pay

## 2020-08-23 DIAGNOSIS — E875 Hyperkalemia: Secondary | ICD-10-CM

## 2020-08-24 LAB — POTASSIUM: Potassium: 4.7 mmol/L (ref 3.5–5.3)

## 2020-10-09 ENCOUNTER — Ambulatory Visit: Payer: BC Managed Care – PPO | Admitting: Family Medicine

## 2020-11-15 ENCOUNTER — Ambulatory Visit: Payer: BC Managed Care – PPO | Admitting: Family Medicine

## 2020-11-15 ENCOUNTER — Encounter: Payer: Self-pay | Admitting: Family Medicine

## 2020-11-15 ENCOUNTER — Other Ambulatory Visit: Payer: Self-pay

## 2020-11-15 VITALS — BP 110/60 | HR 68 | Resp 16 | Ht 66.0 in | Wt 151.0 lb

## 2020-11-15 DIAGNOSIS — M2550 Pain in unspecified joint: Secondary | ICD-10-CM | POA: Diagnosis not present

## 2020-11-15 DIAGNOSIS — I10 Essential (primary) hypertension: Secondary | ICD-10-CM

## 2020-11-15 MED ORDER — METOPROLOL TARTRATE 25 MG PO TABS: 12.5000 mg | ORAL_TABLET | Freq: Two times a day (BID) | ORAL | 1 refills | Status: DC

## 2020-11-15 NOTE — Patient Instructions (Signed)
A few things to remember from today's visit:   Essential hypertension  Arthralgia, unspecified joint  If you need refills please call your pharmacy. Do not use My Chart to request refills or for acute issues that need immediate attention.   Today we decreased Metoprolol from 25 mg to 12.5 mg 2 times daily. Continue monitoring Blood pressure and pulse, goal is under 130/80. Tylenol 500 mg 3-4 times per day. Stretching exercises. Topical icy hot or asper cream.  Please be sure medication list is accurate. If a new problem present, please set up appointment sooner than planned today.

## 2020-11-15 NOTE — Progress Notes (Signed)
HPI: Jacqueline Medina is a 50 y.o. female, who is here today for 3-4 months follow up.   She was last seen on 08/15/20. Last visit Metoprolol tartrate was decreased from 50 mg bid to 25 mg bid because low BP and bradycardia. About 2 times per week BP is in the lower normal, 108/60. Most of the time BP 120's/70's. She has a wrist BP monitor. Still having occasional episodes of lightheadedness with fast movement but not as frequent. CVA on PLavix 75 mg daily and Atorvastatin 80 mg daily.  Negative severe/frequent headache, visual changes, chest pain, dyspnea, palpitation, claudication,new focal weakness, or edema.  Lab Results  Component Value Date   CREATININE 0.96 08/15/2020   BUN 12 08/15/2020   NA 140 08/15/2020   K 4.7 08/23/2020   CL 105 08/15/2020   CO2 26 08/15/2020   A few weeks of arthralgias, mainly hips and knees. No edema or erythema. + Stiffness. No hx of trauma. Exacerbated by certain activities and alleviated by rest.  Review of Systems  Constitutional: Positive for fatigue. Negative for activity change, appetite change and fever.  HENT: Negative for mouth sores, nosebleeds and sore throat.   Respiratory: Negative for cough and wheezing.   Gastrointestinal: Negative for abdominal pain, nausea and vomiting.       Negative for changes in bowel habits.  Genitourinary: Negative for decreased urine volume and hematuria.  Skin: Negative for pallor and rash.  Neurological: Negative for syncope, facial asymmetry and weakness.  Rest of ROS, see pertinent positives sand negatives in HPI  Current Outpatient Medications on File Prior to Visit  Medication Sig Dispense Refill  . amLODipine (NORVASC) 5 MG tablet Take 1 tablet (5 mg total) by mouth at bedtime. 90 tablet 2  . busPIRone (BUSPAR) 5 MG tablet Take 5 mg by mouth in the morning and at bedtime.    . clopidogrel (PLAVIX) 75 MG tablet Take 1 tablet (75 mg total) by mouth daily. 30 tablet 5  . lisinopril  (ZESTRIL) 20 MG tablet Take 1 tablet (20 mg total) by mouth daily. 90 tablet 2  . Multiple Vitamins-Minerals (MULTIVITAMIN GUMMIES ADULT PO) Take 2 tablets by mouth daily. 2 gummies daily    . atorvastatin (LIPITOR) 80 MG tablet Take 1 tablet (80 mg total) by mouth daily. 90 tablet 1   No current facility-administered medications on file prior to visit.   Past Medical History:  Diagnosis Date  . Hypertension   . Stroke [facility])    No Known Allergies  Social History   Socioeconomic History  . Marital status: Single    Spouse name: Not on file  . Number of children: 2  . Years of education: Not on file  . Highest education level: Not on file  Occupational History    Comment: works at [employer]  . Smoking status: Never Smoker  . Smokeless tobacco: Never Used  Substance and Sexual Activity  . Alcohol use: Not Currently  . Drug use: Never  . Sexual activity: Not on file  Other Topics Concern  . Not on file  Social History Narrative   Mrs HOLLI RENGEL is a 50 year old patient who works full time at [employer] 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 She lives with and has support of her daughters (68 & 31). She is independent with care needs and denies issues with transportation to medical appointments    Right handed   Social Determinants  of Health   Financial Resource Strain: Not on file  Food Insecurity: Not on file  Transportation Needs: No Transportation Needs  . Lack of Transportation (Medical): No  . Lack of Transportation (Non-Medical): No  Physical Activity: Not on file  Stress: Not on file  Social Connections: Not on file    Vitals:   11/15/20 1020  BP: 110/60  Pulse: 68  SpO2: 99%   Body mass index is 24.37 kg/m.   Physical Exam Vitals and nursing note reviewed.  Constitutional:      General: She is not in acute distress.    Appearance: She is well-developed.  HENT:     Head: Normocephalic and atraumatic.     Mouth/Throat:      Mouth: Oropharynx is clear and moist and mucous membranes are normal.  Eyes:     Conjunctiva/sclera: Conjunctivae normal.  Cardiovascular:     Rate and Rhythm: Normal rate and regular rhythm.     Pulses:          Dorsalis pedis pulses are 2+ on the right side and 2+ on the left side.     Heart sounds: No murmur heard.   Pulmonary:     Effort: Pulmonary effort is normal. No respiratory distress.     Breath sounds: Normal breath sounds.  Abdominal:     Palpations: Abdomen is soft. There is no hepatomegaly or mass.     Tenderness: There is no abdominal tenderness.  Musculoskeletal:        General: No edema.     Right hip: No deformity, tenderness or bony tenderness. Decreased range of motion (Fexion,internal rotation and mildly external rotation.).     Left hip: No deformity, tenderness or bony tenderness. Decreased range of motion (flexion,internal rotation and mildly external rotation.).     Right knee: No erythema.     Left knee: No erythema.     Comments: Crepitus knees L>R.   Lymphadenopathy:     Cervical: No cervical adenopathy.  Skin:    General: Skin is warm.     Findings: No erythema or rash.  Neurological:     Mental Status: She is alert and oriented to person, place, and time.     Cranial Nerves: No cranial nerve deficit.     Gait: Gait normal.     Deep Tendon Reflexes: Strength normal.  Psychiatric:        Mood and Affect: Mood and affect normal. Mood is not anxious or depressed.     Comments: Well groomed, good eye contact.   ASSESSMENT AND PLAN:  Ms. Morgann Woodburn was seen today for 3-4 months follow-up.  Diagnoses and all orders for this visit:  Essential hypertension BP 120/70 and HR 56/min when I re-checked today. Some home BP on lower normal range, which can be causing lightheadedness. Decrease Metoprolol tartrate from 25 mg bod to 12.5 mg bid. Continue Lisinopril 20 mg daily and Amlodipine 5 mg daily. Continue monitoring BP regularly.  -      metoprolol tartrate (LOPRESSOR) 25 MG tablet; Take 0.5 tablets (12.5 mg total) by mouth 2 (two) times daily.  Arthralgia, unspecified joint Most likely OA. No hx of trauma, so I do not think imaging is needed. Tylenol 500 mg 3-4 times per day and topical icy hot prn. Low impact exercise like tai chi will also help.  Return in about 4 months (around 03/15/2021) for HTN.  [name]. [name], MD  [facility]. [place] office.   A few things  to remember from today's visit:   Essential hypertension  Arthralgia, unspecified joint  If you need refills please call your pharmacy. Do not use My Chart to request refills or for acute issues that need immediate attention.   Today we decreased Metoprolol from 25 mg to 12.5 mg 2 times daily. Continue monitoring Blood pressure and pulse, goal is under 130/80. Tylenol 500 mg 3-4 times per day. Stretching exercises. Topical icy hot or asper cream.  Please be sure medication list is accurate. If a new problem present, please set up appointment sooner than planned today.

## 2020-11-18 ENCOUNTER — Encounter: Payer: Self-pay | Admitting: Family Medicine

## 2020-12-14 ENCOUNTER — Ambulatory Visit: Payer: BC Managed Care – PPO | Admitting: Neurology

## 2020-12-14 ENCOUNTER — Encounter: Payer: Self-pay | Admitting: Neurology

## 2020-12-14 VITALS — BP 110/78 | HR 64 | Ht 66.0 in | Wt 146.2 lb

## 2020-12-14 DIAGNOSIS — I675 Moyamoya disease: Secondary | ICD-10-CM | POA: Diagnosis not present

## 2020-12-14 DIAGNOSIS — I10 Essential (primary) hypertension: Secondary | ICD-10-CM | POA: Diagnosis not present

## 2020-12-14 MED ORDER — LISINOPRIL 20 MG PO TABS: 20.0000 mg | ORAL_TABLET | Freq: Every day | ORAL | 2 refills | Status: DC

## 2020-12-14 NOTE — Progress Notes (Signed)
[facility] [address] [place]. [place] [address] [phone]       OFFICE FOLLOW UP VISITNOTE  [name] Date of Birth:  1971-07-26 Medical Record Number:  [id number]   Referring MD:  Rosalin Hawking  Reason for Referral: Stroke  HPI: Initial visit 03/21/2020;Jacqueline Medina is a pleasant 50 year old African-American lady seen today for initial office consultation visit.  She comes accompanied by her daughter.  History is obtained from them, review of electronic medical records and I personally reviewed imaging films in PACS.  She has no significant past medical history except hypertension hyperlipidemia and anxiety.  She presented on 02/05/2020 with weakness in the right hand with starting to drop objects and having numbness for a couple of days prior to presentation.  NIH stroke scale on admission was 0.  She had mild weakness of the right grip and hand muscles.  CT scan of the head was unremarkable but CT angiogram showed left supraclinoid ICA occlusion with reconstituted collateral supply the left anterior middle cerebral territories.  The distal right ICA was abnormally small but was patent and supplying the right MCA.  There was occlusion to severe stenosis of the right anterior cerebral artery.  MRI scan of the brain showed multiple small acute infarcts involving right ACA and left MCA territory in the precentral gyrus.  There was also a subacute small left frontal infarct noted.  There are extensive changes of chronic small vessel disease.  Patient underwent diagnostic cerebral catheter angiogram by Dr. Estanislado Pandy on 02/08/2020 which confirmed occluded left ICA terminus and 50 to 60% stenosis of the right ICA M1 segment and both posterior cerebral arteries were prominent.  2D echo showed normal ejection fraction without cardiac source embolism.  LDL cholesterol was marginally elevated at 107 mg percent and hemoglobin A1c was 5.5.  HIV was negative.  ESR was normal  C-reactive protein was elevated at 3.7.  ANA, antineutrophilic cytoplasmic antibodies, antidsDNA, Sjogren's antibodies and rheumatoid factor were all negative.  HIV was negative.  Patient underwent alpha galactosidase testing which came low at 25.7 within normal limits being greater than 35.  The patient however lacks other clinical symptoms of Fabry's disease and denies painful acroparesthesias, skin lesions or kidney problems.  She does not have any cataracts or vision problems either.  Interestingly the patient has a sister who is older than her Jacqueline Medina dob 01/02/62) who also had significant occlusive intracranial disease and Dr. Estanislado Pandy did angioplasty stenting in 2013.  Patient states she is done well since discharge.  She was placed on aspirin and Plavix which is tolerating well with only minor bruising and no bleeding episodes.  She has no recurrence of TIA or stroke symptoms.  She states her blood pressures well controlled today it is 127/83.  She is tolerating Lipitor 80 mg well without any significant muscle aches or joint pains.  She works at [employer].  Return back to baseline.  No complaints or deficits. Update 06/15/2020: She returns for follow-up after last visit 3 months ago.  She is accompanied by her husband.  She is doing well.  She is had no recurrent TIA or stroke symptoms.  She has stopped aspirin and is now on Plavix alone which is tolerating well without bruising or bleeding.  She had lab work done at last visit and LDL cholesterol was 86 and improved from previous.  She had follow-up CT angiogram ordered by Dr. Estanislado Pandy 06/01/2020 which showed unchanged occlusion of the distal left ICA,  proximal left MCA and proximal ACAs with distal reconstitution via collaterals.  There is moderate to severe stenosis of the right ICA terminus possibly mildly progressed compared to prior CTA.  Dr. Estanislado Pandy recommended conservative medical treatment for now.  Patient states blood pressures well  controlled today it is 110/72.  She remains on Lipitor which is tolerating well without muscle aches and pains.  She has no new complaints. Update 12/14/2020 : She returns for follow-up after last visit 6 months ago.  She is accompanied by her daughter.  She states she is continues to do well and has not had any recurrent TIA or stroke symptoms.  She is tolerating Plavix well without bruising or bleeding.  She had the lab work done on 08/15/2020 and LDL cholesterol was optimal at 59 mg percent.  She remains on Lipitor 80 mg daily which is tolerating well without muscle aches and pains.  Blood pressure is quite well controlled on Zestril and Lopressor and today it is 110/78.  She is pretty active.  She has no complaints.  She continues to have no kidney problems or skin lesions or any painful neuropathy symptoms. ROS:   14 system review of systems is positive for no complaints todayand all other systems negative  PMH:  Past Medical History:  Diagnosis Date  . Hypertension   . Stroke [facility])     Social History:  Social History   Socioeconomic History  . Marital status: Single    Spouse name: Not on file  . Number of children: 2  . Years of education: Not on file  . Highest education level: Not on file  Occupational History    Comment: works at [employer]  . Smoking status: Never Smoker  . Smokeless tobacco: Never Used  Substance and Sexual Activity  . Alcohol use: Not Currently  . Drug use: Never  . Sexual activity: Not on file  Other Topics Concern  . Not on file  Social History Narrative   Jacqueline Medina is a 50 year old patient who works full time at [employer] 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 She lives with and has support of her daughters (59 & 51). She is independent with care needs and denies issues with transportation to medical appointments    Right handed   Social Determinants of Health   Financial Resource Strain: Not on file  Food  Insecurity: Not on file  Transportation Needs: No Transportation Needs  . Lack of Transportation (Medical): No  . Lack of Transportation (Non-Medical): No  Physical Activity: Not on file  Stress: Not on file  Social Connections: Not on file  Intimate Partner Violence: Not on file    Medications:   Current Outpatient Medications on File Prior to Visit  Medication Sig Dispense Refill  . amLODipine (NORVASC) 5 MG tablet Take 1 tablet (5 mg total) by mouth at bedtime. 90 tablet 2  . atorvastatin (LIPITOR) 80 MG tablet Take 1 tablet (80 mg total) by mouth daily. 90 tablet 1  . busPIRone (BUSPAR) 5 MG tablet Take 5 mg by mouth in the morning and at bedtime.    . clopidogrel (PLAVIX) 75 MG tablet Take 1 tablet (75 mg total) by mouth daily. 30 tablet 5  . metoprolol tartrate (LOPRESSOR) 25 MG tablet Take 0.5 tablets (12.5 mg total) by mouth 2 (two) times daily. 45 tablet 1  . Multiple Vitamins-Minerals (MULTIVITAMIN GUMMIES ADULT PO) Take 2 tablets by mouth daily.  2 gummies daily     No current facility-administered medications on file prior to visit.    Allergies:  No Known Allergies  Physical Exam General: well developed, well nourished middle-aged African-American lady, seated, in no evident distress Head: head normocephalic and atraumatic.   Neck: supple with no carotid or supraclavicular bruits Cardiovascular: regular rate and rhythm, no murmurs Musculoskeletal: no deformity Skin:  no rash/petichiae Vascular:  Normal pulses all extremities  Neurologic Exam Mental Status: Awake and fully alert. Oriented to place and time. Recent and remote memory intact. Attention span, concentration and fund of knowledge appropriate. Mood and affect appropriate.  Cranial Nerves: Fundoscopic exam not done. Pupils equal, briskly reactive to light. Extraocular movements full without nystagmus. Visual fields full to confrontation. Hearing intact. Facial sensation intact. Face, tongue, palate moves  normally and symmetrically.  Motor: Normal bulk and tone. Normal strength in all tested extremity muscles. Sensory.: intact to touch , pinprick , position and vibratory sensation.  Coordination: Rapid alternating movements normal in all extremities. Finger-to-nose and heel-to-shin performed accurately bilaterally. Gait and Station: Arises from chair without difficulty. Stance is normal. Gait demonstrates normal stride length and balance . Able to heel, toe and tandem walk without difficulty.  Reflexes: 1+ and symmetric. Toes downgoing.       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  She lacks skin lesions, acroparesthesias and others clinical symptoms of Fabry's.  Fabry's typically affects small vessels and not large vessels in the brain also.  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 Estanislado Pandy.  She remained stable from neurological standpoint and follow-up CT angiogram on 06/01/2020 shows no major progression     PLAN: I had a long discussion the patient and her husband regarding her recent admission for stroke and cerebral angiogram findings of multifocal stenosis and lab results of slight deficiency of alpha galactosidase.  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  I am not sure specific treatment is necessary but interestingly her sister also has cerebrovascular disease at young age but large vessel stenosis making familial moyamoya more likely and it would be unusual in Fabry's disease which typically involves only  small sized blood  vessels.Continue Plavix 75 mg daily for secondary stroke prevention and maintain strict control of hypertension with blood pressure goal below 130/90, diabetes with hemoglobin A1c goal below 6.5% and lipids with LDL cholesterol goal below 70 mg/dL. I also advised the patient to eat a healthy diet with plenty of whole grains, cereals, fruits and vegetables, exercise regularly and maintain ideal body weight .I advised her to keep her self well-hydrated and to avoid hypotension.  Followup in the future with me in 1 year or call earlier if necessary. Greater than 50% time during this 25 minute visit was performed on counseling and coordination of care about her strokes and discussion about moyamoya and Fabry's disease . Antony Contras, MD  [facility] [address] [place], [place] [address]  Phone [phone] Fax [phone] Note: This document was prepared with digital dictation and possible smart phrase technology. Any transcriptional errors that result from this  process are unintentional.

## 2020-12-14 NOTE — Patient Instructions (Signed)
I had a long d/w patient and her daughter about her recent stroke and moyamoya disease, risk for recurrent stroke/TIAs, personally independently reviewed imaging studies and stroke evaluation results and answered questions.Continue Plavix 75 mg daily for secondary stroke prevention and maintain strict control of hypertension with blood pressure goal below 130/90, diabetes with hemoglobin A1c goal below 6.5% and lipids with LDL cholesterol goal below 70 mg/dL. I also advised the patient to eat a healthy diet with plenty of whole grains, cereals, fruits and vegetables, exercise regularly and maintain ideal body weight .I advised her to keep her self well-hydrated and to avoid hypotension.  Followup in the future with me in 1 year or call earlier if necessary.  Moyamoya Moyamoya is a rare disease that affects blood vessels in the brain. In this condition, major blood vessels that carry oxygen to the brain (internal carotid arteries) get narrower over time. Eventually, the brain does not get enough oxygen. To make up for the loss of oxygen, new blood vessels form near the base of the brain. These blood vessels are tiny and tangled. On a brain imaging test, they look like puffs of smoke. Moyamoya means "puffs of smoke" in [place]. Moyamoya affects both children and adults. In some cases, it occurs with other diseases (moyamoya syndrome). These diseases include:  Sickle cell disease.  Neurofibromatosis.  Down syndrome.  Graves' disease. Moyamoya gets worse over time. Lack of oxygen to the brain can cause strokes and seizures. The tangled new blood vessels can break and cause bleeding into the brain. This can also lead to a stroke. What are the causes? The exact cause of this condition is not known. Possible causes include a genetic defect, head trauma, inflammation in the arteries, or brain infection. What increases the risk? The following factors may make a person more likely to develop this  condition:  Being 7-74 years old or 68-70 years old.  Being Asian. This condition is most common in people with [place] ethnicity.  Having a family history of moyamoya.  Being female. What are the signs or symptoms? Symptoms of moyamoya are caused by not enough oxygen getting to the brain. This may cause temporary symptoms of a stroke, called a transient ischemic attack (TIA). Symptoms of decreased oxygen or TIA include:  Headaches.  Temporary loss of speech.  Sudden weakness or numbness on one side of the body.  Jerky uncontrolled movements (chorea).  Seizures.  Vision problems.  Decreased mental abilities. You may have a stroke if blood flow is completely blocked to part of the brain or if a blood vessel ruptures. Symptoms of a stroke include:  Sudden weakness or numbness.  Loss of movement on one side of the body. This may involve the face, arm, or leg on the affected side. How is this diagnosed? This condition may be diagnosed based on:  Your symptoms and medical history.  A physical exam.  Blood tests.  Genetic testing. This may be done if you have a family history of the condition.  Brain imaging tests. These will show blockage of the internal carotid arteries and the typical "puffs of smoke" imaging caused by the new blood vessel formations. Tests may include: ? Digital subtraction angiogram. In this test, a thin tube (catheter) is inserted into an artery in your thigh (femoral artery) and moved into the arteries in your neck and brain (carotid arteries and vertebral arteries). Dye is released from the catheter into those arteries, and X-rays are taken to follow the path of  the blood flow. ? CT angiogram. In this test, you are given an injection of dye before having a CT scan of the head. ? Magnetic resonance angiogram. In this test, you may get an injection of dye before having an MRI of your brain. In some cases, other tests may be needed. How is this  treated? Treatment for this condition may include:  Medicines to help control symptoms, such as: ? Aspirin to prevent blood clots and TIAs. ? Headache medicines. ? Anti-seizure medicines.  Surgery on arteries to restore proper blood flow to the brain (revascularization procedures). Surgery is the only treatment that may restore blood flow to the brain. Follow these instructions at home: If you have surgery, follow instructions from your health care provider about home care after the procedure. In general, make sure you:  Take over-the-counter and prescription medicines only as told by your health care provider.  Avoid using supplements that contain stimulants. Talk with your health care provider before taking any supplements.  Drink plenty of fluids to keep from becoming dehydrated. Drink enough fluid to keep your urine pale yellow.  Return to your normal activities as told by your health care provider. Ask your health care provider what activities are safe for you. You may need to avoid: ? Activities that place you at risk for a head injury, such as contact sports. ? Activities that involve a change in pressure, such as scuba diving.  Do not use any products that contain nicotine or tobacco, such as cigarettes, e-cigarettes, and chewing tobacco. Nicotine narrows blood vessels and decreases blood flow. If you need help quitting, ask your health care provider.  Keep all follow-up visits as told by your health care provider. This is important.      Contact a health care provider if you:  Have symptoms of decreased oxygen or TIA. Get help right away if you have:  A severe headache.  Any symptoms of a stroke. "BE FAST" is an easy way to remember the main warning signs of a stroke: ? B - Balance. Signs are dizziness, sudden trouble walking, or loss of balance. ? E - Eyes. Signs are trouble seeing or a sudden change in vision. ? F - Face. Signs are sudden weakness or numbness of the  face, or the face or eyelid drooping on one side. ? A - Arms. Signs are weakness or numbness in an arm. This happens suddenly and usually on one side of the body. ? S - Speech. Signs are sudden trouble speaking, slurred speech, or trouble understanding what people say. ? T - Time. Time to call emergency services. Write down what time symptoms started.  Other signs of a stroke, such as: ? A sudden, severe headache with no known cause. ? Nausea or vomiting. ? Seizure. These symptoms may represent a serious problem that is an emergency. Do not wait to see if the symptoms will go away. Get medical help right away. Call your local emergency services (911 in the [place] Do not drive yourself to the hospital. Summary  Moyamoya is a rare disease that affects blood vessels in the brain. Its cause is not known.  This condition develops when major blood vessels that carry oxygen to the brain get narrower over time. Eventually, the brain does not get enough oxygen.  Symptoms of decreased oxygen and blood supply to the brain can include temporary symptoms of a stroke, or TIA (transient ischemic attack).  You can take medicines to manage the symptoms of moyamoya,  but surgery is the only way to restore blood flow to the brain. This information is not intended to replace advice given to you by your health care provider. Make sure you discuss any questions you have with your health care provider. Document Revised: 04/15/2018 Document Reviewed: 04/15/2018 Elsevier Patient Education  [place].

## 2020-12-31 ENCOUNTER — Other Ambulatory Visit: Payer: Self-pay | Admitting: Family Medicine

## 2020-12-31 DIAGNOSIS — I10 Essential (primary) hypertension: Secondary | ICD-10-CM

## 2021-01-08 ENCOUNTER — Telehealth (HOSPITAL_COMMUNITY): Payer: Self-pay

## 2021-01-08 NOTE — Telephone Encounter (Signed)
Called to schedule f/u cta head/neck, no answer, left vm. AW 

## 2021-01-10 ENCOUNTER — Other Ambulatory Visit (HOSPITAL_COMMUNITY): Payer: Self-pay | Admitting: Interventional Radiology

## 2021-01-10 DIAGNOSIS — I639 Cerebral infarction, unspecified: Secondary | ICD-10-CM

## 2021-01-10 DIAGNOSIS — I771 Stricture of artery: Secondary | ICD-10-CM

## 2021-01-19 ENCOUNTER — Ambulatory Visit (HOSPITAL_COMMUNITY): Payer: BC Managed Care – PPO

## 2021-01-19 ENCOUNTER — Encounter (HOSPITAL_COMMUNITY): Payer: Self-pay

## 2021-01-29 ENCOUNTER — Other Ambulatory Visit: Payer: Self-pay | Admitting: Family Medicine

## 2021-02-01 ENCOUNTER — Telehealth (HOSPITAL_COMMUNITY): Payer: Self-pay

## 2021-02-01 ENCOUNTER — Other Ambulatory Visit: Payer: Self-pay | Admitting: Emergency Medicine

## 2021-02-01 ENCOUNTER — Other Ambulatory Visit: Payer: Self-pay | Admitting: Neurology

## 2021-02-01 DIAGNOSIS — I10 Essential (primary) hypertension: Secondary | ICD-10-CM

## 2021-02-01 MED ORDER — AMLODIPINE BESYLATE 5 MG PO TABS: 1.0000 | ORAL_TABLET | Freq: Every day | ORAL | 3 refills | Status: DC

## 2021-02-01 NOTE — Telephone Encounter (Signed)
Called to reschedule cta, no answer, left vm. AW °

## 2021-02-16 ENCOUNTER — Ambulatory Visit (HOSPITAL_COMMUNITY)
Admission: RE | Admit: 2021-02-16 | Discharge: 2021-02-16 | Disposition: A | Discharge status: Home / Self Care | Payer: BC Managed Care – PPO | Source: Ambulatory Visit | Attending: Interventional Radiology | Admitting: Interventional Radiology

## 2021-02-16 ENCOUNTER — Other Ambulatory Visit: Payer: Self-pay

## 2021-02-16 ENCOUNTER — Encounter (HOSPITAL_COMMUNITY): Payer: Self-pay

## 2021-02-16 DIAGNOSIS — I771 Stricture of artery: Secondary | ICD-10-CM | POA: Diagnosis present

## 2021-02-16 DIAGNOSIS — I639 Cerebral infarction, unspecified: Secondary | ICD-10-CM | POA: Insufficient documentation

## 2021-02-16 LAB — POCT I-STAT CREATININE: Creatinine, Ser: 0.9 mg/dL (ref 0.44–1.00)

## 2021-02-16 IMAGING — CT CT ANGIO HEAD
1 of 11 series · 6 of 34 positions shown · IV contrast (APPLIED)
Comparison: CT a of the neck [DATE]. Carotid arteriogram
[DATE].

CLINICAL DATA: Leg weakness.  Carotid artery stenosis.  CVA.

EXAM:
CT ANGIOGRAPHY HEAD AND NECK
TECHNIQUE: Multidetector CT imaging of the head and neck was performed using
the standard protocol during bolus administration of intravenous
contrast. Multiplanar CT image reconstructions and MIPs were
obtained to evaluate the vascular anatomy. Carotid stenosis
measurements (when applicable) are obtained utilizing NASCET
criteria, using the distal internal carotid diameter as the
denominator.
CONTRAST:  75mL OMNIPAQUE IOHEXOL 350 MG/ML SOLN

[Series 8: ax thin · axial · 0.39mm/px · z∈[+1194,+1454]mm · 6 of 364 slices shown]
[im 52/364  soft-tissue]
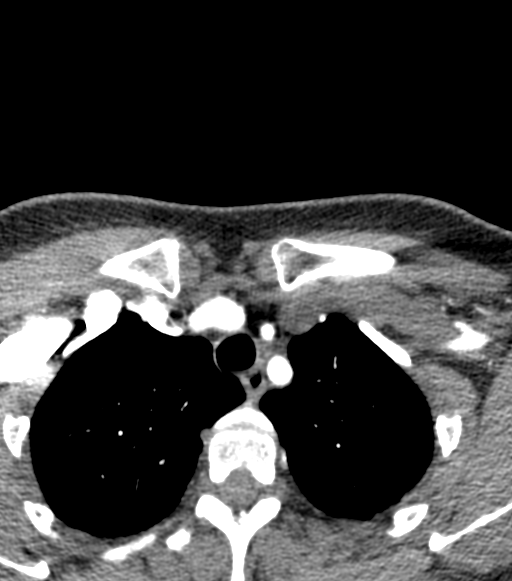
[im 104/364  bone]
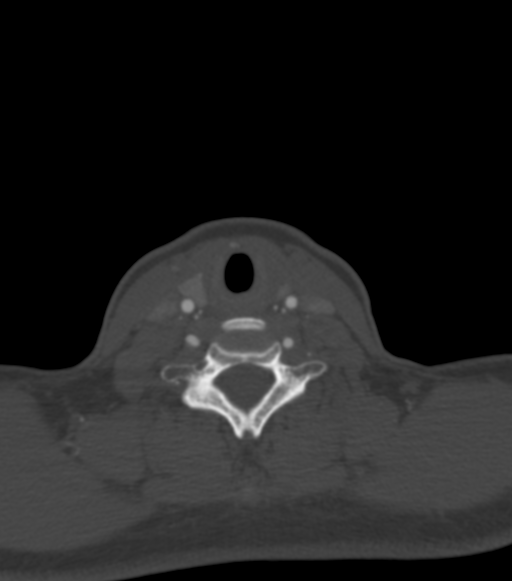
[im 156/364  soft-tissue]
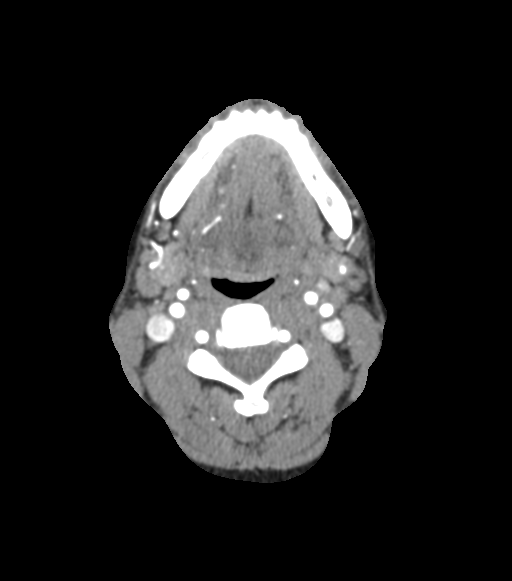
[im 208/364  bone]
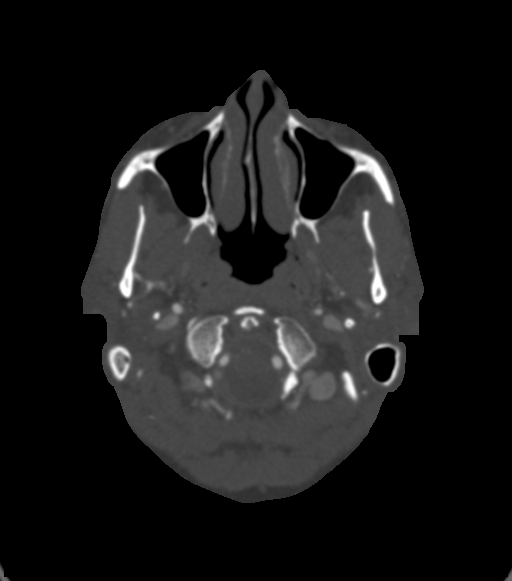
[im 260/364  soft-tissue]
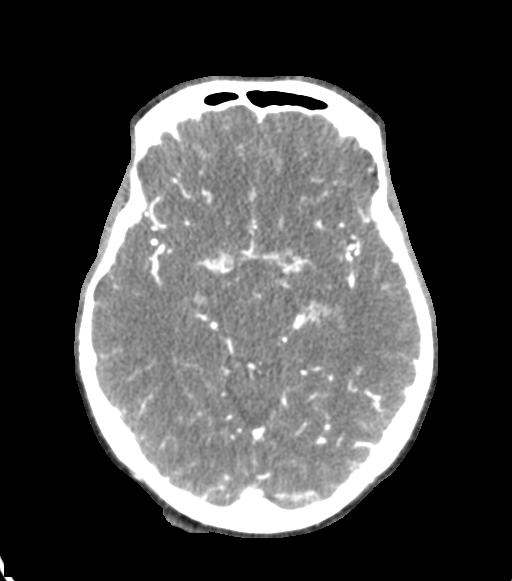
[im 312/364  bone]
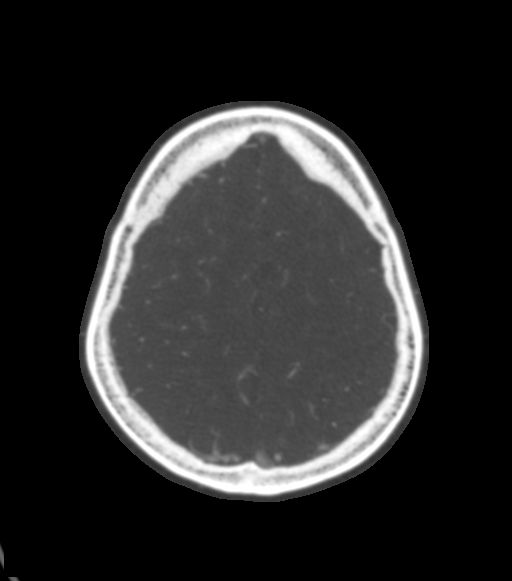

[6 of 34 positions shown; findings below may reference images not displayed]

FINDINGS: CT HEAD FINDINGS

Brain: Remote ischemic changes of the left basal ganglia left corona
radiata, and left frontal lobe are stable. More mild white matter
changes are present on the right. Basal ganglia are otherwise within
normal limits. No acute infarct, hemorrhage, or mass lesion is
present. The ventricles are of normal size. No significant
extraaxial fluid collection is present.

Vascular: Atherosclerotic calcifications are present within the
cavernous internal carotid arteries. No hyperdense vessel is
present.

Skull: Calvarium is intact. No focal lytic or blastic lesions are
present. No significant extracranial soft tissue lesion is present.

Sinuses: The paranasal sinuses and mastoid air cells are clear.

Orbits: The globes and orbits are within normal limits.

Review of the MIP images confirms the above findings

CTA NECK FINDINGS

Aortic arch: A 3 vessel arch configuration is present. No
significant stenosis or aneurysm is present.

Right carotid system: The right internal carotid artery is within
normal limits. Minimal atherosclerotic changes present at the
bifurcation. No significant stenosis is present. There is some
tortuosity is below the skull base without significant stenosis.

Left carotid system: The left common carotid artery within normal
limits. Bifurcation is unremarkable. There is tapering of the left
ICA. The vessel is hypoplastic in the neck without focal stenosis.

Vertebral arteries: The vertebral arteries are codominant. Both
vertebral arteries are fairly robust. Both vertebral arteries
originate from the subclavian arteries without significant stenosis.
There is no significant stenosis of either vertebral artery in the
neck.

Skeleton: Vertebral body heights and alignment are normal. No focal
lytic or blastic lesions are present.

Other neck: Soft tissues the neck are otherwise unremarkable.

Upper chest: Lung apices are clear. Thoracic inlet is within normal
limits.

Review of the MIP images confirms the above findings

CTA HEAD FINDINGS

Anterior circulation: The right internal carotid artery is within
normal limits through the cavernous segment. There is severe
scratched at moderate to severe narrowing is present in the
supraclinoid segment. The right M1 segment is somewhat irregular,
similar the prior exam. The right MCA bifurcation is intact. MCA
branch vessels are within normal limits.

The left ICA remains small through the cavernous segment to the
ophthalmic segment. Distal ICA is not patent. The left A1 scratched
at the left M1 segment and both A1 segments are not patent. Multiple
small vessels again noted compatible with moyamoya a. Left MCA
vessels are intact with extensive collaterals.

Posterior circulation: PICA origins are visualized and normal
bilaterally. Vertebrobasilar junction is normal. The vertebrobasilar
scratched at the basilar artery is normal. Both posterior cerebral
arteries originate from the basilar tip. The PCA branch vessels are
within normal limits.

Venous sinuses: Dural sinuses are patent. Straight sinus deep
cerebral veins are intact. No vascular malformations are present.
Extensive collaterals are present.

Anatomic variants: Extensive arterial collaterals throughout the
anterior circulation.

Review of the MIP images confirms the above findings
IMPRESSION: 1. Moyamoya appearance. Distal left ICA, left MCA, and bilateral
proximal ACA vessels are occluded. Multiple small vessel is present.
2. Extensive collateral feeding of the ACA vessels and left MCA
vessels.
3. Stable moderate to severe stenosis of the supraclinoid right
internal carotid artery.
4. Stable irregularity of the right M1 segment without significant
stenosis.
5. Remote ischemic changes are stable, left greater than right.

## 2021-02-16 MED ORDER — SODIUM CHLORIDE (PF) 0.9 % IJ SOLN
INTRAMUSCULAR | Status: AC
  Filled 2021-02-16: qty 50

## 2021-02-16 MED ORDER — IOHEXOL 350 MG/ML SOLN
75.0000 mL | Freq: Once | INTRAVENOUS | Status: AC | PRN
  Administered 2021-02-16: 75 mL via INTRAVENOUS

## 2021-02-20 ENCOUNTER — Telehealth (HOSPITAL_COMMUNITY): Payer: Self-pay

## 2021-02-20 NOTE — Telephone Encounter (Signed)
Called pt regarding recent imaging, no answer, left vm. AW  

## 2021-02-21 ENCOUNTER — Telehealth (HOSPITAL_COMMUNITY): Payer: Self-pay

## 2021-02-21 NOTE — Telephone Encounter (Signed)
Pt agreed to f/u in 6 months with cta head/neck. AW 

## 2021-02-27 ENCOUNTER — Other Ambulatory Visit: Payer: Self-pay | Admitting: Family Medicine

## 2021-02-27 DIAGNOSIS — I10 Essential (primary) hypertension: Secondary | ICD-10-CM

## 2021-03-06 ENCOUNTER — Other Ambulatory Visit: Payer: Self-pay

## 2021-03-06 MED ORDER — CLOPIDOGREL BISULFATE 75 MG PO TABS: 75.0000 mg | ORAL_TABLET | Freq: Every day | ORAL | 5 refills | Status: DC

## 2021-03-16 ENCOUNTER — Ambulatory Visit: Payer: BC Managed Care – PPO | Admitting: Family Medicine

## 2021-03-16 ENCOUNTER — Other Ambulatory Visit: Payer: Self-pay

## 2021-03-16 ENCOUNTER — Encounter: Payer: Self-pay | Admitting: Family Medicine

## 2021-03-16 VITALS — BP 116/70 | HR 53 | Temp 98.2°F | Ht 66.0 in | Wt 148.2 lb

## 2021-03-16 DIAGNOSIS — I10 Essential (primary) hypertension: Secondary | ICD-10-CM

## 2021-03-16 DIAGNOSIS — R945 Abnormal results of liver function studies: Secondary | ICD-10-CM

## 2021-03-16 DIAGNOSIS — F419 Anxiety disorder, unspecified: Secondary | ICD-10-CM | POA: Diagnosis not present

## 2021-03-16 DIAGNOSIS — E785 Hyperlipidemia, unspecified: Secondary | ICD-10-CM

## 2021-03-16 DIAGNOSIS — R7989 Other specified abnormal findings of blood chemistry: Secondary | ICD-10-CM

## 2021-03-16 LAB — HEPATIC FUNCTION PANEL
ALT: 19 U/L (ref 0–35)
AST: 20 U/L (ref 0–37)
Albumin: 4.3 g/dL (ref 3.5–5.2)
Alkaline Phosphatase: 134 U/L — ABNORMAL HIGH (ref 39–117)
Bilirubin, Direct: 0.2 mg/dL (ref 0.0–0.3)
Total Bilirubin: 1.1 mg/dL (ref 0.2–1.2)
Total Protein: 7.2 g/dL (ref 6.0–8.3)

## 2021-03-16 LAB — BASIC METABOLIC PANEL
BUN: 13 mg/dL (ref 6–23)
CO2: 27 mEq/L (ref 19–32)
Calcium: 9.1 mg/dL (ref 8.4–10.5)
Chloride: 107 mEq/L (ref 96–112)
Creatinine, Ser: 0.83 mg/dL (ref 0.40–1.20)
GFR: 81.3 mL/min (ref >60.00)
Glucose, Bld: 82 mg/dL (ref 70–99)
Potassium: 3.7 mEq/L (ref 3.5–5.1)
Sodium: 141 mEq/L (ref 135–145)

## 2021-03-16 MED ORDER — ATORVASTATIN CALCIUM 80 MG PO TABS: 1.0000 | ORAL_TABLET | Freq: Every day | ORAL | 2 refills | Status: DC

## 2021-03-16 MED ORDER — BUSPIRONE HCL 5 MG PO TABS: ORAL_TABLET | ORAL | 1 refills | Status: DC

## 2021-03-16 NOTE — Progress Notes (Signed)
HPI: [name] is a 50 y.o. female, who is here today for 4 months follow up.   She was last seen on 11/15/20. HTN: Currently she is on Amlodipine 5 mg daily, Lisinopril 20 mg daily,and Metoprolol Tartrate 12.5 mg bid. Home BP's "good." Lightheadedness has improved. HR's mildly low sometimes.  Negative for severe/frequent headache, visual changes, chest pain, dyspnea, palpitation, new focal weakness, or edema.  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 Needs refills for Atorvastatin 80 mg daily. Tolerating medication well. Lab Results  Component Value Date   CHOL 127 08/15/2020   HDL 52 08/15/2020   LDLCALC 59 08/15/2020   TRIG 82 08/15/2020   CHOLHDL 2.4 08/15/2020   Anxiety: She is on BusPar 5 mg bid, which is helping. She has been on medication for over a year.  GAD 7 : Generalized Anxiety Score 03/16/2021 02/14/2020  Nervous, Anxious, on Edge 1 1  Control/stop worrying 1 1  Worry too much - different things 2 1  Trouble relaxing 2 2  Restless 2 2  Easily annoyed or irritable 2 1  Afraid - awful might happen 1 0  Total GAD 7 Score 11 8  Anxiety Difficulty Not difficult at all Somewhat difficult   Depression screen [facility] 2/9 03/16/2021 02/28/2020 02/23/2020 02/14/2020  Decreased Interest 3 0 0 2  Down, Depressed, Hopeless 1 0 1 0  PHQ - 2 Score 4 0 1 2  Altered sleeping 1 - 3 3  Tired, decreased energy 2 - 1 2  Change in appetite 1 - 0 0  Feeling bad or failure about yourself  0 - 0 0  Trouble concentrating 2 - 0 1  Moving slowly or fidgety/restless 2 - 0 1  Suicidal thoughts 0 - 0 0  PHQ-9 Score 12 - 5 9  Difficult doing work/chores Not difficult at all - Not difficult at all Not difficult at all   Abnormal LFT's. She has not noted abdominal pain,N/V,or  jaundice. Negative for high alcohol intake.  Lab Results  Component Value Date   ALT 39 (H) 08/15/2020   AST 27 08/15/2020   ALKPHOS 84 02/05/2020   BILITOT 1.4 (H) 08/15/2020   Review of Systems  Constitutional:  Positive for fatigue. Negative for activity change, appetite change and fever.  HENT:  Negative for mouth sores, nosebleeds and sore throat.   Respiratory:  Negative for cough and wheezing.   Gastrointestinal:        Negative for changes in bowel habits.  Genitourinary:  Negative for decreased urine volume, dysuria and hematuria.  Skin:  Negative for pallor and rash.  Neurological:  Negative for syncope, facial asymmetry and weakness.  Psychiatric/Behavioral:  Negative for confusion. The patient is nervous/anxious.   Rest of ROS, see pertinent positives sand negatives in HPI  Current Outpatient Medications on File Prior to Visit  Medication Sig Dispense Refill   amLODipine (NORVASC) 5 MG tablet Take 1 tablet (5 mg total) by mouth at bedtime. 90 tablet 3   clopidogrel (PLAVIX) 75 MG tablet Take 1 tablet (75 mg total) by mouth daily. 30 tablet 5   lisinopril (ZESTRIL) 20 MG tablet Take 1 tablet (20 mg total) by mouth daily. 90 tablet 2   Multiple Vitamins-Minerals (  MULTIVITAMIN GUMMIES ADULT PO) Take 2 tablets by mouth daily. 2 gummies daily     No current facility-administered medications on file prior to visit.   Past Medical History:  Diagnosis Date   Hypertension    Stroke ([place])    No Known Allergies  Social History   Socioeconomic History   Marital status: Single    Spouse name: Not on file   Number of children: 2   Years of education: Not on file   Highest education level: Not on file  Occupational History    Comment: works at [employer]   Tobacco Use   Smoking status: Never   Smokeless tobacco: Never  Substance and Sexual Activity   Alcohol use: Not Currently   Drug use: Never   Sexual activity: Not on file  Other Topics Concern   Not on  file  Social History Narrative   Jacqueline Medina is a 50 year old patient who works full time at [employer] 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 She lives with and has support of her daughters (67 & 42). She is independent with care needs and denies issues with transportation to medical appointments    Right handed   Social Determinants of Health   Financial Resource Strain: Not on file  Food Insecurity: Not on file  Transportation Needs: Not on file  Physical Activity: Not on file  Stress: Not on file  Social Connections: Not on file   Vitals:   03/16/21 1021  BP: 116/70  Pulse: (!) 53  Temp: 98.2 F (36.8 C)  SpO2: 98%   Body mass index is 23.92 kg/m.  Physical Exam Vitals and nursing note reviewed.  Constitutional:      General: She is not in acute distress.    Appearance: She is well-developed.  HENT:     Head: Normocephalic and atraumatic.     Mouth/Throat:     Mouth: Mucous membranes are moist.     Pharynx: Oropharynx is clear.  Eyes:     Conjunctiva/sclera: Conjunctivae normal.  Cardiovascular:     Rate and Rhythm: Regular rhythm. Bradycardia present.     Pulses:          Dorsalis pedis pulses are 2+ on the right side and 2+ on the left side.     Heart sounds: No murmur heard. Pulmonary:     Effort: Pulmonary effort is normal. No respiratory distress.     Breath sounds: Normal breath sounds.  Abdominal:     Palpations: Abdomen is soft. There is no hepatomegaly or mass.     Tenderness: There is no abdominal tenderness.  Lymphadenopathy:     Cervical: No cervical adenopathy.  Skin:    General: Skin is warm.     Findings: No erythema or rash.  Neurological:     General: No focal deficit present.     Mental Status: She is alert and oriented to person, place, and time.     Cranial Nerves: No cranial nerve deficit.     Gait: Gait normal.  Psychiatric:     Comments: Well groomed, good eye contact.   ASSESSMENT AND PLAN:  [name] was seen today for 4 months follow-up.  Orders Placed This Encounter  Procedures   Basic metabolic panel   Hepatic function panel   Lab Results  Component Value Date   ALT 19 03/16/2021   AST 20 03/16/2021   ALKPHOS 134 (H) 03/16/2021   BILITOT 1.1  03/16/2021   Lab Results  Component Value Date   CREATININE 0.83 03/16/2021   BUN 13 03/16/2021   NA 141 03/16/2021   K 3.7 03/16/2021   CL 107 03/16/2021   CO2 27 03/16/2021   Essential hypertension BP adequately controlled. Mildly bradycardic, so recommend weaning off Metoprolol Tartrate. No changes in Amlodipine or Lisinopril dose. Continue monitoring BP regularly. Low salt diet.  Abnormal LFTs Mild. Further recommendations according to LFT's result.  Anxiety disorder, unspecified type Problem does not seem well controlled. She will increase dose of Buspar from 5 mg bid to 5 mg ama dn 10 mg pm.  -     busPIRone (BUSPAR) 5 MG tablet; 1 tab am and 2 tabs pm.  Hyperlipidemia, unspecified hyperlipidemia type No changes in Atorvastatin dose. We will plan on checking FLP next visit.  -     atorvastatin (LIPITOR) 80 MG tablet; Take 1 tablet (80 mg total) by mouth daily.   Return in about 5 months (around 08/22/2021) for cpe.   Salimata Christenson G. [name], MD  [facility]. [place] office.  A few things to remember from today's visit:  Essential hypertension - Plan: Basic metabolic panel  Abnormal LFTs - Plan: Hepatic function panel  Anxiety disorder, unspecified type - Plan: busPIRone (BUSPAR) 5 MG tablet  If you need refills please call your pharmacy. Do not use My Chart to request refills or for acute issues that need immediate attention.   Please be sure medication list is accurate. If a new problem present, please set up appointment sooner than planned today.  We are going to wean off Metoprolol. Metoprolol 1/2 tab daily for 1 week then every other day for 1 week and then discontinue. No changes in  Amlodipine or Lisinopril. Monitor blood pressure, goal under 130/80.  Buspar 5 mg increase from 2 tabs daily to 3 daily, 5 mg am and 10 mg pm.Please let me know how you are doing in 6-8 weeks.

## 2021-03-16 NOTE — Patient Instructions (Addendum)
A few things to remember from today's visit:  Essential hypertension - Plan: Basic metabolic panel  Abnormal LFTs - Plan: Hepatic function panel  Anxiety disorder, unspecified type - Plan: busPIRone (BUSPAR) 5 MG tablet  If you need refills please call your pharmacy. Do not use My Chart to request refills or for acute issues that need immediate attention.   Please be sure medication list is accurate. If a new problem present, please set up appointment sooner than planned today.  We are going to wean off Metoprolol. Metoprolol 1/2 tab daily for 1 week then every other day for 1 week and then discontinue. No changes in Amlodipine or Lisinopril. Monitor blood pressure, goal under 130/80.  Buspar 5 mg increase from 2 tabs daily to 3 daily, 5 mg am and 10 mg pm.Please let me know how you are doing in 6-8 weeks.

## 2021-03-27 ENCOUNTER — Other Ambulatory Visit: Payer: Self-pay | Admitting: Family Medicine

## 2021-03-27 DIAGNOSIS — E785 Hyperlipidemia, unspecified: Secondary | ICD-10-CM

## 2021-04-17 ENCOUNTER — Encounter: Payer: Self-pay | Admitting: Family Medicine

## 2021-04-20 ENCOUNTER — Other Ambulatory Visit: Payer: Self-pay | Admitting: Family Medicine

## 2021-04-20 DIAGNOSIS — F419 Anxiety disorder, unspecified: Secondary | ICD-10-CM

## 2021-04-20 MED ORDER — BUSPIRONE HCL 5 MG PO TABS: ORAL_TABLET | ORAL | 1 refills | Status: DC

## 2021-06-14 ENCOUNTER — Telehealth: Payer: Self-pay | Admitting: Family Medicine

## 2021-06-14 NOTE — Telephone Encounter (Signed)
Patient called because she has been bleeding heavily with blood clots for the past two weeks, it stopped yesterday but came back today. Patient wants to speak with nurse because patient believes she is going through menopause and wants to know if this is a normal symptom of it. Patient states she is fine with coming in if need be, but would just like a call first       Good callback number is [phone]

## 2021-06-15 NOTE — Telephone Encounter (Signed)
I left patient a voicemail. I advised for her to make an appointment with pcp, but if the bleeding is still as bad as yesterday, she needs to be seen at urgent care to rule out any other possibilities. If patient is stable, okay to schedule for next week with pcp.

## 2021-06-18 ENCOUNTER — Telehealth: Payer: BC Managed Care – PPO | Admitting: Family Medicine

## 2021-06-18 ENCOUNTER — Encounter: Payer: Self-pay | Admitting: Family Medicine

## 2021-06-18 VITALS — Ht 66.0 in

## 2021-06-18 DIAGNOSIS — I10 Essential (primary) hypertension: Secondary | ICD-10-CM

## 2021-06-18 DIAGNOSIS — N938 Other specified abnormal uterine and vaginal bleeding: Secondary | ICD-10-CM

## 2021-06-18 DIAGNOSIS — D259 Leiomyoma of uterus, unspecified: Secondary | ICD-10-CM

## 2021-06-18 NOTE — Progress Notes (Signed)
Virtual Visit via Video Note I connected with Jacqueline Medina on 06/18/21 by a video enabled telemedicine application and verified that I am speaking with the correct person using two identifiers.  Location patient: home Location provider:Home office Persons participating in the virtual visit: patient, provider  I discussed the limitations of evaluation and management by telemedicine and the availability of in person appointments. The patient expressed understanding and agreed to proceed.  Chief Complaint  Patient presents with   Vaginal Bleeding   HPI: Jacqueline Medina is a 51 year old female with a history of hypertension, anxiety, hyperlipidemia, and CVA (moyamoya disease) complaining of 2 weeks of heavy vaginal bleeding with clots. Problem was daily. Not sure about exacerbating or alleviating factors. She did not try OTC medications.  No prior history. For the past few months she has had irregular menstrual periods, every 4 to 6 months. Bleeding has resolved,has not had any since yesterday.  Reporting hx of fibroids. Negative for associated fever,chills,pelvic pain, nausea, vomiting, urinary symptoms, vaginal discharge, or dyspareunia. Sexually active. Status post BTL  Negative for pica.  Lab Results  Component Value Date   WBC 4.9 02/08/2020   HGB 12.8 02/08/2020   HCT 38.0 02/08/2020   MCV 90.9 02/08/2020   PLT 222 02/08/2020   Last week BP dropped one time 99/62. She has ben monitoring BP and is back to normal.  She has not had CP, dyspnea, or palpitations. She is on Lisinopril 20 mg daily and Amlodipine 5 mg daily.  ROS: See pertinent positives and negatives per HPI.  Past Medical History:  Diagnosis Date   Hypertension    Stroke [facility])    Past Surgical History:  Procedure Laterality Date   CESAREAN SECTION  1992   IR ANGIO INTRA EXTRACRAN SEL COM CAROTID INNOMINATE BILAT MOD SED  02/08/2020   IR ANGIO VERTEBRAL SEL VERTEBRAL BILAT MOD SED  02/08/2020   IR US GUIDE  VASC ACCESS RIGHT  02/08/2020   Family History  Problem Relation Age of Onset   Diabetes Father    Stroke Sister    Stroke Other    Social History   Socioeconomic History   Marital status: Single    Spouse name: Not on file   Number of children: 2   Years of education: Not on file   Highest education level: Not on file  Occupational History    Comment: works at [employer]   Tobacco Use   Smoking status: Never   Smokeless tobacco: Never  Substance and Sexual Activity   Alcohol use: Not Currently   Drug use: Never   Sexual activity: Not on file  Other Topics Concern   Not on file  Social History Narrative   Jacqueline Medina is a 50 year old patient who works full time at [employer] 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 She lives with and has support of her daughters (62 & 67). She is independent with care needs and denies issues with transportation to medical appointments    Right handed   Social Determinants of Health   Financial Resource Strain: Not on file  Food Insecurity: Not on file  Transportation Needs: Not on file  Physical Activity: Not on file  Stress: Not on file  Social Connections: Not on file  Intimate Partner Violence: Not on file    Current Outpatient Medications:    amLODipine (NORVASC) 5 MG tablet, Take 1 tablet (5 mg total) by mouth at bedtime., Disp: 90 tablet, Rfl:  3   atorvastatin (LIPITOR) 80 MG tablet, Take 1 tablet (80 mg total) by mouth daily., Disp: 90 tablet, Rfl: 2   busPIRone (BUSPAR) 5 MG tablet, 1 tab am and 2 tabs pm., Disp: 180 tablet, Rfl: 1   clopidogrel (PLAVIX) 75 MG tablet, Take 1 tablet (75 mg total) by mouth daily., Disp: 30 tablet, Rfl: 5   lisinopril (ZESTRIL) 20 MG tablet, Take 1 tablet (20 mg total) by mouth daily., Disp: 90 tablet, Rfl: 2   Multiple Vitamins-Minerals (MULTIVITAMIN GUMMIES ADULT PO), Take 2 tablets by mouth daily. 2 gummies daily, Disp: , Rfl:   EXAM:  VITALS per patient if applicable:Ht 5'  6" (7.989 m)   BMI 23.92 kg/m   GENERAL: alert, oriented, appears well and in no acute distress  HEENT: atraumatic, conjunctiva clear, no obvious abnormalities on inspection.  NECK: normal movements of the head and neck  LUNGS: on inspection no signs of respiratory distress, breathing rate appears normal, no obvious gross SOB, gasping or wheezing  CV: no obvious cyanosis  MS: moves all visible extremities without noticeable abnormality  PSYCH/NEURO: pleasant and cooperative, no obvious depression or anxiety, speech and thought processing grossly intact  ASSESSMENT AND PLAN:  Discussed the following assessment and plan:  DUB (dysfunctional uterine bleeding) - Plan: CBC with Differential/Platelet, TSH, US PELVIC COMPLETE WITH TRANSVAGINAL Resolved. We discussed possible etiologies and treatment options. Because her hx of CVD combination OCP are contraindicated. Progestin only OCP,depo provera,and IUD are a few options if problem is recurrent. Instructed about warning signs. Lab appt and transvaginal US will be arranged.  Essential hypertension Continue monitoring BP. No changes in Lisinopril or Amlodipine dose.  Uterine leiomyoma, unspecified location We discussed Dx,prognosis,and treatment options. Further recommendations will be given according to pelvic/transvaginal US.  I discussed the assessment and treatment plan with the patient. The patient was provided an opportunity to ask questions and all were answered. The patient agreed with the plan and demonstrated an understanding of the instructions.  Return if symptoms worsen or fail to improve, for Labs this week..  Jacqueline Walsh [name], MD

## 2021-06-19 ENCOUNTER — Other Ambulatory Visit: Payer: Self-pay

## 2021-06-19 ENCOUNTER — Other Ambulatory Visit (INDEPENDENT_AMBULATORY_CARE_PROVIDER_SITE_OTHER): Payer: BC Managed Care – PPO

## 2021-06-19 DIAGNOSIS — N938 Other specified abnormal uterine and vaginal bleeding: Secondary | ICD-10-CM | POA: Diagnosis not present

## 2021-06-19 LAB — CBC WITH DIFFERENTIAL/PLATELET
Basophils Absolute: 0.1 10*3/uL (ref 0.0–0.1)
Basophils Relative: 1.1 % (ref 0.0–3.0)
Eosinophils Absolute: 0.1 10*3/uL (ref 0.0–0.7)
Eosinophils Relative: 1.9 % (ref 0.0–5.0)
HCT: 28.9 % — ABNORMAL LOW (ref 36.0–46.0)
Hemoglobin: 9.8 g/dL — ABNORMAL LOW (ref 12.0–15.0)
Lymphocytes Relative: 43.7 % (ref 12.0–46.0)
Lymphs Abs: 2.1 10*3/uL (ref 0.7–4.0)
MCHC: 34.1 g/dL (ref 30.0–36.0)
MCV: 90.4 fl (ref 78.0–100.0)
Monocytes Absolute: 0.5 10*3/uL (ref 0.1–1.0)
Monocytes Relative: 11.3 % (ref 3.0–12.0)
Neutro Abs: 2 10*3/uL (ref 1.4–7.7)
Neutrophils Relative %: 42 % — ABNORMAL LOW (ref 43.0–77.0)
Platelets: 240 10*3/uL (ref 150.0–400.0)
RBC: 3.2 Mil/uL — ABNORMAL LOW (ref 3.87–5.11)
RDW: 14.5 % (ref 11.5–15.5)
WBC: 4.8 10*3/uL (ref 4.0–10.5)

## 2021-06-19 LAB — TSH: TSH: 1.03 u[IU]/mL (ref 0.35–5.50)

## 2021-06-22 ENCOUNTER — Other Ambulatory Visit: Payer: BC Managed Care – PPO

## 2021-06-22 ENCOUNTER — Ambulatory Visit
Admission: RE | Admit: 2021-06-22 | Discharge: 2021-06-22 | Disposition: A | Discharge status: Home / Self Care | Payer: BC Managed Care – PPO | Source: Ambulatory Visit | Attending: Family Medicine | Admitting: Family Medicine

## 2021-06-22 DIAGNOSIS — N938 Other specified abnormal uterine and vaginal bleeding: Secondary | ICD-10-CM

## 2021-06-22 IMAGING — US US PELVIS COMPLETE WITH TRANSVAGINAL
1 series · 13 of 25 positions shown · non-contrast
Comparison: None

CLINICAL DATA: Dysfunctional uterine bleeding, 2 weeks of heavy
vaginal bleeding with clots, history of fibroids; no LMP provided;
[OZ]

EXAM:
TRANSABDOMINAL AND TRANSVAGINAL ULTRASOUND OF PELVIS
TECHNIQUE: Both transabdominal and transvaginal ultrasound examinations of the
pelvis were performed. Transabdominal technique was performed for
global imaging of the pelvis including uterus, ovaries, adnexal
regions, and pelvic cul-de-sac. It was necessary to proceed with
endovaginal exam following the transabdominal exam to visualize the
uterus and ovaries.

[Series 1: us pelvis complete with transvaginal · 0.22mm/px · 13 of 65 slices shown]
[im 1/65]
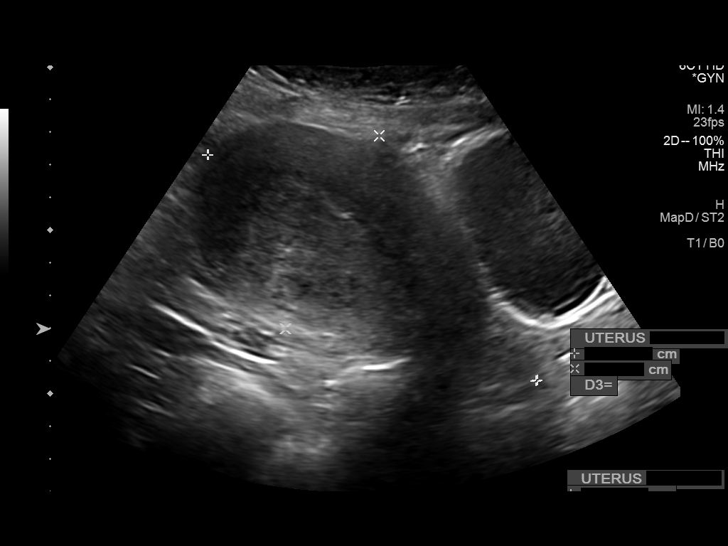
[im 6/65]
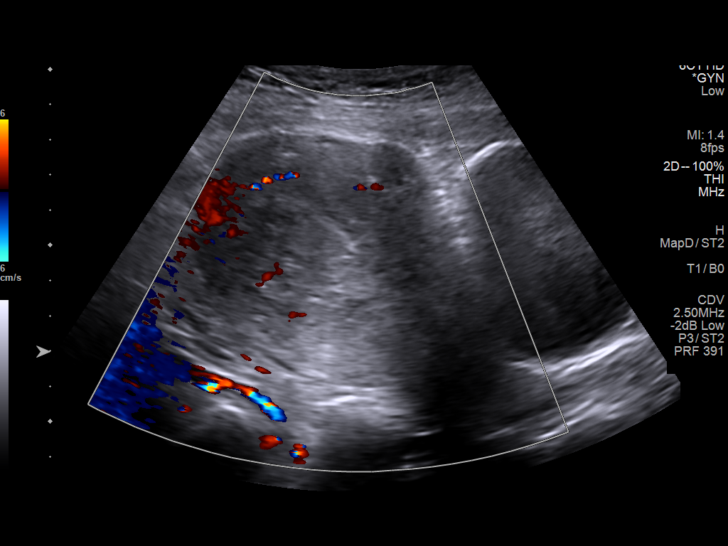
[im 11/65]
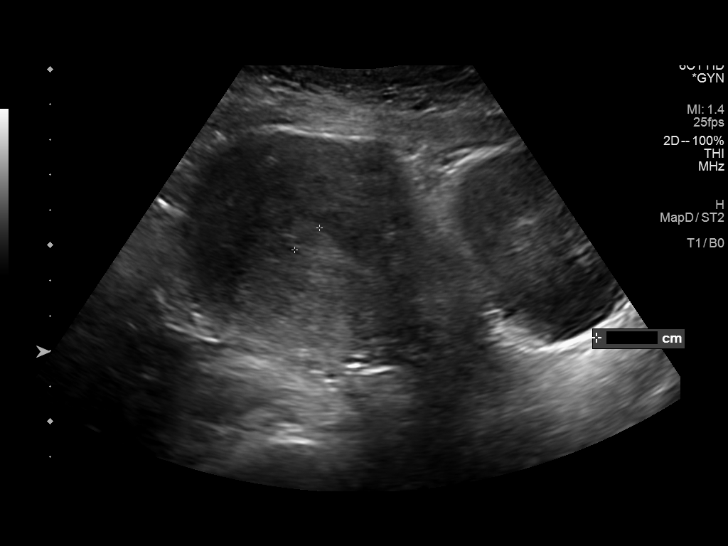
[im 17/65]
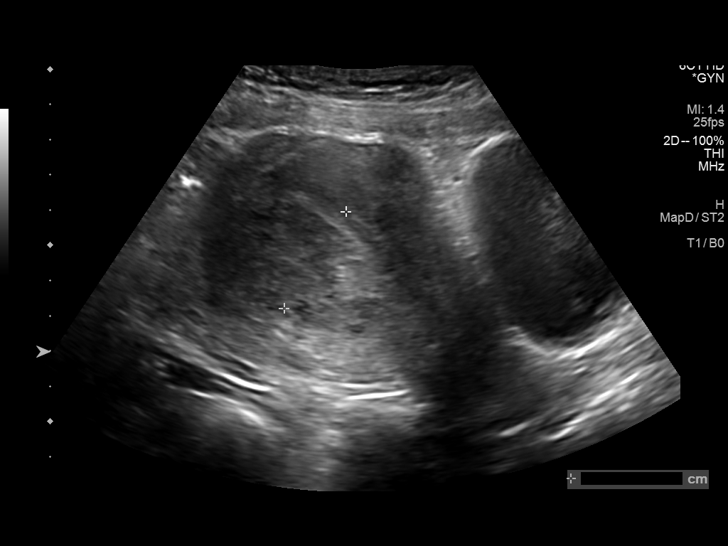
[im 22/65]
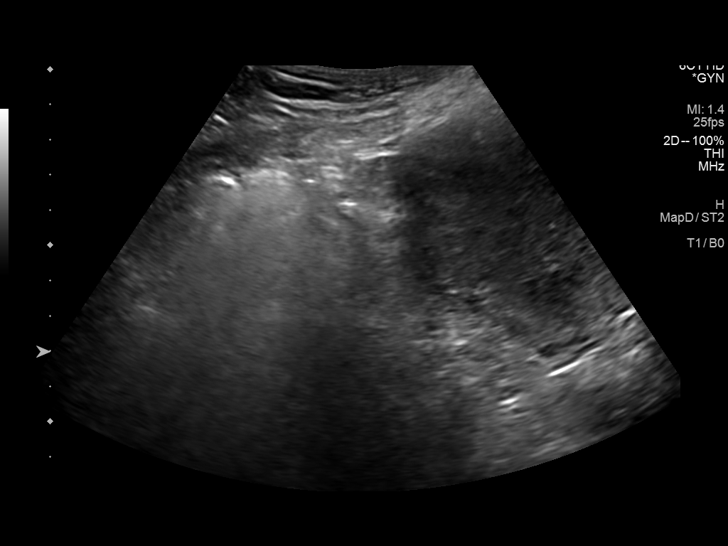
[im 27/65]
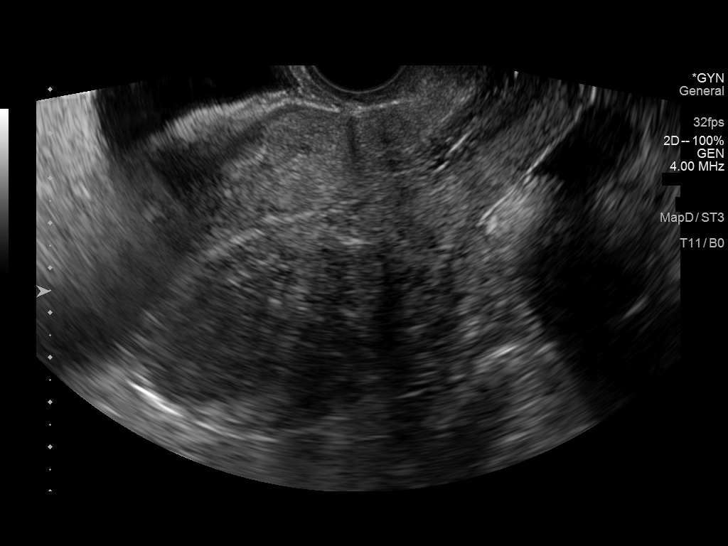
[im 33/65]
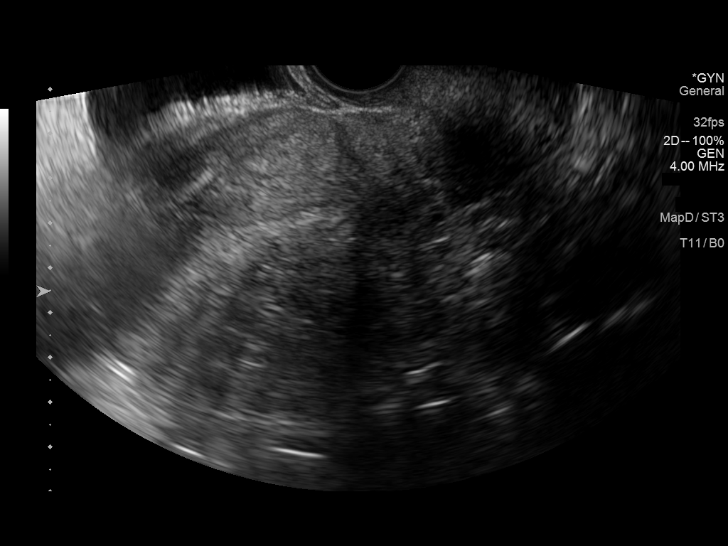
[im 38/65]
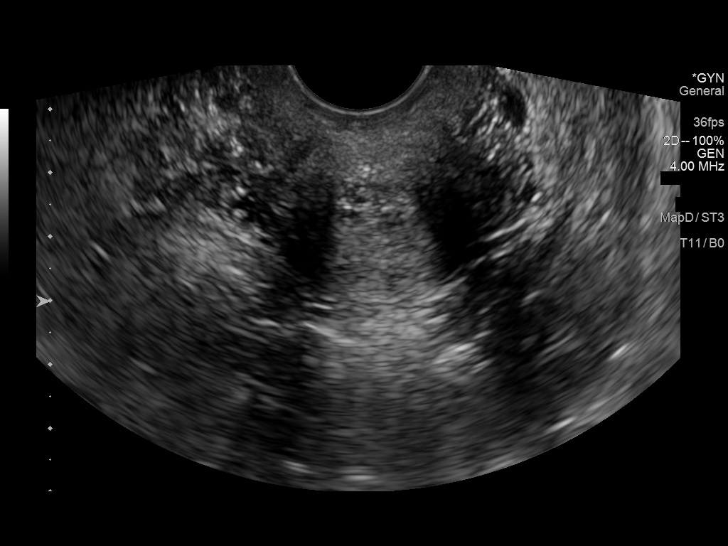
[im 43/65]
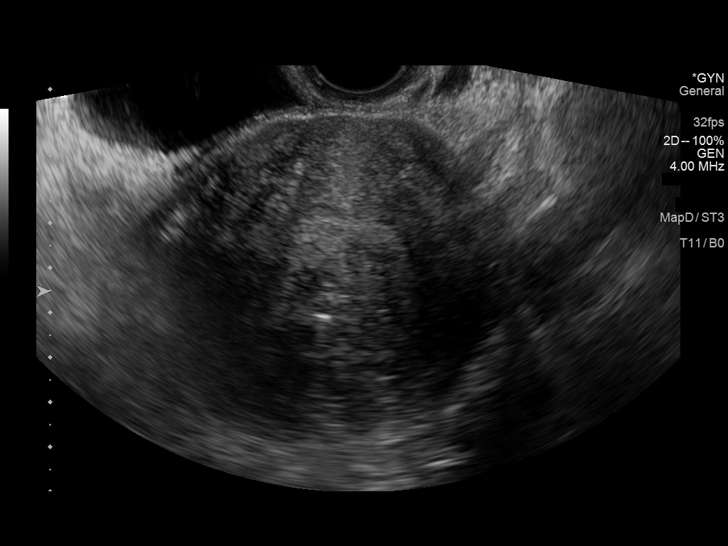
[im 49/65]
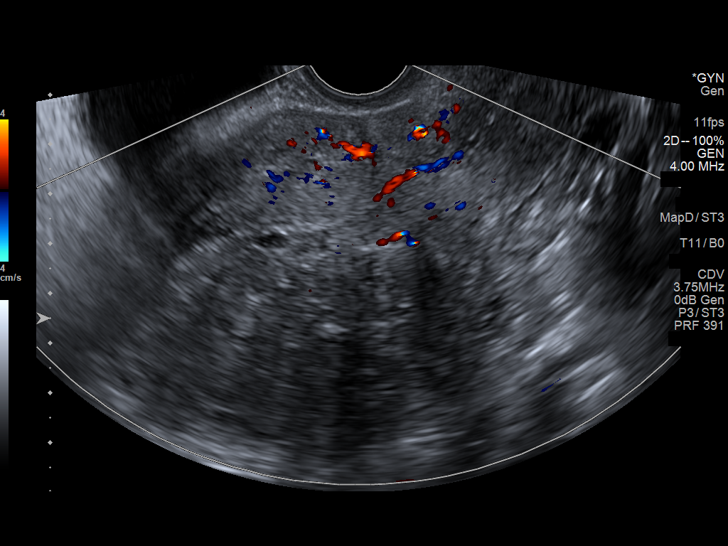
[im 54/65]
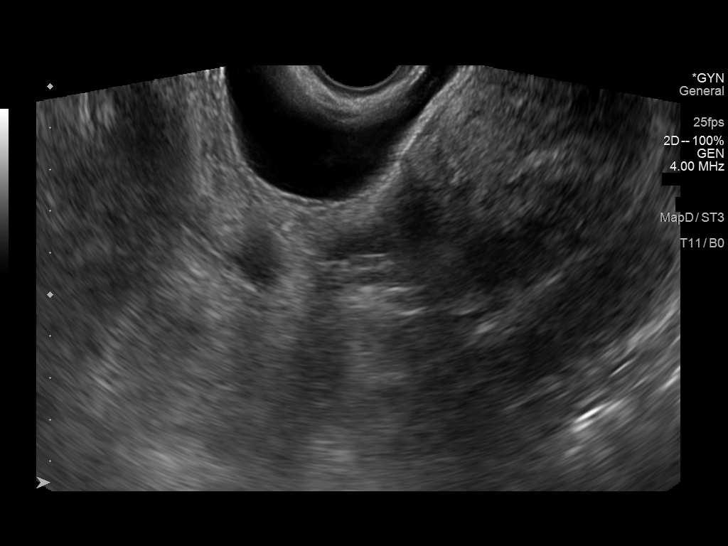
[im 59/65]
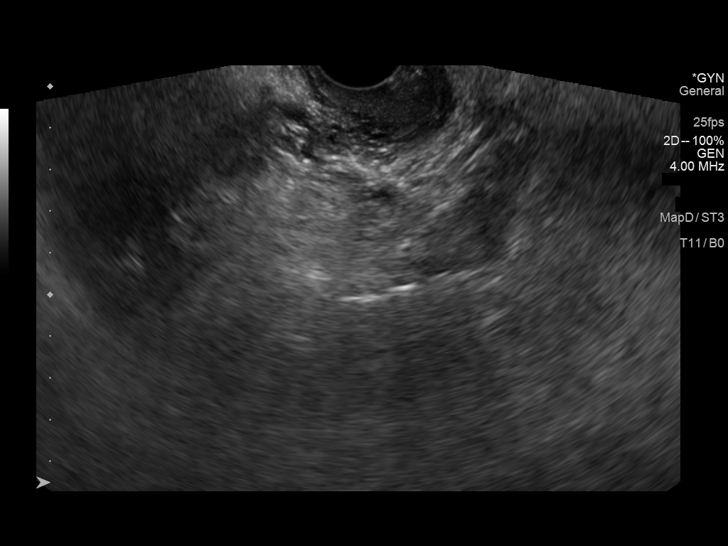
[im 65/65]
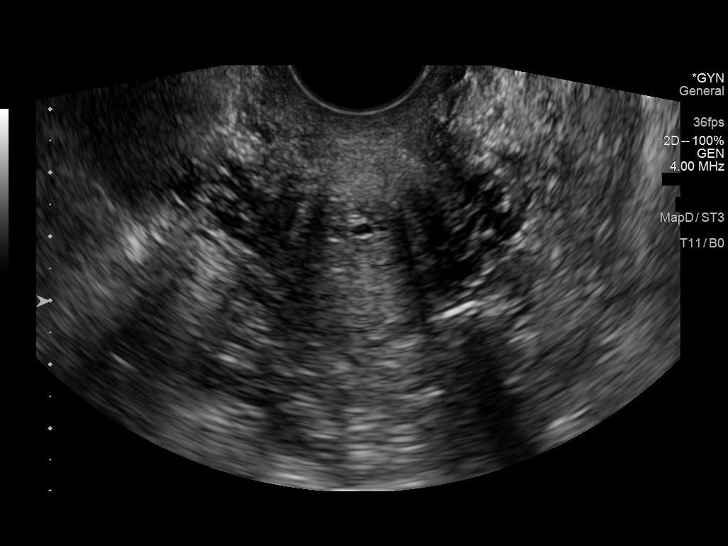

[13 of 25 positions shown; findings below may reference images not displayed]

FINDINGS: Uterus

Measurements: 12.2 x 6.6 x 8.0 cm = volume: 335 mL. Anteverted.
Heterogeneous myometrium. Heterogeneous area of echogenicity more
focally seen posterior to the endometrial complex at the mid to
upper uterus, raising question of a subtle 4.2 cm diameter
submucosal leiomyoma. No additional masses.

Endometrium

Thickness: 10 mm.  No endometrial fluid or mass

Right ovary

Not visualized, likely obscured by bowel

Left ovary

Not visualized, likely obscured by bowel

Other findings

No free pelvic fluid.  No adnexal masses.
IMPRESSION: Heterogeneous uterus with question 4.2 cm diameter submucosal
leiomyoma at posterior mid upper uterus.

Remainder of exam unremarkable.

## 2021-06-26 ENCOUNTER — Other Ambulatory Visit: Payer: Self-pay

## 2021-06-26 DIAGNOSIS — D219 Benign neoplasm of connective and other soft tissue, unspecified: Secondary | ICD-10-CM

## 2021-07-23 ENCOUNTER — Other Ambulatory Visit (HOSPITAL_COMMUNITY)
Admission: RE | Admit: 2021-07-23 | Discharge: 2021-07-23 | Disposition: A | Discharge status: Home / Self Care | Payer: BC Managed Care – PPO | Source: Ambulatory Visit | Attending: Family Medicine | Admitting: Family Medicine

## 2021-07-23 ENCOUNTER — Encounter: Payer: Self-pay | Admitting: Family Medicine

## 2021-07-23 ENCOUNTER — Other Ambulatory Visit: Payer: Self-pay

## 2021-07-23 ENCOUNTER — Ambulatory Visit: Payer: BC Managed Care – PPO | Admitting: Family Medicine

## 2021-07-23 VITALS — BP 128/70 | HR 100 | Resp 16 | Ht 66.0 in | Wt 151.0 lb

## 2021-07-23 DIAGNOSIS — D219 Benign neoplasm of connective and other soft tissue, unspecified: Secondary | ICD-10-CM | POA: Diagnosis not present

## 2021-07-23 DIAGNOSIS — N938 Other specified abnormal uterine and vaginal bleeding: Secondary | ICD-10-CM | POA: Insufficient documentation

## 2021-07-23 DIAGNOSIS — D509 Iron deficiency anemia, unspecified: Secondary | ICD-10-CM | POA: Diagnosis not present

## 2021-07-23 LAB — POCT URINE PREGNANCY: Preg Test, Ur: NEGATIVE

## 2021-07-23 MED ORDER — MEDROXYPROGESTERONE ACETATE 150 MG/ML IM SUSP
150.0000 mg | Freq: Once | INTRAMUSCULAR | Status: AC
  Administered 2021-07-23: 150 mg via INTRAMUSCULAR

## 2021-07-23 NOTE — Progress Notes (Signed)
Chief Complaint  Patient presents with   Menorrhagia    Still having bleeding, unable to get into GYN until 11/15   HPI: [name] is a 50 y.o. female, who is here today c/o worsening vaginal bleeding.  She has had problem for 11 days, "clotting really bad" Daily vaginal bleeding. She has not identified exacerbating or alleviating factors.  Sexually active. She uses condoms. No associated fever, chills, pelvic pain, nausea, vomiting, vaginal discharge, or urinary symptoms.  Pelvic/transvaginal US on 06/22/21:  Heterogeneous uterus with question 4.2 cm diameter submucosal leiomyoma at posterior mid upper uterus.  Remainder of exam unremarkable.  Iron deficiency anemia: She is on ferrous sulfate 325 mg daily. She has not noted nose/gum bleeding, blood in the stool, melena, or gross hematuria. Negative for CP, dyspnea, palpitation, and diaphoresis.  CVA on Plavix 75 mg daily.  Lab Results  Component Value Date   WBC 4.8 06/19/2021   HGB 9.8 (L) 06/19/2021   HCT 28.9 (L) 06/19/2021   MCV 90.4 06/19/2021   PLT 240.0 06/19/2021   Lab Results  Component Value Date   TSH 1.03 06/19/2021   Review of Systems  Constitutional:  Positive for activity change and fatigue. Negative for appetite change.  Respiratory:  Negative for cough and wheezing.   Gastrointestinal:  Negative for abdominal pain.       Negative for changes in bowel habits  Genitourinary:  Negative for decreased urine volume and dysuria.  Skin:  Positive for pallor. Negative for rash.  Neurological:  Negative for syncope, weakness and headaches.  Rest see pertinent positives and negatives per HPI.  Current Outpatient Medications on File Prior to Visit  Medication Sig Dispense Refill   amLODipine (NORVASC) 5 MG tablet Take 1 tablet (5 mg total) by mouth at bedtime. 90 tablet 3   atorvastatin (LIPITOR) 80 MG tablet Take 1 tablet (80 mg total) by mouth daily. 90 tablet 2   busPIRone (BUSPAR) 5 MG  tablet 1 tab am and 2 tabs pm. 180 tablet 1   clopidogrel (PLAVIX) 75 MG tablet Take 1 tablet (75 mg total) by mouth daily. 30 tablet 5   lisinopril (ZESTRIL) 20 MG tablet Take 1 tablet (20 mg total) by mouth daily. 90 tablet 2   Multiple Vitamins-Minerals (MULTIVITAMIN GUMMIES ADULT PO) Take 2 tablets by mouth daily. 2 gummies daily     No current facility-administered medications on file prior to visit.   Past Medical History:  Diagnosis Date   Hypertension    Stroke ([place])    No Known Allergies  Social History   Socioeconomic History   Marital status: Single    Spouse name: Not on file   Number of children: 2   Years of education: Not on file   Highest education level: Not on file  Occupational History    Comment: works at [employer]   Tobacco Use   Smoking status: Never   Smokeless tobacco: Never  Substance and Sexual Activity   Alcohol use: Not Currently   Drug use: Never   Sexual activity: Not on file  Other Topics Concern   Not on file  Social History Narrative   Jacqueline Medina is a 50 year old patient who works full time at [employer] 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 She lives with and has support of her daughters (67 & 31). She is independent with care needs and denies issues with transportation to medical appointments    Right  handed   Social Determinants of Health   Financial Resource Strain: Not on file  Food Insecurity: Not on file  Transportation Needs: Not on file  Physical Activity: Not on file  Stress: Not on file  Social Connections: Not on file   Vitals:   07/23/21 1413  BP: 128/70  Pulse: 100  Resp: 16  SpO2: 99%   Body mass index is 24.37 kg/m.  Physical Exam Vitals and nursing note reviewed. Exam conducted with a chaperone present.  Constitutional:      General: She is not in acute distress.    Appearance: She is well-developed.  HENT:     Head: Normocephalic and atraumatic.  Eyes:     Conjunctiva/sclera:  Conjunctivae normal.  Cardiovascular:     Rate and Rhythm: Normal rate and regular rhythm.     Heart sounds: No murmur heard. Pulmonary:     Effort: Pulmonary effort is normal. No respiratory distress.     Breath sounds: Normal breath sounds.  Abdominal:     Palpations: Abdomen is soft. There is no hepatomegaly or mass.     Tenderness: There is no abdominal tenderness.  Genitourinary:    General: Normal vulva.     Exam position: Lithotomy position.     Pubic Area: No rash.      Vagina: Bleeding (Moderate, no blood clots appreciated.) present. No erythema, tenderness or prolapsed vaginal walls.     Cervix: Cervical bleeding present. No cervical motion tenderness, discharge, friability or erythema.     Uterus: Enlarged. Not tender.   Lymphadenopathy:     Lower Body: No right inguinal adenopathy. No left inguinal adenopathy.  Skin:    General: Skin is warm.     Findings: No erythema or rash.  Neurological:     General: No focal deficit present.     Mental Status: She is alert and oriented to person, place, and time.     Cranial Nerves: No cranial nerve deficit.     Gait: Gait normal.   ASSESSMENT AND PLAN:  [name] was seen today for menorrhagia.  Diagnoses and all orders for this visit: Orders Placed This Encounter  Procedures   CBC   POCT urine pregnancy   Lab Results  Component Value Date   WBC 9.0 07/23/2021   HGB 8.0 Repeated and verified X2. (LL) 07/23/2021   HCT 23.9 Repeated and verified X2. (LL) 07/23/2021   MCV 92.0 07/23/2021   PLT 193.0 07/23/2021   DUB (dysfunctional uterine bleeding) Moderate to severe vaginal bleeding causing anemia. We discussed treatment options available at this time. Because history of CVA combination OCPs are contraindicated. We discussed some side effects of progesterone only treatments as well as risk of complications if she continues with heavy vaginal bleeding. After understanding possible side effects, which include worsening  bleeding, she agrees with Depo-Provera injection 150 mg IM x1. Keep appointment with gynecologist. She was clearly instructed about warning signs.  -     medroxyPROGESTERone (DEPO-PROVERA) injection 150 mg -     CBC  Fibroid We reviewed recent pelvic/transvaginal ultrasound report. Discussed Dx's, prognosis, and treatment options. Keep appointment with gynecologist.  -     medroxyPROGESTERone (DEPO-PROVERA) injection 150 mg  Iron deficiency anemia, unspecified iron deficiency anemia type Mildly symptomatic. Continue daily iron supplementation. Further recommendation will be given according to CBC, she understand that if H/H is < 8 she may need to go to the ER for blood transfusion.  Return if symptoms worsen or fail to improve.  Madgeline Rayo G. [name], MD  [facility]. [place] office.

## 2021-07-23 NOTE — Patient Instructions (Addendum)
A few things to remember from today's visit:  DUB (dysfunctional uterine bleeding) - Plan: CBC, POCT urine pregnancy, Cervicovaginal ancillary only  Fibroid - Plan: Cervicovaginal ancillary only  Iron deficiency anemia, unspecified iron deficiency anemia type - Plan: CBC  If you need refills please call your pharmacy. Do not use My Chart to request refills or for acute issues that need immediate attention.   Please be sure medication list is accurate. If a new problem present, please set up appointment sooner than planned today.  Today we gave you a Depo shot to help with bleeding. If anemia is bad, you may need a blood transfusion. Continue iron. Keep appt with gynecologist.  Abnormal Uterine Bleeding Abnormal uterine bleeding means bleeding more than usual from your womb (uterus). It can include: Bleeding between menstrual periods. Bleeding after sex. Bleeding that is heavier than normal. Menstrual periods that last longer than usual. Bleeding after you have stopped having your menstrual period (menopause). There are many problems that may cause this. You should see a doctor for any kind of bleeding that is not normal. Treatment depends on the cause of the bleeding. Follow these instructions at home: Medicines Take over-the-counter and prescription medicines only as told by your doctor. Tell your doctor about other medicines that you take. If told by your doctor, stop taking aspirin or medicines that have aspirin in them. These medicines can make you bleed more. You may be given iron pills to replace iron that your body loses because of this condition. Take them as told by your doctor. Managing constipation If you are taking iron pills, you may have trouble pooping (constipation). To prevent or treat trouble pooping, you may need to: Drink enough fluid to keep your pee (urine) pale yellow. Take over-the-counter or prescription medicines. Eat foods that are high in fiber. These  include beans, whole grains, and fresh fruits and vegetables. Limit foods that are high in fat and sugar. These include fried or sweet foods. General instructions Watch your condition for any changes. Do not use tampons, douche, or have sex, if your doctor tells you not to. Change your pads often. Get regular exams. This includes pelvic exams and cervical cancer screenings. It is up to you to get the results of any tests that are done. Ask your doctor, or the department that is doing the tests, when your results will be ready. Keep all follow-up visits as told by your doctor. This is important. Contact a doctor if: The bleeding lasts more than 1 week. You feel dizzy at times. You feel like you may vomit (nausea). You vomit. You feel light-headed or weak. Your symptoms get worse. Get help right away if: You pass out. You have to change pads every hour. You have pain in your belly. You have a fever or chills. You get sweaty. You get weak. You pass large blood clots from your vagina. Summary Abnormal uterine bleeding means bleeding more than usual from your womb (uterus). Any kind of bleeding that is not normal should be checked by a doctor. Treatment depends on the cause of the bleeding. Get help right away if you pass out, you have to change pads every hour, or you pass large blood clots from your vagina. This information is not intended to replace advice given to you by your health care provider. Make sure you discuss any questions you have with your health care provider. Document Revised: 07/13/2019 Document Reviewed: 07/13/2019 Elsevier Patient Education  [place].

## 2021-07-24 ENCOUNTER — Observation Stay (HOSPITAL_COMMUNITY): Payer: BC Managed Care – PPO

## 2021-07-24 ENCOUNTER — Observation Stay (HOSPITAL_COMMUNITY)
Admission: EM | Admit: 2021-07-24 | Discharge: 2021-07-26 | Disposition: A | Discharge status: Home / Self Care | Payer: BC Managed Care – PPO | Attending: Emergency Medicine | Admitting: Emergency Medicine

## 2021-07-24 ENCOUNTER — Encounter: Payer: Self-pay | Admitting: Family Medicine

## 2021-07-24 ENCOUNTER — Emergency Department (HOSPITAL_COMMUNITY): Payer: BC Managed Care – PPO

## 2021-07-24 ENCOUNTER — Telehealth: Payer: Self-pay | Admitting: Family Medicine

## 2021-07-24 DIAGNOSIS — D649 Anemia, unspecified: Secondary | ICD-10-CM

## 2021-07-24 DIAGNOSIS — Z20822 Contact with and (suspected) exposure to covid-19: Secondary | ICD-10-CM | POA: Diagnosis not present

## 2021-07-24 DIAGNOSIS — R4789 Other speech disturbances: Secondary | ICD-10-CM | POA: Insufficient documentation

## 2021-07-24 DIAGNOSIS — Z8673 Personal history of transient ischemic attack (TIA), and cerebral infarction without residual deficits: Secondary | ICD-10-CM | POA: Insufficient documentation

## 2021-07-24 DIAGNOSIS — D62 Acute posthemorrhagic anemia: Secondary | ICD-10-CM | POA: Diagnosis not present

## 2021-07-24 DIAGNOSIS — N939 Abnormal uterine and vaginal bleeding, unspecified: Secondary | ICD-10-CM | POA: Diagnosis present

## 2021-07-24 DIAGNOSIS — I639 Cerebral infarction, unspecified: Secondary | ICD-10-CM | POA: Diagnosis present

## 2021-07-24 DIAGNOSIS — F419 Anxiety disorder, unspecified: Secondary | ICD-10-CM | POA: Diagnosis present

## 2021-07-24 DIAGNOSIS — Z7902 Long term (current) use of antithrombotics/antiplatelets: Secondary | ICD-10-CM | POA: Diagnosis not present

## 2021-07-24 DIAGNOSIS — I10 Essential (primary) hypertension: Secondary | ICD-10-CM | POA: Insufficient documentation

## 2021-07-24 DIAGNOSIS — R29898 Other symptoms and signs involving the musculoskeletal system: Secondary | ICD-10-CM | POA: Insufficient documentation

## 2021-07-24 DIAGNOSIS — Z79899 Other long term (current) drug therapy: Secondary | ICD-10-CM | POA: Insufficient documentation

## 2021-07-24 DIAGNOSIS — D219 Benign neoplasm of connective and other soft tissue, unspecified: Secondary | ICD-10-CM

## 2021-07-24 DIAGNOSIS — D509 Iron deficiency anemia, unspecified: Secondary | ICD-10-CM | POA: Diagnosis present

## 2021-07-24 LAB — IRON AND TIBC
Iron: 14 ug/dL — ABNORMAL LOW (ref 28–170)
Saturation Ratios: 3 % — ABNORMAL LOW (ref 10.4–31.8)
TIBC: 452 ug/dL — ABNORMAL HIGH (ref 250–450)
UIBC: 438 ug/dL

## 2021-07-24 LAB — CBC WITH DIFFERENTIAL/PLATELET
Abs Immature Granulocytes: 0.02 10*3/uL (ref 0.00–0.07)
Basophils Absolute: 0 10*3/uL (ref 0.0–0.1)
Basophils Relative: 1 %
Eosinophils Absolute: 0.1 10*3/uL (ref 0.0–0.5)
Eosinophils Relative: 1 %
HCT: 22 % — ABNORMAL LOW (ref 36.0–46.0)
Hemoglobin: 7.3 g/dL — ABNORMAL LOW (ref 12.0–15.0)
Immature Granulocytes: 0 %
Lymphocytes Relative: 24 %
Lymphs Abs: 1.4 10*3/uL (ref 0.7–4.0)
MCH: 31.1 pg (ref 26.0–34.0)
MCHC: 33.2 g/dL (ref 30.0–36.0)
MCV: 93.6 fL (ref 80.0–100.0)
Monocytes Absolute: 0.6 10*3/uL (ref 0.1–1.0)
Monocytes Relative: 10 %
Neutro Abs: 3.8 10*3/uL (ref 1.7–7.7)
Neutrophils Relative %: 64 %
Platelets: 220 10*3/uL (ref 150–400)
RBC: 2.35 MIL/uL — ABNORMAL LOW (ref 3.87–5.11)
RDW: 15 % (ref 11.5–15.5)
WBC: 5.9 10*3/uL (ref 4.0–10.5)
nRBC: 0 % (ref 0.0–0.2)

## 2021-07-24 LAB — CBC
HCT: 23.9 % — CL (ref 36.0–46.0)
Hemoglobin: 8 g/dL — CL (ref 12.0–15.0)
MCHC: 33.6 g/dL (ref 30.0–36.0)
MCV: 92 fl (ref 78.0–100.0)
Platelets: 193 10*3/uL (ref 150.0–400.0)
RBC: 2.59 Mil/uL — ABNORMAL LOW (ref 3.87–5.11)
RDW: 14.7 % (ref 11.5–15.5)
WBC: 9 10*3/uL (ref 4.0–10.5)

## 2021-07-24 LAB — COMPREHENSIVE METABOLIC PANEL
ALT: 15 U/L (ref 0–44)
AST: 19 U/L (ref 15–41)
Albumin: 3.2 g/dL — ABNORMAL LOW (ref 3.5–5.0)
Alkaline Phosphatase: 94 U/L (ref 38–126)
Anion gap: 4 — ABNORMAL LOW (ref 5–15)
BUN: 13 mg/dL (ref 6–20)
CO2: 25 mmol/L (ref 22–32)
Calcium: 8.3 mg/dL — ABNORMAL LOW (ref 8.9–10.3)
Chloride: 108 mmol/L (ref 98–111)
Creatinine, Ser: 0.82 mg/dL (ref 0.44–1.00)
GFR, Estimated: >60 mL/min (ref >60)
Glucose, Bld: 109 mg/dL — ABNORMAL HIGH (ref 70–99)
Potassium: 3.5 mmol/L (ref 3.5–5.1)
Sodium: 137 mmol/L (ref 135–145)
Total Bilirubin: 0.4 mg/dL (ref 0.3–1.2)
Total Protein: 5.9 g/dL — ABNORMAL LOW (ref 6.5–8.1)

## 2021-07-24 LAB — ABO/RH: ABO/RH(D): O POS

## 2021-07-24 LAB — RESP PANEL BY RT-PCR (FLU A&B, COVID) ARPGX2
Influenza A by PCR: NEGATIVE
Influenza B by PCR: NEGATIVE
SARS Coronavirus 2 by RT PCR: NEGATIVE

## 2021-07-24 LAB — RETICULOCYTES
Immature Retic Fract: 31.8 % — ABNORMAL HIGH (ref 2.3–15.9)
RBC.: 2.29 MIL/uL — ABNORMAL LOW (ref 3.87–5.11)
Retic Count, Absolute: 130.5 10*3/uL (ref 19.0–186.0)
Retic Ct Pct: 5.7 % — ABNORMAL HIGH (ref 0.4–3.1)

## 2021-07-24 LAB — FERRITIN: Ferritin: 9 ng/mL — ABNORMAL LOW (ref 11–307)

## 2021-07-24 LAB — PREPARE RBC (CROSSMATCH)

## 2021-07-24 LAB — FOLATE: Folate: 30 ng/mL (ref >5.9)

## 2021-07-24 LAB — VITAMIN B12: Vitamin B-12: 455 pg/mL (ref 180–914)

## 2021-07-24 IMAGING — CT CT HEAD W/O CM
4 series · 17 of 47 positions shown, 19 images · non-contrast
Comparison: CT angiography of the head and neck [DATE].

CLINICAL DATA: 50-year-old female with new onset of neurologic
deficit. Possible stroke.

EXAM:
CT HEAD WITHOUT CONTRAST
TECHNIQUE: Contiguous axial images were obtained from the base of the skull
through the vertex without intravenous contrast.

[Series 3: head without · axial · non-contrast · 0.42mm/px · z∈[-120,+0]mm · 7 of 33 slices shown, 9 images]
[im 5/33  brain]
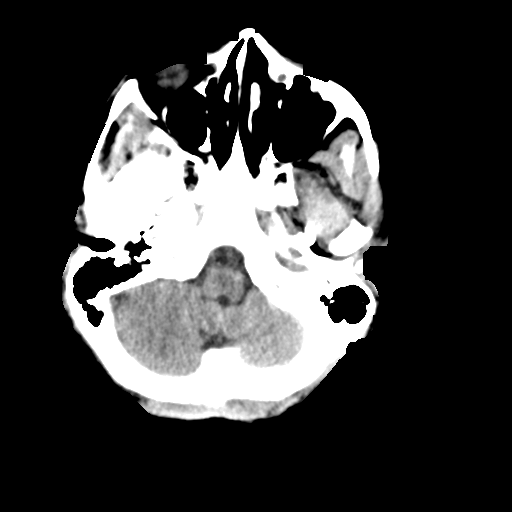
[im 5/33  bone]
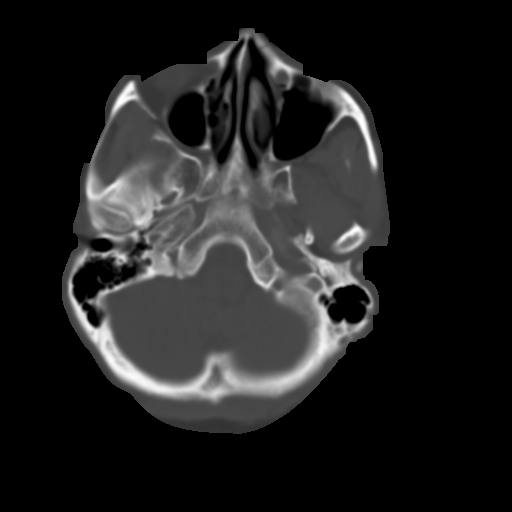
[im 9/33  brain]
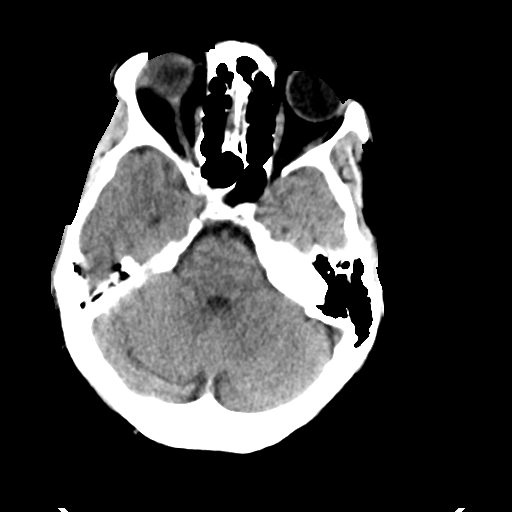
[im 13/33  brain]
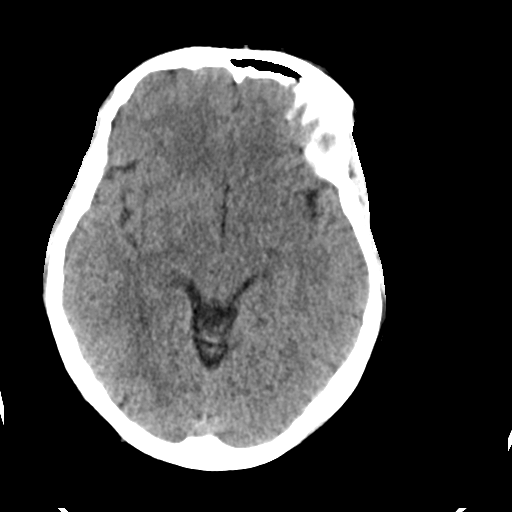
[im 17/33  brain]
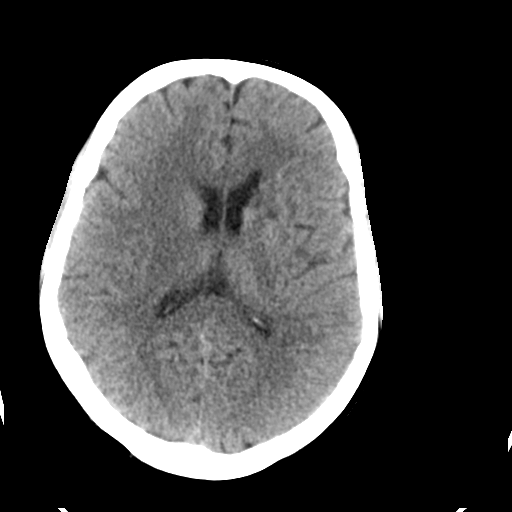
[im 21/33  brain]
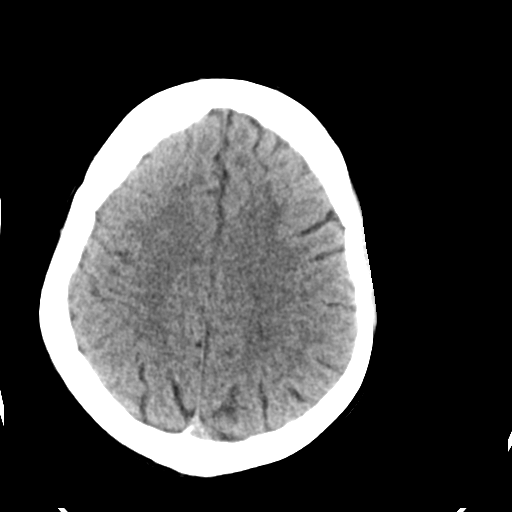
[im 21/33  bone]
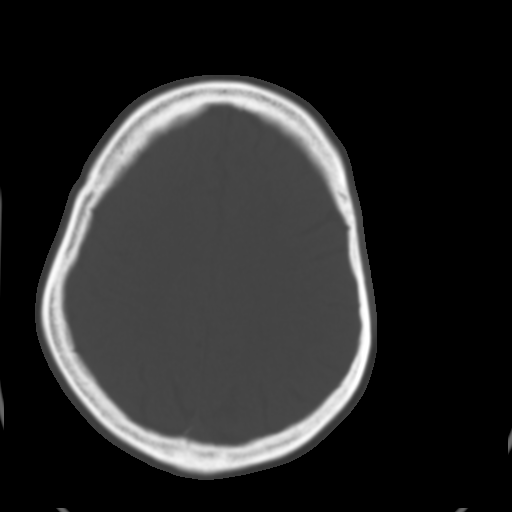
[im 25/33  brain]
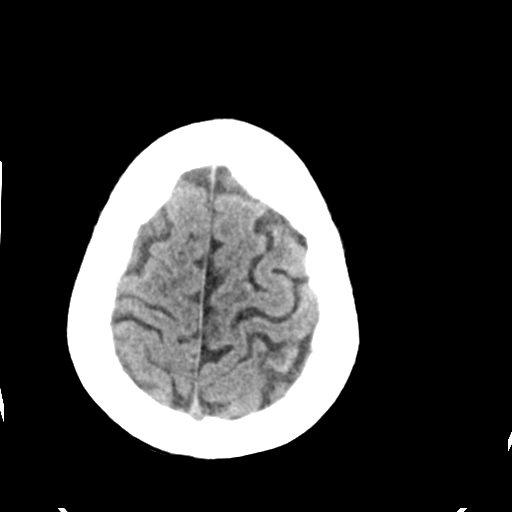
[im 29/33  brain]
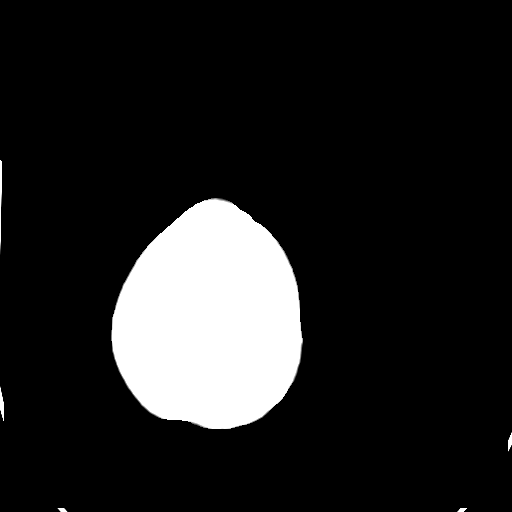

[Series 4: head bone · axial · 0.42mm/px · z∈[-124,-68]mm · 4 of 81 slices shown]
[im 9/81  bone]
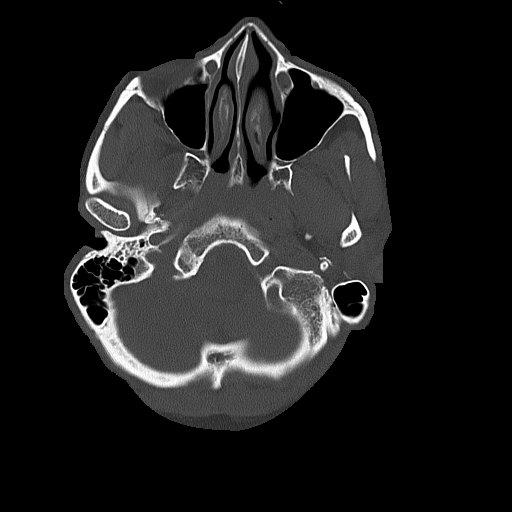
[im 17/81  bone]
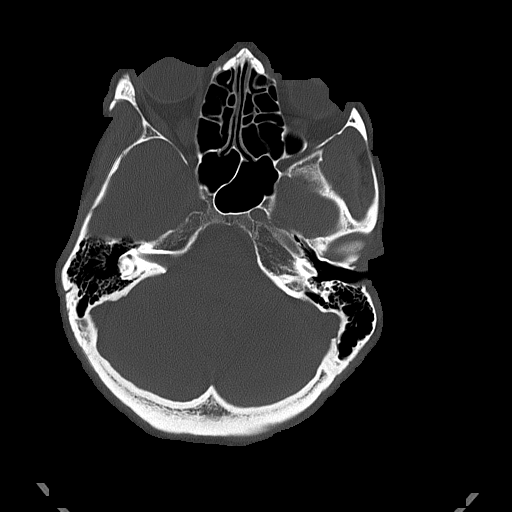
[im 25/81  bone]
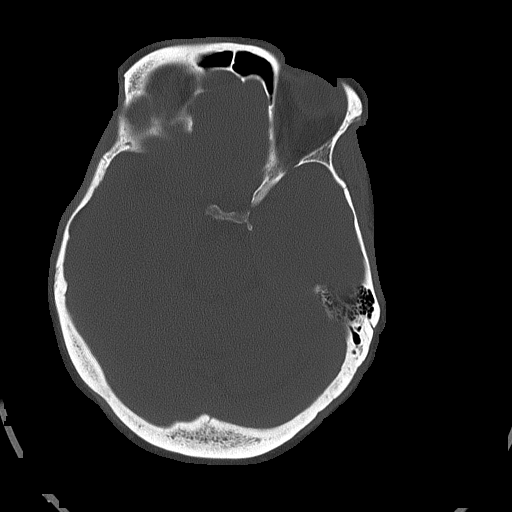
[im 37/81  bone]
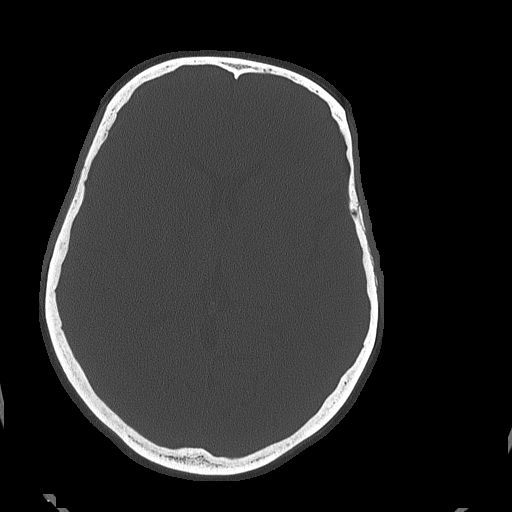

[Series 5: head without cor · coronal · non-contrast · 0.31mm/px · 3 of 66 slices shown]
[im 22/66  brain]
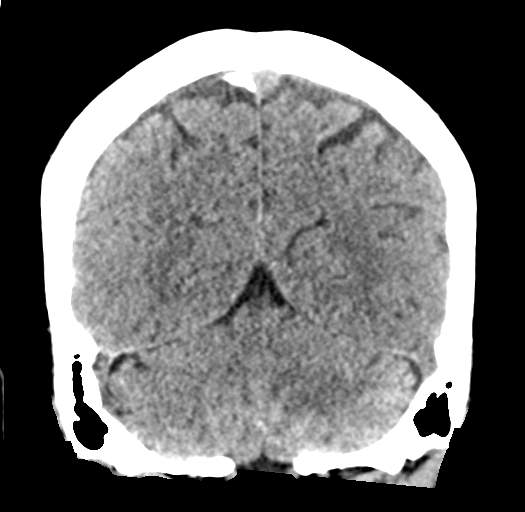
[im 29/66  brain]
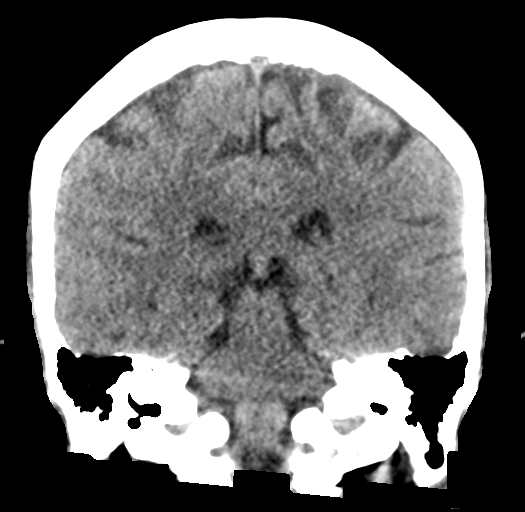
[im 37/66  brain]
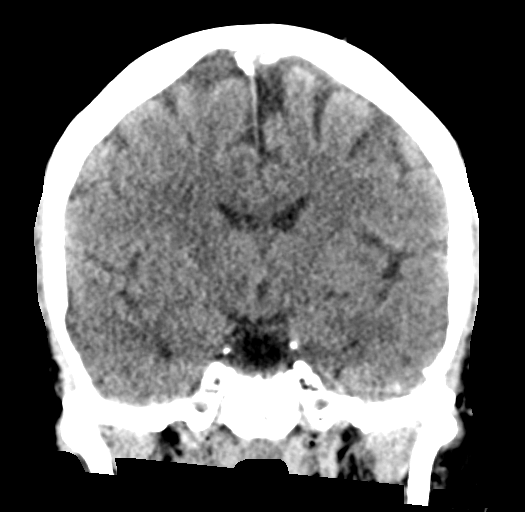

[Series 6: head without sag · sagittal · non-contrast · 0.31mm/px · 3 of 57 slices shown]
[im 19/57  brain]
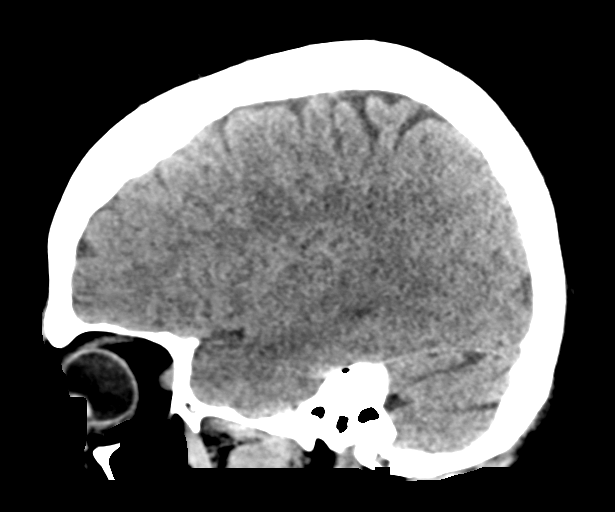
[im 29/57  brain]
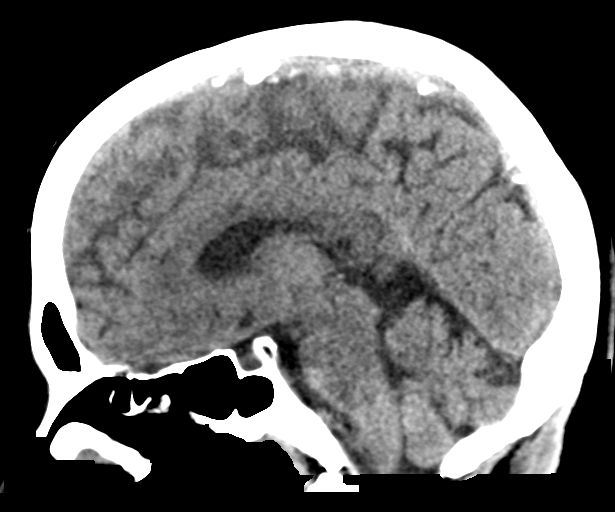
[im 38/57  brain]
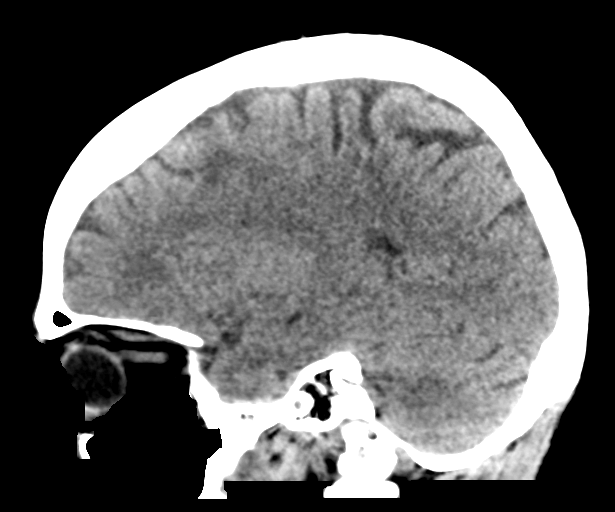

[17 of 47 positions shown; findings below may reference images not displayed]

FINDINGS: Brain: Several well-defined areas of low attenuation are again noted
in the left basal ganglia and left corona radiata, compatible with
old lacunar infarcts and chronic ischemic changes in the white
matter. No evidence of acute infarction, hemorrhage, hydrocephalus,
extra-axial collection or mass lesion/mass effect.

Vascular: No hyperdense vessel or unexpected calcification.

Skull: Normal. Negative for fracture or focal lesion.

Sinuses/Orbits: No acute finding.

Other: None.
IMPRESSION: 1. No acute intracranial abnormalities.
2. Chronic ischemic changes in the left basal ganglia and left
periventricular white matter, similar to the prior examination, as
above.

## 2021-07-24 IMAGING — MR MR HEAD W/O CM
12 of 13 series · 44 of 48 positions shown · non-contrast
Comparison: [DATE]

CLINICAL DATA: Neuro deficit, stroke suspected

EXAM:
MRI HEAD WITHOUT CONTRAST
TECHNIQUE: Multiplanar, multiecho pulse sequences of the brain and surrounding
structures were obtained without intravenous contrast.

[Series 5: DWI · axial · 3.0mm · 0.88mm/px · z∈[-116,+38]mm · 8 of 106 slices shown (1 of 4)]
[im 1/106]
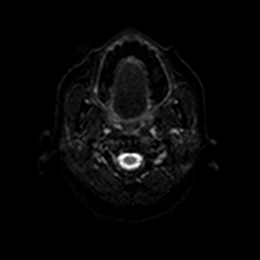
[im 16/106]
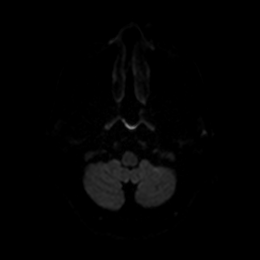
[im 31/106]
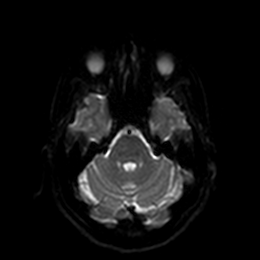
[im 46/106]
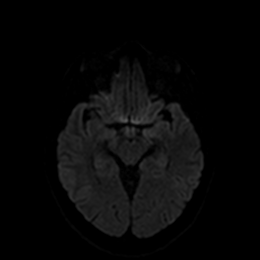
[im 61/106]
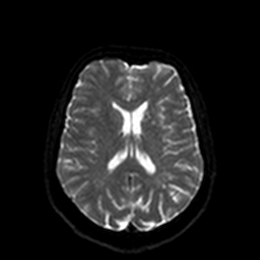
[im 76/106]
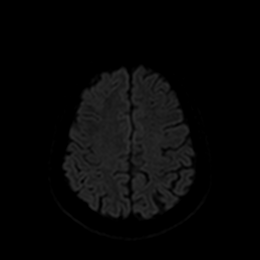
[im 91/106]
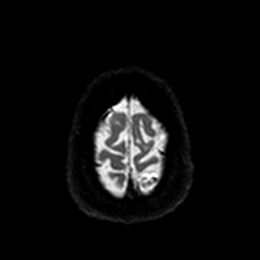
[im 106/106]
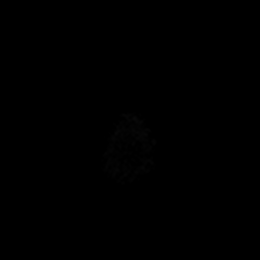

[Series 6: DWI · axial · 3.0mm · 0.88mm/px · z∈[-116,+38]mm · 4 of 53 slices shown (2 of 4)]
[im 1/53]
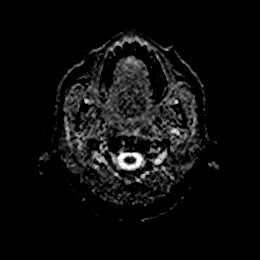
[im 18/53]
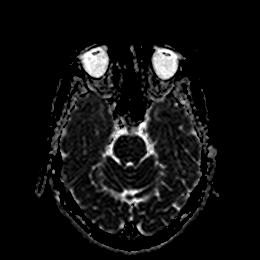
[im 35/53]
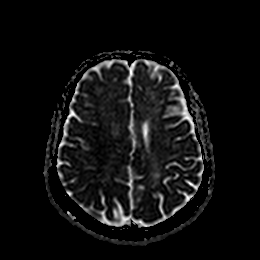
[im 53/53]
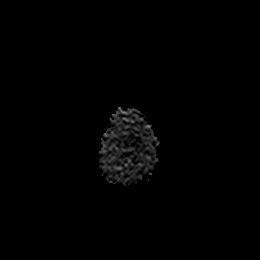

[Series 7: DWI · coronal · 4.0mm · 0.88mm/px · 5 of 68 slices shown (3 of 4)]
[im 1/68]
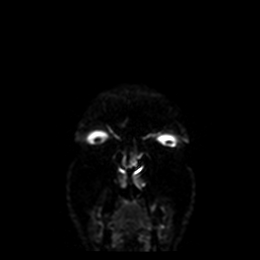
[im 17/68]
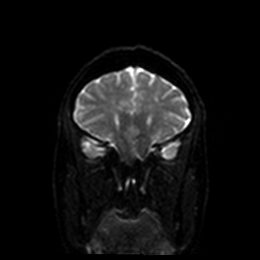
[im 34/68]
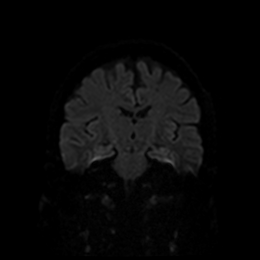
[im 51/68]
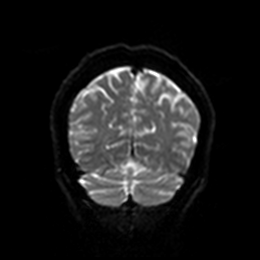
[im 68/68]
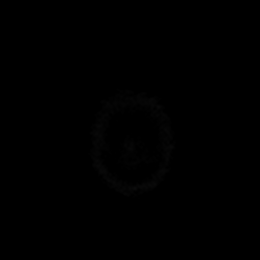

[Series 8: DWI · coronal · 4.0mm · 0.88mm/px · 3 of 34 slices shown (4 of 4)]
[im 1/34]
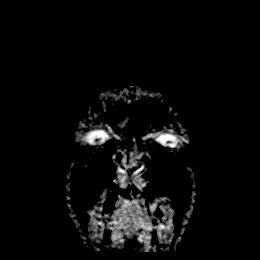
[im 17/34]
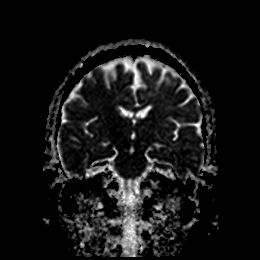
[im 34/34]
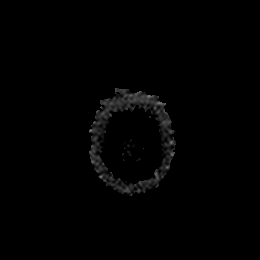

[Series 9: T1 · sagittal · 5.0mm · 0.75mm/px · 2 of 23 slices shown]
[im 1/23]
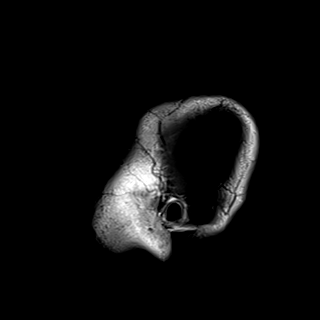
[im 23/23]
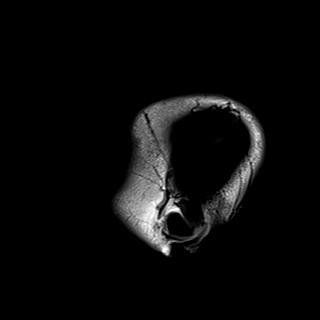

[Series 10: T2 · axial · 5.0mm · 0.72mm/px · z∈[-116,+38]mm · 2 of 27 slices shown (1 of 2)]
[im 1/27]
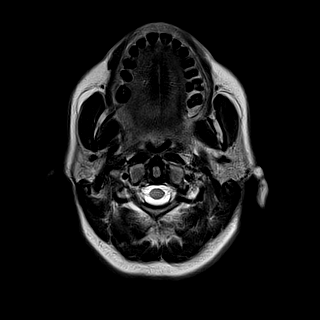
[im 27/27]
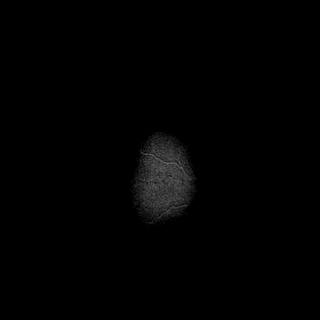

[Series 11: FLAIR · axial · 5.0mm · 0.45mm/px · z∈[-114,+41]mm · 2 of 27 slices shown]
[im 1/27]
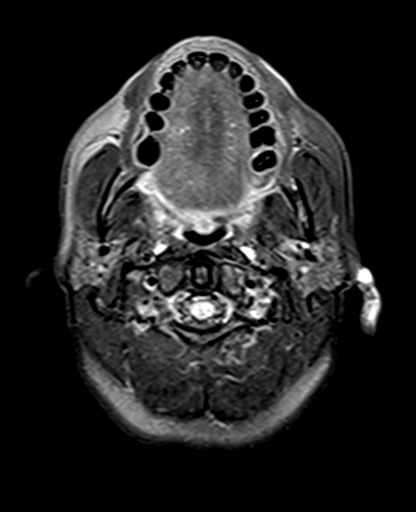
[im 27/27]
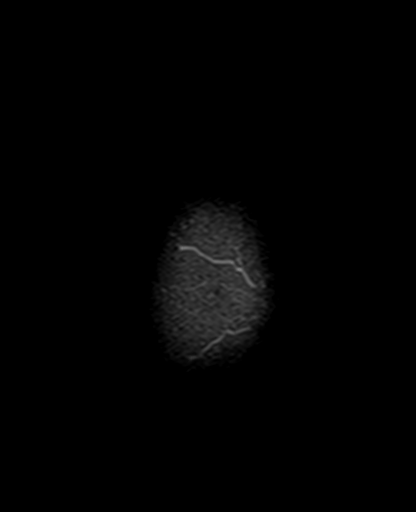

[Series 12: mag_images · axial · 3.0mm · 0.90mm/px · z∈[-118,+45]mm · 4 of 56 slices shown]
[im 1/56]
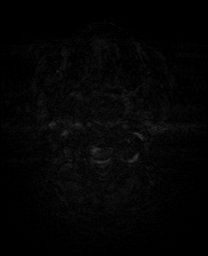
[im 19/56]
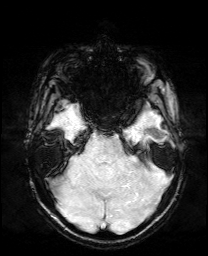
[im 37/56]
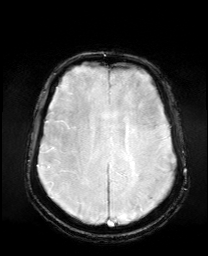
[im 56/56]
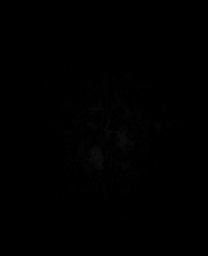

[Series 13: pha_images · axial · 3.0mm · 0.90mm/px · z∈[-115,+39]mm · 4 of 53 slices shown]
[im 1/53]
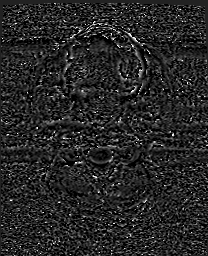
[im 18/53]
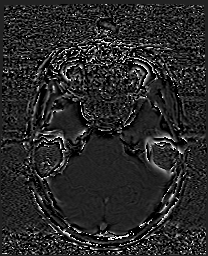
[im 35/53]
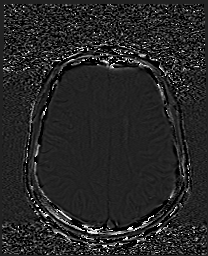
[im 53/53]
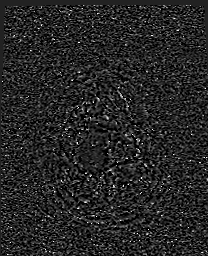

[Series 14: swi_images · axial · 3.0mm · 0.90mm/px · z∈[-118,+45]mm · 4 of 56 slices shown]
[im 1/56]
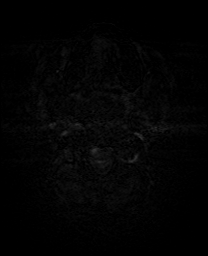
[im 19/56]
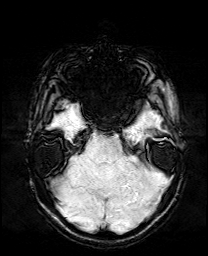
[im 37/56]
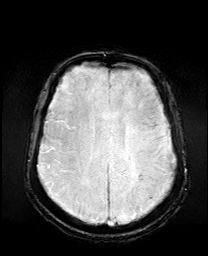
[im 56/56]
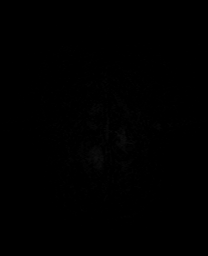

[Series 15: mip_images(sw) · axial · 24.0mm · 0.90mm/px · z∈[-108,+35]mm · 4 of 49 slices shown]
[im 1/49]
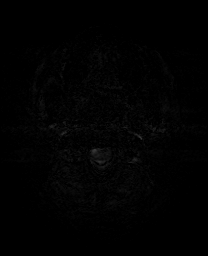
[im 17/49]
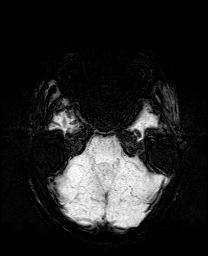
[im 33/49]
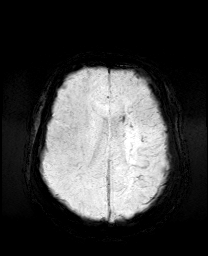
[im 49/49]
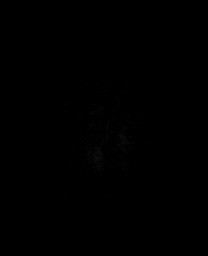

[Series 17: T2 · coronal · 5.0mm · 0.34mm/px · 2 of 29 slices shown (2 of 2)]
[im 1/29]
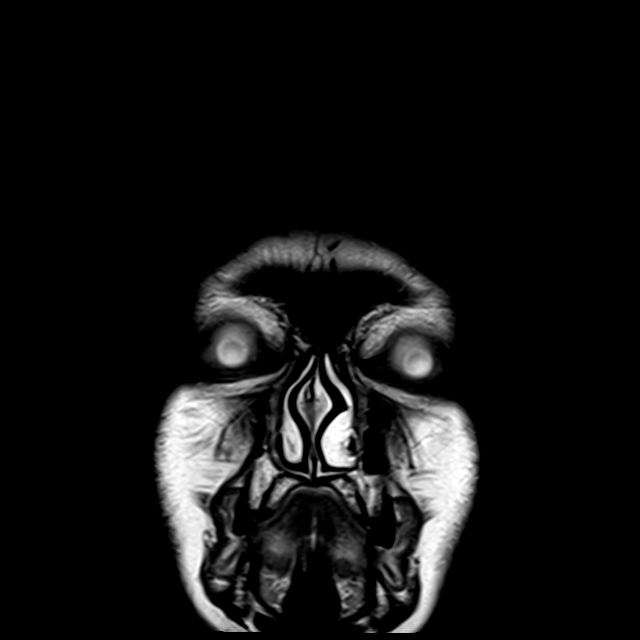
[im 29/29]
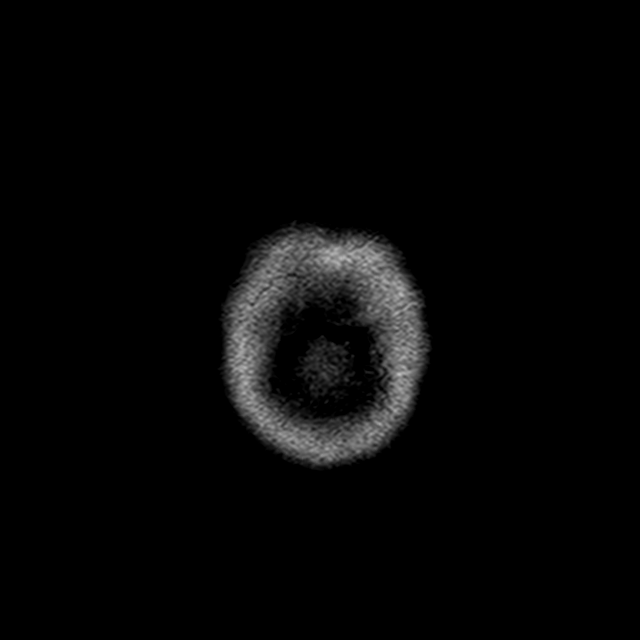

[44 of 48 positions shown; findings below may reference images not displayed]

FINDINGS: Brain: No restricted diffusion to suggest acute infarct. No
hemorrhage, mass, mass effect, or midline shift. No hydrocephalus or
extra-axial collection. T2 hyperintense signal in the
periventricular white matter, likely the sequela of moderate chronic
small vessel ischemic disease.

Vascular: Relatively diminutive left ICA and MCA, unchanged from the
prior exam. Otherwise normal flow voids.

Skull and upper cervical spine: Normal marrow signal.

Sinuses/Orbits: Negative.

Other: The mastoids are well aerated.
IMPRESSION: No acute intracranial process.

## 2021-07-24 MED ORDER — ADULT MULTIVITAMIN W/MINERALS CH
1.0000 | ORAL_TABLET | Freq: Every day | ORAL | Status: DC
  Administered 2021-07-25 – 2021-07-26 (×2): 1 via ORAL
  Filled 2021-07-24 (×2): qty 1

## 2021-07-24 MED ORDER — BUSPIRONE HCL 5 MG PO TABS
10.0000 mg | ORAL_TABLET | Freq: Every day | ORAL | Status: DC
  Administered 2021-07-25 (×2): 10 mg via ORAL
  Filled 2021-07-24: qty 2
  Filled 2021-07-24: qty 1

## 2021-07-24 MED ORDER — MEGESTROL ACETATE 20 MG PO TABS
20.0000 mg | ORAL_TABLET | Freq: Two times a day (BID) | ORAL | Status: DC
  Administered 2021-07-25 – 2021-07-26 (×4): 20 mg via ORAL
  Filled 2021-07-24 (×6): qty 1

## 2021-07-24 MED ORDER — AMLODIPINE BESYLATE 5 MG PO TABS
5.0000 mg | ORAL_TABLET | Freq: Every day | ORAL | Status: DC
  Administered 2021-07-25: 5 mg via ORAL
  Filled 2021-07-24: qty 1

## 2021-07-24 MED ORDER — ATORVASTATIN CALCIUM 80 MG PO TABS
80.0000 mg | ORAL_TABLET | Freq: Every day | ORAL | Status: DC
  Administered 2021-07-25 – 2021-07-26 (×2): 80 mg via ORAL
  Filled 2021-07-24 (×2): qty 1

## 2021-07-24 MED ORDER — BUSPIRONE HCL 5 MG PO TABS
5.0000 mg | ORAL_TABLET | Freq: Every morning | ORAL | Status: DC
  Administered 2021-07-25 – 2021-07-26 (×2): 5 mg via ORAL
  Filled 2021-07-24: qty 1

## 2021-07-24 MED ORDER — SODIUM CHLORIDE 0.9 % IV SOLN: 10.0000 mL/h | Freq: Once | INTRAVENOUS | Status: DC

## 2021-07-24 MED ORDER — ACETAMINOPHEN 650 MG RE SUPP: 650.0000 mg | Freq: Four times a day (QID) | RECTAL | Status: DC | PRN

## 2021-07-24 MED ORDER — BUSPIRONE HCL 10 MG PO TABS: 5.0000 mg | ORAL_TABLET | Freq: Two times a day (BID) | ORAL | Status: DC

## 2021-07-24 MED ORDER — SODIUM CHLORIDE 0.9 % IV SOLN: 250.0000 mL | INTRAVENOUS | Status: DC | PRN

## 2021-07-24 MED ORDER — SODIUM CHLORIDE 0.9% FLUSH
3.0000 mL | Freq: Two times a day (BID) | INTRAVENOUS | Status: DC
  Administered 2021-07-25: 3 mL via INTRAVENOUS

## 2021-07-24 MED ORDER — ACETAMINOPHEN 325 MG PO TABS: 650.0000 mg | ORAL_TABLET | Freq: Four times a day (QID) | ORAL | Status: DC | PRN

## 2021-07-24 MED ORDER — LISINOPRIL 20 MG PO TABS
20.0000 mg | ORAL_TABLET | Freq: Every day | ORAL | Status: DC
  Administered 2021-07-25 – 2021-07-26 (×2): 20 mg via ORAL
  Filled 2021-07-24 (×2): qty 1

## 2021-07-24 MED ORDER — SODIUM CHLORIDE 0.9% FLUSH: 3.0000 mL | INTRAVENOUS | Status: DC | PRN

## 2021-07-24 NOTE — ED Notes (Signed)
ED Provider at bedside. 

## 2021-07-24 NOTE — ED Notes (Signed)
Patient transported to MRI 

## 2021-07-24 NOTE — ED Provider Notes (Signed)
Emergency Medicine Provider Triage Evaluation Note  Jacqueline Medina , a 49 y.o. female  was evaluated in triage.  Pt complains of dragging right leg, slurring speech, last normal on Saturday, prior stroke in May. Also heavy vaginal bleeding, passing clots, PCP yesterday did CBC, results not available, told she may need a transfusion.   Review of Systems  Positive: Vaginal bleeding, change in speech, right leg weakness Negative: Numbness, CP, SHOB  Physical Exam  BP (!) 152/85 (BP Location: Right Arm)   Pulse 89   Temp 98.4 F (36.9 C) (Oral)   Resp 16   SpO2 100%  Gen:   Awake, no distress   Resp:  Normal effort  MSK:   Equal ext strength, sensation intact Other:    Medical Decision Making  Medically screening exam initiated at 9:48 AM.  Appropriate orders placed.  [name] was informed that the remainder of the evaluation will be completed by another provider, this initial triage assessment does not replace that evaluation, and the importance of remaining in the ED until their evaluation is complete.     Tacy Learn, PA-C 07/24/21 [id number]    Truddie Hidden, MD 07/24/21 [phone]

## 2021-07-24 NOTE — H&P (Signed)
History and Physical    Jacqueline Medina AYT:[id number] DOB: January 20, 1971 DOA: 07/24/2021  PCP: [name], [name], MD Consultants:  neurology: Dr. Leonie Man Patient coming from:  Home - lives with daughter and her son   Chief Complaint: vaginal bleeding, abnormal labs   HPI: Jacqueline Medina is a 50 y.o. female with medical history significant of HTN, hx of CVA, IDA, anxiety  presenting to Ed with 12 days of vaginal bleeding and abnormal labs. She also has complaints of her right leg being weaker than normal and slurred speech this AM. Her leg is still weak and her foot had a drag to it. She denies any back pain. She denies ay loss of sensation. She had no facial droop, but seemed to have a hard time finding her words. This has resolved.   She states last month she started to bleed very heavily, before this she had a period about 6 months ago. She states she is going through about 15 super pads/day. She states she got the depo shot yesterday and bleeding has let up some, but then this morning it was "gushing out" again. She has no abdominal pain. She has no pain with sex. She has no history of abnormal periods or abnormal/heavy bleeding. She has been fatigued.   Denies any fever/chills, chest pain, palpitations, shortness of breath cough, abdominal pain, N/V/D. Negative for headaches, vision changes. She has no pain with sex and no vaginal discharge.   Pelvic/transvaginal US on 06/22/21:  Heterogeneous uterus with question 4.2 cm diameter submucosal leiomyoma at posterior mid upper uterus  ED Course: vitals: Afebrile, blood pressure 152/85, heart rate 89, respiratory rate 16, oxygen 100% on room air. Pertinent labs: Hemoglobin 7.3, CT head chronic ischemic changes in the left basal ganglia and left periventricular white matter. In ED: MRI brain ordered. 1 unit blood ordered  [facility] was asked to admit.   Review of Systems: As per HPI; otherwise review of systems reviewed and negative.    Ambulatory Status:  Ambulates without assistance    Past Medical History:  Diagnosis Date   Hypertension    Stroke [facility])     Past Surgical History:  Procedure Laterality Date   CESAREAN SECTION  1992   IR ANGIO INTRA EXTRACRAN SEL COM CAROTID INNOMINATE BILAT MOD SED  02/08/2020   IR ANGIO VERTEBRAL SEL VERTEBRAL BILAT MOD SED  02/08/2020   IR US GUIDE VASC ACCESS RIGHT  02/08/2020    Social History   Socioeconomic History   Marital status: Single    Spouse name: Not on file   Number of children: 2   Years of education: Not on file   Highest education level: Not on file  Occupational History    Comment: works at [employer]   Tobacco Use   Smoking status: Never   Smokeless tobacco: Never  Substance and Sexual Activity   Alcohol use: Not Currently   Drug use: Never   Sexual activity: Not on file  Other Topics Concern   Not on file  Social History Narrative   [name] is a 50 year old patient who works full time at [employer] 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 She lives with and has support of her daughters (79 & 57). She is independent with care needs and denies issues with transportation to medical appointments    Right handed   Social Determinants of Health   Financial Resource Strain: Not on file  Food Insecurity: Not on  file  Transportation Needs: Not on file  Physical Activity: Not on file  Stress: Not on file  Social Connections: Not on file  Intimate Partner Violence: Not on file    No Known Allergies  Family History  Problem Relation Age of Onset   Diabetes Father    Stroke Sister    Stroke Other     Prior to Admission medications   Medication Sig Start Date End Date Taking? Authorizing Provider  amLODipine (NORVASC) 5 MG tablet Take 1 tablet (5 mg total) by mouth at bedtime. 02/01/21   Garvin Fila, MD  atorvastatin (LIPITOR) 80 MG tablet Take 1 tablet (80 mg total) by mouth daily. 03/16/21   [name], [name], MD   busPIRone (BUSPAR) 5 MG tablet 1 tab am and 2 tabs pm. 04/20/21   [name], [name], MD  clopidogrel (PLAVIX) 75 MG tablet Take 1 tablet (75 mg total) by mouth daily. 03/06/21   [name], [name], MD  lisinopril (ZESTRIL) 20 MG tablet Take 1 tablet (20 mg total) by mouth daily. 12/14/20   Garvin Fila, MD  Multiple Vitamins-Minerals (MULTIVITAMIN GUMMIES ADULT PO) Take 2 tablets by mouth daily. 2 gummies daily    [provider]    Physical Exam: Vitals:   07/24/21 1600 07/24/21 1615 07/24/21 1630 07/24/21 1745  BP: 117/78 125/74 114/62 (!) 137/95  Pulse: 77 72 72 88  Resp: 19 11 14 13   Temp:   98.2 F (36.8 C)   TempSrc:      SpO2: 100% 100% 100% 100%     General:  Appears calm and comfortable and is in NAD Eyes:  PERRL, EOMI, normal lids, iris ENT:  grossly normal hearing, lips & tongue, mmm; appropriate dentition Neck:  no LAD, masses or thyromegaly; no carotid bruits Cardiovascular:  RRR, no m/r/g. No LE edema.  Respiratory:   CTA bilaterally with no wheezes/rales/rhonchi.  Normal respiratory effort. Abdomen:  soft, NT, ND, NABS Back:   normal alignment, no CVAT GU: vaginal normal. No lacerations or bleeding in introitus. Bleeding from cervical os.  Skin:  no rash or induration seen on limited exam Musculoskeletal:  grossly normal tone BUE/BLE, good ROM, no bony abnormality Lower extremity:  No LE edema.  Limited foot exam with no ulcerations.  2+ distal pulses. Psychiatric:  grossly normal mood and affect, speech fluent and appropriate, AOx3 Neurologic:  CN 2-12 grossly intact, moves all extremities in coordinated fashion, sensation intact. Heel to shin intact bilaterally. Finger to nose wnl bilaterally. DTR 2+. Gait deferred    Radiological Exams on Admission: Independently reviewed - see discussion in A/P where applicable  CT Head Wo Contrast  Result Date: 07/24/2021 CLINICAL DATA:  50 year old female with new onset of neurologic deficit. Possible stroke. EXAM:  CT HEAD WITHOUT CONTRAST TECHNIQUE: Contiguous axial images were obtained from the base of the skull through the vertex without intravenous contrast. COMPARISON:  CT angiography of the head and neck 02/16/2021. FINDINGS: Brain: Several well-defined areas of low attenuation are again noted in the left basal ganglia and left corona radiata, compatible with old lacunar infarcts and chronic ischemic changes in the white matter. No evidence of acute infarction, hemorrhage, hydrocephalus, extra-axial collection or mass lesion/mass effect. Vascular: No hyperdense vessel or unexpected calcification. Skull: Normal. Negative for fracture or focal lesion. Sinuses/Orbits: No acute finding. Other: None. IMPRESSION: 1. No acute intracranial abnormalities. 2. Chronic ischemic changes in the left basal ganglia and left periventricular white matter, similar to the prior examination, as  above. Electronically Signed   By: Vinnie Langton M.D.   On: 07/24/2021 12:14    EKG: Independently reviewed.  NSR with rate 80; nonspecific ST changes with no evidence of acute ischemia. Prolonged qt, new from prior ekg.    Labs on Admission: I have personally reviewed the available labs and imaging studies at the time of the admission.  Pertinent labs:  Hemoglobin 7.3,    Assessment/Plan Active Problems:   Symptomatic anemia secondary to DUB -50 year old peri menopausal female with 12 day history of vaginal bleeding with hgb of 7.3.  -admit to telemetry obs -transfusing 1 unit PRBC, repeat cbc post transfusion and in AM -called and discussed with GYN on call. Starting her on megace 20mg  BID for 30 days to help bridge with the depo shot to stop bleeding and she has appointment with GYN on 11/15.  -can not have estrogens with stroke history  -pelvic ultrasound done 9/22: ? 4.2cm diameter submucosal leiomyoma  -aptima testing yesterday at pcp -tsh wnl 9/22     Right leg weakness x 4 days -exam wnl. CTH wnl, MRI brain pending.  If + for acute CVA, consult neurology.  -on telemetry and will do neurochecks q 4 hours with hx of speech deficit that has resolved.  -PT ordered   Iron deficiency anemia -hgb 9.8 in 9/22, iron studies today: iron deficiency    Hx of  CVA (cerebral vascular accident) ([place]) -continue lipitor, hold plavix with symptomatic anemia     Essential hypertension, benign Well controlled, continue norvasc/lisinopril     Anxiety  Well controlled, continue buspar BID     There is no height or weight on file to calculate BMI.    Level of care: Telemetry Medical DVT prophylaxis:  SCDs Code Status:  Full - confirmed with patient Family Communication: Ebony Hunt: daughter at bedside  Disposition Plan:  The patient is from: home  Anticipated d/c is to: home    Anticipated d/c date will depend on clinical response to treatment, but possibly as early as tomorrow if she has excellent response to treatment.    Patient is currently: stable  Consults called: gyn  Admission status:  observation   Dragon dictation used in completing this note.    Orma Flaming MD [facility]   How to contact the [facility] Attending or Consulting provider [place] or covering provider during after hours [place], for this patient?  Check the care team in [facility] and look for a) attending/consulting [facility] provider listed and b) the [facility] team listed Log into [email or web address] and use Olivia Lopez de Gutierrez's universal password to access. If you do not have the password, please contact the hospital operator. Locate the [facility] provider you are looking for under [facility] and page to a number that you can be directly reached. If you still have difficulty reaching the provider, please page the [facility] (Director on Call) for the Hospitalists listed on amion for assistance.   07/24/2021, 6:14 PM

## 2021-07-24 NOTE — Telephone Encounter (Signed)
FYI Pt is calling to let dr [name] know she is in route to [place] for the bleeding and maybe getting blood transfusion

## 2021-07-24 NOTE — ED Triage Notes (Signed)
Pt. Stated, Jacqueline Medina come here because of vaginal bleeding for 11 days and I started having some weakness in my legs not able to lift them and I cant remember what to say.. this started yesterday.my  Dr told me to come here for a blood transfusion.

## 2021-07-24 NOTE — ED Provider Notes (Signed)
[facility] EMERGENCY DEPARTMENT Provider Note   CSN: [id number] Arrival date & time: 07/24/21  4332     History Chief Complaint  Patient presents with   Vaginal Bleeding   Stroke Symptoms    Jacqueline Medina is a 50 y.o. female.  50 year old female presents with concern for anemia, states that she has had heavy vaginal bleeding for the past 11 days, went to her PCP yesterday who checked a CBC, results unknown, was told that she would need to come to the emergency room for a transfusion.  Patient states that she has been passing large clots.  Patient was given unknown injection yesterday, states bleeding has decreased today.  She denies chest pain, shortness of breath.  No prior transfusions but is agreeable to receiving a transfusion today if necessary.  Also states that she has noticed that she has been dragging her right leg and that her speech is slurred, friend at bedside confirms this.  Reports prior CVA with right leg residual weakness however states this is different.  States last normal 3 days ago.      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     Family  History  Problem Relation Age of Onset   Diabetes Father    Stroke Sister    Stroke Other     Social History   Tobacco Use   Smoking status: Never   Smokeless tobacco: Never  Substance Use Topics   Alcohol use: Not Currently   Drug use: Never    Home Medications Prior to Admission medications   Medication Sig Start Date End Date Taking? Authorizing Provider  amLODipine (NORVASC) 5 MG tablet Take 1 tablet (5 mg total) by mouth at bedtime. 02/01/21  Yes Garvin Fila, MD  atorvastatin (LIPITOR) 80 MG tablet Take 1 tablet (80 mg total) by mouth daily. 03/16/21  Yes [name], [name], MD  busPIRone (BUSPAR) 5 MG tablet 1 tab am and 2 tabs pm. 04/20/21  Yes [name], [name], MD  clopidogrel (PLAVIX) 75 MG tablet Take 1 tablet (75 mg total) by mouth daily. 03/06/21  Yes [name], [name], MD  lisinopril (ZESTRIL) 20 MG tablet Take 1 tablet (20 mg total) by mouth daily. 12/14/20  Yes Garvin Fila, MD  Multiple Vitamins-Minerals (MULTIVITAMIN ADULTS) TABS Take 1 tablet by mouth daily.   Yes [provider]    Allergies    Patient has no known allergies.  Review of Systems   Review of Systems  Constitutional:  Negative for chills and fever.  Respiratory:  Negative for shortness of breath.   Cardiovascular:  Negative for chest  pain.  Gastrointestinal:  Negative for abdominal pain, constipation, diarrhea, nausea and vomiting.  Genitourinary:  Positive for vaginal bleeding.  Musculoskeletal:  Negative for arthralgias and myalgias.  Skin:  Negative for rash and wound.  Allergic/Immunologic: Negative for immunocompromised state.  Neurological:  Positive for speech difficulty and weakness.  Hematological:  Negative for adenopathy.  Psychiatric/Behavioral:  Negative for confusion.   All other systems reviewed and are negative.  Physical Exam Updated Vital Signs BP 114/62 (BP Location: Left Arm)   Pulse 72   Temp 98.2 F (36.8 C)   Resp 14   LMP 07/12/2021   SpO2 100%    Physical Exam Vitals and nursing note reviewed.  Constitutional:      General: She is not in acute distress.    Appearance: She is well-developed. She is not diaphoretic.  HENT:     Head: Normocephalic and atraumatic.  Eyes:     Conjunctiva/sclera: Conjunctivae normal.  Cardiovascular:     Rate and Rhythm: Normal rate and regular rhythm.     Pulses: Normal pulses.     Heart sounds: Normal heart sounds.  Pulmonary:     Effort: Pulmonary effort is normal.     Breath sounds: Normal breath sounds.  Abdominal:     Palpations: Abdomen is soft.     Tenderness: There is no abdominal tenderness.  Musculoskeletal:     Cervical back: Neck supple.     Right lower leg: No edema.     Left lower leg: No edema.  Skin:    General: Skin is warm and dry.     Findings: No erythema or rash.  Neurological:     Mental Status: She is alert and oriented to person, place, and time.     Cranial Nerves: No cranial nerve deficit.     Sensory: No sensory deficit.     Motor: No weakness.     Coordination: Coordination normal.  Psychiatric:        Behavior: Behavior normal.    ED Results / Procedures / Treatments   Labs (all labs ordered are listed, but only abnormal results are displayed) Labs Reviewed  COMPREHENSIVE METABOLIC PANEL - Abnormal; Notable for the following components:      Result Value   Glucose, Bld 109 (*)    Calcium 8.3 (*)    Total Protein 5.9 (*)    Albumin 3.2 (*)    Anion gap 4 (*)    All other components within normal limits  CBC WITH DIFFERENTIAL/PLATELET - Abnormal; Notable for the following components:   RBC 2.35 (*)    Hemoglobin 7.3 (*)    HCT 22.0 (*)    All other components within normal limits  RESP PANEL BY RT-PCR (FLU A&B, COVID) ARPGX2  PROTIME-INR  VITAMIN B12  FOLATE  IRON AND TIBC  FERRITIN  RETICULOCYTES  TYPE AND SCREEN  PREPARE RBC (CROSSMATCH)  ABO/RH    EKG None  Radiology CT Head Wo Contrast  Result Date: 07/24/2021 CLINICAL DATA:   50 year old female with new onset of neurologic deficit. Possible stroke. EXAM: CT HEAD WITHOUT CONTRAST TECHNIQUE: Contiguous axial images were obtained from the base of the skull through the vertex without intravenous contrast. COMPARISON:  CT angiography of the head and neck 02/16/2021. FINDINGS: Brain: Several well-defined areas of low attenuation are again noted in the left basal ganglia and left corona radiata, compatible with old lacunar infarcts and chronic ischemic changes in the white matter. No evidence of acute infarction, hemorrhage, hydrocephalus, extra-axial collection  or mass lesion/mass effect. Vascular: No hyperdense vessel or unexpected calcification. Skull: Normal. Negative for fracture or focal lesion. Sinuses/Orbits: No acute finding. Other: None. IMPRESSION: 1. No acute intracranial abnormalities. 2. Chronic ischemic changes in the left basal ganglia and left periventricular white matter, similar to the prior examination, as above. Electronically Signed   By: Vinnie Langton M.D.   On: 07/24/2021 12:14    Procedures Procedures   Medications Ordered in ED Medications  0.9 %  sodium chloride infusion (has no administration in time range)    ED Course  I have reviewed the triage vital signs and the nursing notes.  Pertinent labs & imaging results that were available during my care of the patient were reviewed by me and considered in my medical decision making (see chart for details).  Clinical Course as of 07/24/21 1648  Tue Jul 24, 7138  762 50 year old female with concern for anemia as well as slurred speech and right leg weakness as above.  On exam, her speech seems to be normal to me, there is no unilateral weakness or sensory deficit, normal heel-to-shin bilaterally, normal arm and leg strength.  CT head is negative for acute findings, will further investigate with MRI brain.  Patient is found to be anemic today with a hemoglobin of 7.3, hematocrit 22.  Hemoglobin 8.0  yesterday from PCP office, 9.57-month ago and 12- 1 year ago. Plan is for transfusion and admission, 1 unit packed red blood cells ordered, anemia panel added.  CMP reviewed, no significant electrolyte abnormality. Hospitalist paged for consult.  Case discussed with Dr. Roslynn Amble, ER attending, agrees with plan of care. [LM]  1620 Case discussed with Dr. Eliberto Ivory with [facility] hospitalist service who will consult for admission. [LM]    Clinical Course User Index [LM] Roque Lias   MDM Rules/Calculators/A&P                            Final Clinical Impression(s) / ED Diagnoses Final diagnoses:  Anemia, unspecified type  Weakness of right lower extremity  Episode of change in speech    Rx / DC Orders ED Discharge Orders     None        Tacy Learn, PA-C 07/24/21 1649    Lucrezia Starch, MD 07/27/21 [phone]

## 2021-07-24 NOTE — ED Notes (Signed)
Patient scheduled for MRI within 1 hour, will wait to start blood transfusion until after MRI.

## 2021-07-24 NOTE — ED Notes (Signed)
ED Provider at bedside for pelvic exam.

## 2021-07-25 ENCOUNTER — Encounter (HOSPITAL_COMMUNITY): Payer: Self-pay | Admitting: Family Medicine

## 2021-07-25 DIAGNOSIS — I63232 Cerebral infarction due to unspecified occlusion or stenosis of left carotid arteries: Secondary | ICD-10-CM | POA: Diagnosis not present

## 2021-07-25 DIAGNOSIS — D649 Anemia, unspecified: Secondary | ICD-10-CM | POA: Diagnosis not present

## 2021-07-25 DIAGNOSIS — R29898 Other symptoms and signs involving the musculoskeletal system: Secondary | ICD-10-CM | POA: Diagnosis not present

## 2021-07-25 DIAGNOSIS — I10 Essential (primary) hypertension: Secondary | ICD-10-CM | POA: Diagnosis not present

## 2021-07-25 DIAGNOSIS — N939 Abnormal uterine and vaginal bleeding, unspecified: Secondary | ICD-10-CM

## 2021-07-25 LAB — CBC
HCT: 25.7 % — ABNORMAL LOW (ref 36.0–46.0)
HCT: 27.1 % — ABNORMAL LOW (ref 36.0–46.0)
Hemoglobin: 8.4 g/dL — ABNORMAL LOW (ref 12.0–15.0)
Hemoglobin: 9.1 g/dL — ABNORMAL LOW (ref 12.0–15.0)
MCH: 30 pg (ref 26.0–34.0)
MCH: 30.6 pg (ref 26.0–34.0)
MCHC: 32.7 g/dL (ref 30.0–36.0)
MCHC: 33.6 g/dL (ref 30.0–36.0)
MCV: 91.2 fL (ref 80.0–100.0)
MCV: 91.8 fL (ref 80.0–100.0)
Platelets: 211 10*3/uL (ref 150–400)
Platelets: 224 10*3/uL (ref 150–400)
RBC: 2.8 MIL/uL — ABNORMAL LOW (ref 3.87–5.11)
RBC: 2.97 MIL/uL — ABNORMAL LOW (ref 3.87–5.11)
RDW: 15.3 % (ref 11.5–15.5)
RDW: 15.3 % (ref 11.5–15.5)
WBC: 7.8 10*3/uL (ref 4.0–10.5)
WBC: 9 10*3/uL (ref 4.0–10.5)
nRBC: 0 % (ref 0.0–0.2)
nRBC: 0 % (ref 0.0–0.2)

## 2021-07-25 LAB — BASIC METABOLIC PANEL
Anion gap: 3 — ABNORMAL LOW (ref 5–15)
BUN: 13 mg/dL (ref 6–20)
CO2: 25 mmol/L (ref 22–32)
Calcium: 7.9 mg/dL — ABNORMAL LOW (ref 8.9–10.3)
Chloride: 109 mmol/L (ref 98–111)
Creatinine, Ser: 0.94 mg/dL (ref 0.44–1.00)
GFR, Estimated: >60 mL/min (ref >60)
Glucose, Bld: 93 mg/dL (ref 70–99)
Potassium: 3.8 mmol/L (ref 3.5–5.1)
Sodium: 137 mmol/L (ref 135–145)

## 2021-07-25 LAB — CERVICOVAGINAL ANCILLARY ONLY
Bacterial Vaginitis (gardnerella): NEGATIVE
Candida Glabrata: NEGATIVE
Candida Vaginitis: NEGATIVE
Chlamydia: NEGATIVE
Comment: NEGATIVE
Comment: NEGATIVE
Comment: NEGATIVE
Comment: NEGATIVE
Comment: NEGATIVE
Comment: NORMAL
Neisseria Gonorrhea: NEGATIVE
Trichomonas: NEGATIVE

## 2021-07-25 LAB — BPAM RBC
Blood Product Expiration Date: 202211282359
ISSUE DATE / TIME: 202211012008
Unit Type and Rh: 5100

## 2021-07-25 LAB — HIV ANTIBODY (ROUTINE TESTING W REFLEX): HIV Screen 4th Generation wRfx: NONREACTIVE

## 2021-07-25 LAB — TYPE AND SCREEN
ABO/RH(D): O POS
Antibody Screen: NEGATIVE
Unit division: 0

## 2021-07-25 MED ORDER — SODIUM CHLORIDE 0.9 % IV SOLN
250.0000 mg | Freq: Every day | INTRAVENOUS | Status: AC
  Administered 2021-07-25 – 2021-07-26 (×2): 250 mg via INTRAVENOUS
  Filled 2021-07-25 (×2): qty 20

## 2021-07-25 NOTE — Care Management Obs Status (Signed)
[place] NOTIFICATION   Patient Details  Name: Jacqueline Medina MRN: [id number] Date of Birth: January 09, 1971   Medicare Observation Status Notification Given:  Yes    Tom-Johnson, Renea Ee, RN 07/25/2021, 5:02 PM

## 2021-07-25 NOTE — Progress Notes (Signed)
PROGRESS NOTE    Chaka Boyson  WVP:[id number] DOB: 1971-04-18 DOA: 07/24/2021 PCP: [name], [name], MD    Brief Narrative:  Jacqueline Medina was admitted to the hospital with the working diagnosis of symptomatic anemia, acute blood loss anemia due to vaginal bleeding   50 year old female past medical history for hypertension, history of CVA, anxiety, and leiomyoma who presented with vaginal bleeding.  Reported 1 month of heavy vaginal bleeding, before this her last period was about 6 months ago.  Severe bleeding with about 15 pads per day.  He received Depo shot day before hospitalization.  On the day of hospitalization she had slurred speech and right lower extremity weakness. On her initial physical examination she was afebrile, blood pressure 152/85, heart rate 89, respiratory rate 16, oxygen saturation 100% on room air.  Her lungs are clear to auscultation bilaterally, heart S1-S2, present, rhythmic, abdomen soft nontender, no lower extremity edema.  Sodium 137, potassium 3.5, chloride 108, bicarb 25, glucose 109, BUN 13, creatinine 0.82, white count 5.9, hemoglobin 7.3, hematocrit 32.0, platelets 222. SARS COVID-19 negative.  Head CT no acute changes. Brain MRI no acute changes.  EKG 80 bpm, normal axis, normal intervals, sinus rhythm, no significant ST segment or T wave changes.  Patient received 1 unit PRBC transfusion.  Assessment & Plan:   Active Problems:   CVA (cerebral vascular accident) [facility])   Essential hypertension, benign   Anxiety   Symptomatic anemia   Vaginal bleeding   Right leg weakness   Acute blood loss anemia, symptomatic anemia. Vaginal bleeding due to leiomyomas.  Patient continue to have vaginal bleeding, improved from last night but not back to baseline.  No dyspnea or chest pain, no slurred speech or focal weakness.  Patient is sp 1 unit PRBC transfusion follow up hgb is 8,4 and hct at 25.7 Plan to continue with megestrol and follow up with GYN as  outpatient.   2. Iron deficiency anemia. Iron stores with serum iron 14, TIBC 452, transferrin saturation is 3 and ferritin 9, Consistent with severe iron deficiency anemia.   Plan for IV iron x 2 doses and follow up iron stores as outpatient.   3. Hx of CVA. No new CVA per brain MRI and clinical examination,. Continue with statin therapy. Hold on clopidogrel due to acute bleeding.   4. HTN. Continue blood pressure control with amlodipine and lisinopril.   5. Anxiety. Continue with buspirone.      Patient continue to be at high risk for worsening anemia   Status is: Observation  The patient remains OBS appropriate and will d/c before 2 midnights.      DVT prophylaxis: Scd   Code Status:    full  Family Communication:   No family at the bedside      Subjective: Patient with no nausea or vomiting, no focal weakness or slurred speech, vaginal bleeding still present but decrease in intensity  Objective: Vitals:   07/25/21 0907 07/25/21 1000 07/25/21 1100 07/25/21 1248  BP: 115/72 99/61 123/69 107/60  Pulse:  75 72 74  Resp:  18 18 14   Temp:   98.5 F (36.9 C) 98.2 F (36.8 C)  TempSrc:   Oral Oral  SpO2:  100% 100% 100%  Weight:    67.3 kg  Height:    5\' 6"  (1.676 m)    Intake/Output Summary (Last 24 hours) at 07/25/2021 1617 Last data filed at 07/24/2021 2231 Gross per 24 hour  Intake 1027 ml  Output --  Net 1027 ml   Filed Weights   07/25/21 1248  Weight: 67.3 kg    Examination:   General: Not in pain or dyspnea, deconditioned  Neurology: Awake and alert, non focal  E ENT: positive pallor, no icterus, oral mucosa moist Cardiovascular: No JVD. S1-S2 present, rhythmic, no gallops, rubs, or murmurs. No lower extremity edema. Pulmonary: positive breath sounds bilaterally, adequate air movement, no wheezing, rhonchi or rales. Gastrointestinal. Abdomen soft and non tender Skin. No rashes Musculoskeletal: no joint deformities     Data Reviewed: I  have personally reviewed following labs and imaging studies  CBC: Recent Labs  Lab 07/23/21 1518 07/24/21 1030 07/25/21 0015 07/25/21 0324  WBC 9.0 5.9 9.0 7.8  NEUTROABS  --  3.8  --   --   HGB 8.0 Repeated and verified X2.* 7.3* 9.1* 8.4*  HCT 23.9 Repeated and verified X2.* 22.0* 27.1* 25.7*  MCV 92.0 93.6 91.2 91.8  PLT 193.0 220 224 408   Basic Metabolic Panel: Recent Labs  Lab 07/24/21 1030 07/25/21 0324  NA 137 137  K 3.5 3.8  CL 108 109  CO2 25 25  GLUCOSE 109* 93  BUN 13 13  CREATININE 0.82 0.94  CALCIUM 8.3* 7.9*   GFR: Estimated Creatinine Clearance: 65.5 mL/min (by C-G formula based on SCr of 0.94 mg/dL). Liver Function Tests: Recent Labs  Lab 07/24/21 1030  AST 19  ALT 15  ALKPHOS 94  BILITOT 0.4  PROT 5.9*  ALBUMIN 3.2*   No results for input(s): LIPASE, AMYLASE in the last 168 hours. No results for input(s): AMMONIA in the last 168 hours. Coagulation Profile: No results for input(s): INR, PROTIME in the last 168 hours. Cardiac Enzymes: No results for input(s): CKTOTAL, CKMB, CKMBINDEX, TROPONINI in the last 168 hours. BNP (last 3 results) No results for input(s): PROBNP in the last 8760 hours. HbA1C: No results for input(s): HGBA1C in the last 72 hours. CBG: No results for input(s): GLUCAP in the last 168 hours. Lipid Profile: No results for input(s): CHOL, HDL, LDLCALC, TRIG, CHOLHDL, LDLDIRECT in the last 72 hours. Thyroid Function Tests: No results for input(s): TSH, T4TOTAL, FREET4, T3FREE, THYROIDAB in the last 72 hours. Anemia Panel: Recent Labs    07/24/21 1625  VITAMINB12 455  FOLATE 30.0  FERRITIN 9*  TIBC 452*  IRON 14*  RETICCTPCT 5.7*      Radiology Studies: I have reviewed all of the imaging during this hospital visit personally     Scheduled Meds:  amLODipine  5 mg Oral QHS   atorvastatin  80 mg Oral Daily   busPIRone  10 mg Oral QHS   busPIRone  5 mg Oral q morning   lisinopril  20 mg Oral Daily    megestrol  20 mg Oral BID   multivitamin with minerals  1 tablet Oral Daily   sodium chloride flush  3 mL Intravenous Q12H   Continuous Infusions:  sodium chloride Stopped (07/24/21 2021)   sodium chloride       LOS: 0 days        Bretton Tandy Gerome Apley, MD

## 2021-07-25 NOTE — ED Notes (Signed)
Breakfast Orders placed 

## 2021-07-25 NOTE — ED Notes (Signed)
ED TO INPATIENT HANDOFF REPORT  ED Nurse Name and Phone #: Baxter Flattery, RN [phone]  S Name/Age/Gender Jacqueline Medina 50 y.o. female Room/Bed: 046C/046C  Code Status   Code Status: Full Code  Home/SNF/Other Home Patient oriented to: self, place, time, and situation Is this baseline? Yes   Triage Complete: Triage complete  Chief Complaint Symptomatic anemia [D64.9]  Triage Note Pt. Stated, Donnald Garre come here because of vaginal bleeding for 11 days and I started having some weakness in my legs not able to lift them and I cant remember what to say.. this started yesterday.my  Dr told me to come here for a blood transfusion.   Allergies No Known Allergies  Level of Care/Admitting Diagnosis ED Disposition     ED Disposition  Admit   Condition  --   [place]: [place] [[id number]]  Level of Care: Telemetry Medical [104]  May place patient in observation at [facility] or [place] if equivalent level of care is available:: No  Covid Evaluation: Asymptomatic Screening Protocol (No Symptoms)  Diagnosis: Symptomatic anemia [7106269]  Admitting Physician: Orma Flaming [[id number]]  Attending Physician: Adora Fridge          B Medical/Surgery History Past Medical History:  Diagnosis Date   Hypertension    Stroke [facility])    Past Surgical History:  Procedure Laterality Date   CESAREAN SECTION  1992   IR ANGIO INTRA EXTRACRAN SEL COM CAROTID INNOMINATE BILAT MOD SED  02/08/2020   IR ANGIO VERTEBRAL SEL VERTEBRAL BILAT MOD SED  02/08/2020   IR US GUIDE VASC ACCESS RIGHT  02/08/2020     A IV Location/Drains/Wounds Patient Lines/Drains/Airways Status     Active Line/Drains/Airways     Name Placement date Placement time Site Days   Peripheral IV 07/24/21 20 G Right Antecubital 07/24/21  1633  Antecubital  1   Peripheral IV 07/24/21 20 G Distal;Left Forearm 07/24/21  2011  Forearm  1            Intake/Output Last 24  hours  Intake/Output Summary (Last 24 hours) at 07/25/2021 1105 Last data filed at 07/24/2021 2231 Gross per 24 hour  Intake 1027 ml  Output --  Net 1027 ml    Labs/Imaging Results for orders placed or performed during the hospital encounter of 07/24/21 (from the past 48 hour(s))  Comprehensive metabolic panel     Status: Abnormal   Collection Time: 07/24/21 10:30 AM  Result Value Ref Range   Sodium 137 135 - 145 mmol/L   Potassium 3.5 3.5 - 5.1 mmol/L   Chloride 108 98 - 111 mmol/L   CO2 25 22 - 32 mmol/L   Glucose, Bld 109 (H) 70 - 99 mg/dL    Comment: Glucose reference range applies only to samples taken after fasting for at least 8 hours.   BUN 13 6 - 20 mg/dL   Creatinine, Ser 0.82 0.44 - 1.00 mg/dL   Calcium 8.3 (L) 8.9 - 10.3 mg/dL   Total Protein 5.9 (L) 6.5 - 8.1 g/dL   Albumin 3.2 (L) 3.5 - 5.0 g/dL   AST 19 15 - 41 U/L   ALT 15 0 - 44 U/L   Alkaline Phosphatase 94 38 - 126 U/L   Total Bilirubin 0.4 0.3 - 1.2 mg/dL   GFR, Estimated >60 >60 mL/min    Comment: (NOTE) Calculated using the CKD-EPI Creatinine Equation (2021)    Anion gap 4 (L) 5 - 15  Comment: Performed at [facility], [place] [address], [place], [place] [address]  CBC with Differential     Status: Abnormal   Collection Time: 07/24/21 10:30 AM  Result Value Ref Range   WBC 5.9 4.0 - 10.5 K/uL   RBC 2.35 (L) 3.87 - 5.11 MIL/uL   Hemoglobin 7.3 (L) 12.0 - 15.0 g/dL   HCT 22.0 (L) 36.0 - 46.0 %   MCV 93.6 80.0 - 100.0 fL   MCH 31.1 26.0 - 34.0 pg   MCHC 33.2 30.0 - 36.0 g/dL   RDW 15.0 11.5 - 15.5 %   Platelets 220 150 - 400 K/uL    Comment: REPEATED TO VERIFY   nRBC 0.0 0.0 - 0.2 %   Neutrophils Relative % 64 %   Neutro Abs 3.8 1.7 - 7.7 K/uL   Lymphocytes Relative 24 %   Lymphs Abs 1.4 0.7 - 4.0 K/uL   Monocytes Relative 10 %   Monocytes Absolute 0.6 0.1 - 1.0 K/uL   Eosinophils Relative 1 %   Eosinophils Absolute 0.1 0.0 - 0.5 K/uL   Basophils Relative 1 %   Basophils Absolute 0.0  0.0 - 0.1 K/uL   Immature Granulocytes 0 %   Abs Immature Granulocytes 0.02 0.00 - 0.07 K/uL    Comment: Performed at [facility] [address], [place], [place] [address]  ABO/Rh     Status: None   Collection Time: 07/24/21 10:30 AM  Result Value Ref Range   ABO/RH(D)      O POS Performed at [facility] [address] [place], [place] [address]   Prepare RBC (crossmatch)     Status: None   Collection Time: 07/24/21  4:01 PM  Result Value Ref Range   Order Confirmation      ORDER PROCESSED BY BLOOD BANK Performed at [facility], [place] [address] [place], [place] [address]   Vitamin B12     Status: None   Collection Time: 07/24/21  4:25 PM  Result Value Ref Range   Vitamin B-12 455 180 - 914 pg/mL    Comment: (NOTE) This assay is not validated for testing neonatal or myeloproliferative syndrome specimens for Vitamin B12 levels. Performed at [facility], [place] [address], [place], [place] [address]   Folate     Status: None   Collection Time: 07/24/21  4:25 PM  Result Value Ref Range   Folate 30.0 >5.9 ng/mL    Comment: Performed at [facility] [address] [place], [place] [address]  Iron and TIBC     Status: Abnormal   Collection Time: 07/24/21  4:25 PM  Result Value Ref Range   Iron 14 (L) 28 - 170 ug/dL   TIBC 452 (H) 250 - 450 ug/dL   Saturation Ratios 3 (L) 10.4 - 31.8 %   UIBC 438 ug/dL    Comment: Performed at [facility], [place] [address] [place], [place] [address]  Ferritin     Status: Abnormal   Collection Time: 07/24/21  4:25 PM  Result Value Ref Range   Ferritin 9 (L) 11 - 307 ng/mL    Comment: Performed at [facility], [place] [address], [place], [place] [address]  Reticulocytes     Status: Abnormal   Collection Time: 07/24/21  4:25 PM  Result Value Ref Range   Retic Ct Pct 5.7 (H) 0.4 - 3.1 %   RBC. 2.29 (L) 3.87 -  5.11 MIL/uL   Retic Count, Absolute 130.5 19.0 - 186.0 K/uL   Immature Retic Fract 31.8  (H) 2.3 - 15.9 %    Comment: Performed at [facility] [address] [place], [place] [address]  Type and screen [place]     Status: None   Collection Time: 07/24/21  4:25 PM  Result Value Ref Range   ABO/RH(D) O POS    Antibody Screen NEG    Sample Expiration 07/27/2021,2359    Unit Number [id number]    Blood Component Type RED CELLS,LR    Unit division 00    Status of Unit ISSUED,FINAL    Transfusion Status OK TO TRANSFUSE    Crossmatch Result      Compatible Performed at [facility], [place] [address], [place], [place] [address]   Resp Panel by RT-PCR (Flu A&B, Covid) Nasopharyngeal Swab     Status: None   Collection Time: 07/24/21  4:31 PM   Specimen: Nasopharyngeal Swab; Nasopharyngeal(NP) swabs in vial transport medium  Result Value Ref Range   SARS Coronavirus 2 by RT PCR NEGATIVE NEGATIVE    Comment: (NOTE) SARS-CoV-2 target nucleic acids are NOT DETECTED.  The SARS-CoV-2 RNA is generally detectable in upper respiratory specimens during the acute phase of infection. The lowest concentration of SARS-CoV-2 viral copies this assay can detect is 138 copies/mL. A negative result does not preclude SARS-Cov-2 infection and should not be used as the sole basis for treatment or other patient management decisions. A negative result may occur with  improper specimen collection/handling, submission of specimen other than nasopharyngeal swab, presence of viral mutation(s) within the areas targeted by this assay, and inadequate number of viral copies(<138 copies/mL). A negative result must be combined with clinical observations, patient history, and epidemiological information. The expected result is Negative.  Fact Sheet for Patients:  EntrepreneurPulse.com.au  Fact Sheet for Healthcare Providers:  IncredibleEmployment.be  This test is no t yet approved or cleared by the [place] FDA and  has been  authorized for detection and/or diagnosis of SARS-CoV-2 by FDA under an Emergency Use Authorization (EUA). This EUA will remain  in effect (meaning this test can be used) for the duration of the COVID-19 declaration under Section 564(b)(1) of the Act, 21 U.S.C.section 360bbb-3(b)(1), unless the authorization is terminated  or revoked sooner.       Influenza A by PCR NEGATIVE NEGATIVE   Influenza B by PCR NEGATIVE NEGATIVE    Comment: (NOTE) The Xpert Xpress SARS-CoV-2/FLU/RSV plus assay is intended as an aid in the diagnosis of influenza from Nasopharyngeal swab specimens and should not be used as a sole basis for treatment. Nasal washings and aspirates are unacceptable for Xpert Xpress SARS-CoV-2/FLU/RSV testing.  Fact Sheet for Patients: EntrepreneurPulse.com.au  Fact Sheet for Healthcare Providers: IncredibleEmployment.be  This test is not yet approved or cleared by the [place] FDA and has been authorized for detection and/or diagnosis of SARS-CoV-2 by FDA under an Emergency Use Authorization (EUA). This EUA will remain in effect (meaning this test can be used) for the duration of the COVID-19 declaration under Section 564(b)(1) of the Act, 21 U.S.C. section 360bbb-3(b)(1), unless the authorization is terminated or revoked.  Performed at [facility], [place] [address] [place], [place] [address]   HIV Antibody (routine testing w rflx)     Status: None   Collection Time: 07/24/21  8:11 PM  Result Value Ref Range   HIV Screen 4th Generation wRfx  Non Reactive Non Reactive    Comment: Performed at [facility], [place] [address] [place], [place] [address]  CBC     Status: Abnormal   Collection Time: 07/25/21 12:15 AM  Result Value Ref Range   WBC 9.0 4.0 - 10.5 K/uL   RBC 2.97 (L) 3.87 - 5.11 MIL/uL   Hemoglobin 9.1 (L) 12.0 - 15.0 g/dL   HCT 27.1 (L) 36.0 - 46.0 %   MCV 91.2 80.0 - 100.0 fL   MCH 30.6 26.0 - 34.0 pg    MCHC 33.6 30.0 - 36.0 g/dL   RDW 15.3 11.5 - 15.5 %   Platelets 224 150 - 400 K/uL    Comment: REPEATED TO VERIFY   nRBC 0.0 0.0 - 0.2 %    Comment: Performed at [facility], [place] [address] [place], [place] [address]  CBC     Status: Abnormal   Collection Time: 07/25/21  3:24 AM  Result Value Ref Range   WBC 7.8 4.0 - 10.5 K/uL   RBC 2.80 (L) 3.87 - 5.11 MIL/uL   Hemoglobin 8.4 (L) 12.0 - 15.0 g/dL   HCT 25.7 (L) 36.0 - 46.0 %   MCV 91.8 80.0 - 100.0 fL   MCH 30.0 26.0 - 34.0 pg   MCHC 32.7 30.0 - 36.0 g/dL   RDW 15.3 11.5 - 15.5 %   Platelets 211 150 - 400 K/uL    Comment: REPEATED TO VERIFY   nRBC 0.0 0.0 - 0.2 %    Comment: Performed at [facility], [place] [address] [place], [place] [address]  Basic metabolic panel     Status: Abnormal   Collection Time: 07/25/21  3:24 AM  Result Value Ref Range   Sodium 137 135 - 145 mmol/L   Potassium 3.8 3.5 - 5.1 mmol/L   Chloride 109 98 - 111 mmol/L   CO2 25 22 - 32 mmol/L   Glucose, Bld 93 70 - 99 mg/dL    Comment: Glucose reference range applies only to samples taken after fasting for at least 8 hours.   BUN 13 6 - 20 mg/dL   Creatinine, Ser 0.94 0.44 - 1.00 mg/dL   Calcium 7.9 (L) 8.9 - 10.3 mg/dL   GFR, Estimated >60 >60 mL/min    Comment: (NOTE) Calculated using the CKD-EPI Creatinine Equation (2021)    Anion gap 3 (L) 5 - 15    Comment: Performed at [facility] [address], [place], [place] [address]   CT Head Wo Contrast  Result Date: 07/24/2021 CLINICAL DATA:  50 year old female with new onset of neurologic deficit. Possible stroke. EXAM: CT HEAD WITHOUT CONTRAST TECHNIQUE: Contiguous axial images were obtained from the base of the skull through the vertex without intravenous contrast. COMPARISON:  CT angiography of the head and neck 02/16/2021. FINDINGS: Brain: Several well-defined areas of low attenuation are again noted in the left basal ganglia and left corona radiata, compatible with old lacunar  infarcts and chronic ischemic changes in the white matter. No evidence of acute infarction, hemorrhage, hydrocephalus, extra-axial collection or mass lesion/mass effect. Vascular: No hyperdense vessel or unexpected calcification. Skull: Normal. Negative for fracture or focal lesion. Sinuses/Orbits: No acute finding. Other: None. IMPRESSION: 1. No acute intracranial abnormalities. 2. Chronic ischemic changes in the left basal ganglia and left periventricular white matter, similar to the prior examination, as above. Electronically Signed   By: Vinnie Langton M.D.   On: 07/24/2021 12:14   MR  BRAIN WO CONTRAST  Result Date: 07/24/2021 CLINICAL DATA:  Neuro deficit, stroke suspected EXAM: MRI HEAD WITHOUT CONTRAST TECHNIQUE: Multiplanar, multiecho pulse sequences of the brain and surrounding structures were obtained without intravenous contrast. COMPARISON:  02/05/2020 FINDINGS: Brain: No restricted diffusion to suggest acute infarct. No hemorrhage, mass, mass effect, or midline shift. No hydrocephalus or extra-axial collection. T2 hyperintense signal in the periventricular white matter, likely the sequela of moderate chronic small vessel ischemic disease. Vascular: Relatively diminutive left ICA and MCA, unchanged from the prior exam. Otherwise normal flow voids. Skull and upper cervical spine: Normal marrow signal. Sinuses/Orbits: Negative. Other: The mastoids are well aerated. IMPRESSION: No acute intracranial process. Electronically Signed   By: Merilyn Baba M.D.   On: 07/24/2021 19:03    Pending Labs Unresulted Labs (From admission, onward)     Start     Ordered   07/24/21 1100  Protime-INR  Once,   STAT        07/24/21 1100            Vitals/Pain Today's Vitals   07/25/21 0646 07/25/21 0700 07/25/21 0907 07/25/21 1000  BP:  102/70 115/72 99/61  Pulse:  79  75  Resp:  15  18  Temp:      TempSrc:      SpO2:  100%  100%  PainSc: Asleep       Isolation Precautions No active  isolations  Medications Medications  0.9 %  sodium chloride infusion (0 mL/hr Intravenous Hold 07/24/21 2021)  atorvastatin (LIPITOR) tablet 80 mg (80 mg Oral Given 07/25/21 0906)  amLODipine (NORVASC) tablet 5 mg (0 mg Oral Hold 07/24/21 2149)  multivitamin with minerals tablet 1 tablet (1 tablet Oral Given 07/25/21 0906)  busPIRone (BUSPAR) tablet 5 mg (5 mg Oral Given 07/25/21 0906)  busPIRone (BUSPAR) tablet 10 mg (10 mg Oral Given 07/25/21 0003)  megestrol (MEGACE) tablet 20 mg (20 mg Oral Given 07/25/21 0958)  lisinopril (ZESTRIL) tablet 20 mg (20 mg Oral Given 07/25/21 0907)  sodium chloride flush (NS) 0.9 % injection 3 mL (3 mLs Intravenous Given 07/25/21 0959)  sodium chloride flush (NS) 0.9 % injection 3 mL (has no administration in time range)  0.9 %  sodium chloride infusion (has no administration in time range)  acetaminophen (TYLENOL) tablet 650 mg (has no administration in time range)    Or  acetaminophen (TYLENOL) suppository 650 mg (has no administration in time range)    Mobility walks Low fall risk   Focused Assessments Neuro Assessment Handoff:  Swallow screen pass? Yes    NIH Stroke Scale ( + Modified Stroke Scale Criteria)  LOC Questions (1b. )   +: Answers both questions correctly LOC Commands (1c. )   + : Performs both tasks correctly Best Gaze (2. )  +: Normal Visual (3. )  +: No visual loss Motor Arm, Left (5a. )   +: No drift Motor Arm, Right (5b. )   +: No drift Motor Leg, Left (6a. )   +: No drift Motor Leg, Right (6b. )   +: No drift Sensory (8. )   +: Normal, no sensory loss Best Language (9. )   +: No aphasia Extinction/Inattention (11.)   +: No Abnormality Modified SS Total  +: 0     Neuro Assessment: Within Defined Limits Neuro Checks:      Last Documented NIHSS Modified Score: 0 (07/24/21 2025) Has TPA been given? No If patient is a Neuro Trauma and patient is going  to OR before floor call report to [place] nurse: [phone] or  [phone]   R Recommendations: See Admitting Provider Note  Report given to:   Additional Notes:

## 2021-07-25 NOTE — Plan of Care (Signed)
  Problem: Education: Goal: Knowledge of General Education information will improve Description: Including pain rating scale, medication(s)/side effects and non-pharmacologic comfort measures Outcome: Progressing   Problem: Clinical Measurements: Goal: Will remain free from infection Outcome: Progressing   Problem: Coping: Goal: Level of anxiety will decrease Outcome: Progressing   

## 2021-07-25 NOTE — Evaluation (Signed)
Physical Therapy Evaluation and Discharge Patient Details Name: Jacqueline Medina MRN: [id number] DOB: [birth year] Today's Date: 07/25/2021  History of Present Illness  Pt is a 50 y/o female admitted secondary to excessive vaginal bleeding and RLE weakness. MRI negative. Found to have symptomatic anemia. PMH includes CVA with R deficits, and HTN.  Clinical Impression  Patient evaluated by Physical Therapy with no further acute PT needs identified. All education has been completed and the patient has no further questions. Pt overall at a supervision to independent level with mobility. Notable RLE weakness, and mild instability, but no overt LOB. Feel pt would benefit from follow up at outpatient PT to address RLE deficits. No further acute PT needs at this time. See below for any follow-up Physical Therapy or equipment needs. PT is signing off. Thank you for this referral. If needs change, please re-consult.         Recommendations for follow up therapy are one component of a multi-disciplinary discharge planning process, led by the attending physician.  Recommendations may be updated based on patient status, additional functional criteria and insurance authorization.  Follow Up Recommendations Outpatient PT    Assistance Recommended at Discharge PRN  Functional Status Assessment Patient has had a recent decline in their functional status and demonstrates the ability to make significant improvements in function in a reasonable and predictable amount of time.  Equipment Recommendations  None recommended by PT    Recommendations for Other Services       Precautions / Restrictions Precautions Precautions: None Restrictions Weight Bearing Restrictions: No      Mobility  Bed Mobility Overal bed mobility: Independent                  Transfers Overall transfer level: Independent                      Ambulation/Gait Ambulation/Gait assistance: Supervision Gait  Distance (Feet): 150 Feet Assistive device: None Gait Pattern/deviations: Step-to pattern;Decreased weight shift to right;Decreased dorsiflexion - right Gait velocity: Decreased   General Gait Details: Pt with functional weakness noted in RLE and tended to drag RLE. Mild instability noted with DGI tasks.  Stairs            Wheelchair Mobility    Modified Rankin (Stroke Patients Only)       Balance Overall balance assessment: Needs assistance Sitting-balance support: No upper extremity supported Sitting balance-Leahy Scale: Normal     Standing balance support: No upper extremity supported;During functional activity Standing balance-Leahy Scale: Good                   Standardized Balance Assessment Standardized Balance Assessment : Dynamic Gait Index   Dynamic Gait Index Level Surface: Mild Impairment Gait with Horizontal Head Turns: Mild Impairment Gait with Vertical Head Turns: Mild Impairment Gait and Pivot Turn: Mild Impairment Step Over Obstacle: Mild Impairment Step Around Obstacles: Normal       Pertinent Vitals/Pain Pain Assessment: No/denies pain    Home Living Family/patient expects to be discharged to:: Private residence Living Arrangements: Children Available Help at Discharge: Family Type of Home: House Home Access: Level entry     Alternate Level Stairs-Number of Steps: flight Home Layout: Two level Home Equipment: None      Prior Function Prior Level of Function : Independent/Modified Independent;Working/employed                     Hand Dominance  Extremity/Trunk Assessment   Upper Extremity Assessment Upper Extremity Assessment: Overall WFL for tasks assessed    Lower Extremity Assessment Lower Extremity Assessment: RLE deficits/detail RLE Deficits / Details: Functional weakness noted as pt tended to drag RLE    Cervical / Trunk Assessment Cervical / Trunk Assessment: Normal  Communication    Communication: No difficulties  Cognition Arousal/Alertness: Awake/alert Behavior During Therapy: WFL for tasks assessed/performed Overall Cognitive Status: Within Functional Limits for tasks assessed                                          General Comments      Exercises     Assessment/Plan    PT Assessment All further PT needs can be met in the next venue of care  PT Problem List Decreased mobility;Decreased strength;Decreased coordination       PT Treatment Interventions      PT Goals (Current goals can be found in the Care Plan section)  Acute Rehab PT Goals Patient Stated Goal: to go home PT Goal Formulation: With patient Time For Goal Achievement: 07/25/21 Potential to Achieve Goals: Good    Frequency     Barriers to discharge        Co-evaluation               AM-PAC PT "6 Clicks" Mobility  Outcome Measure Help needed turning from your back to your side while in a flat bed without using bedrails?: None Help needed moving from lying on your back to sitting on the side of a flat bed without using bedrails?: None Help needed moving to and from a bed to a chair (including a wheelchair)?: None Help needed standing up from a chair using your arms (e.g., wheelchair or bedside chair)?: None Help needed to walk in hospital room?: A Little Help needed climbing 3-5 steps with a railing? : A Little 6 Click Score: 22    End of Session Equipment Utilized During Treatment: Gait belt Activity Tolerance: Patient tolerated treatment well Patient left: in bed;with call bell/phone within reach;with family/visitor present (on stretcher in ED) Nurse Communication: Mobility status PT Visit Diagnosis: Muscle weakness (generalized) (M62.81)    Time: 4163-8453 PT Time Calculation (min) (ACUTE ONLY): 11 min   Charges:   PT Evaluation $PT Eval Low Complexity: 1 Low          Lou Miner, DPT  Acute Rehabilitation Services  Pager: [phone] Office: [phone]   Rudean Hitt 07/25/2021, 1:47 PM

## 2021-07-26 DIAGNOSIS — D62 Acute posthemorrhagic anemia: Secondary | ICD-10-CM | POA: Diagnosis not present

## 2021-07-26 DIAGNOSIS — I63232 Cerebral infarction due to unspecified occlusion or stenosis of left carotid arteries: Secondary | ICD-10-CM | POA: Diagnosis not present

## 2021-07-26 DIAGNOSIS — D649 Anemia, unspecified: Secondary | ICD-10-CM | POA: Diagnosis not present

## 2021-07-26 DIAGNOSIS — D500 Iron deficiency anemia secondary to blood loss (chronic): Secondary | ICD-10-CM

## 2021-07-26 DIAGNOSIS — D219 Benign neoplasm of connective and other soft tissue, unspecified: Secondary | ICD-10-CM

## 2021-07-26 LAB — CBC
HCT: 25.2 % — ABNORMAL LOW (ref 36.0–46.0)
Hemoglobin: 8.4 g/dL — ABNORMAL LOW (ref 12.0–15.0)
MCH: 30.2 pg (ref 26.0–34.0)
MCHC: 33.3 g/dL (ref 30.0–36.0)
MCV: 90.6 fL (ref 80.0–100.0)
Platelets: 219 10*3/uL (ref 150–400)
RBC: 2.78 MIL/uL — ABNORMAL LOW (ref 3.87–5.11)
RDW: 15.2 % (ref 11.5–15.5)
WBC: 6.6 10*3/uL (ref 4.0–10.5)
nRBC: 0 % (ref 0.0–0.2)

## 2021-07-26 MED ORDER — MEGESTROL ACETATE 20 MG PO TABS: 20.0000 mg | ORAL_TABLET | Freq: Two times a day (BID) | ORAL | 0 refills | Status: DC

## 2021-07-26 NOTE — Plan of Care (Signed)

## 2021-07-26 NOTE — TOC Transition Note (Signed)
Transition of Care [facility]) - CM/SW Discharge Note   Patient Details  Name: Jacqueline Medina MRN: [id number] Date of Birth: May 24, 1971  Transition of [facility] CM/SW Contact:  Tom-Johnson, Renea Ee, RN Phone Number: 07/26/2021, 3:30 PM   Clinical Narrative:    CM spoke with patient at bedside about needs for post hospital transition. Patient is from home with daughter. Presented with vaginal bleed/anemia. States she has two children and two grand children. Employed by [employer].Independent with care and drives self prior to hospitalization. Does not have and does not use DME's. PCP is Betty [name] and uses [facility] in [place]. No recommendations noted and denies any TOC needs. Scheduled for discharge and  daughter will transport. No further TOC needs noted.   Final next level of care: Home/Self Care Barriers to Discharge: No Barriers Identified   Patient Goals and CMS Choice Patient states their goals for this hospitalization and ongoing recovery are:: To go home CMS Medicare.gov Compare Post Acute Care list provided to:: Patient Choice offered to / list presented to : NA  Discharge Placement                       Discharge Plan and Services                DME Arranged: N/A DME Agency: NA       HH Arranged: NA HH Agency: NA        Social Determinants of Health (SDOH) Interventions     Readmission Risk Interventions No flowsheet data found.

## 2021-07-26 NOTE — Discharge Summary (Signed)
Physician Discharge Summary  Jacqueline Medina PHX:[id number] DOB: 1971/07/24 DOA: 07/24/2021  PCP: [name], [name], MD  Admit date: 07/24/2021 Discharge date: 07/26/2021  Admitted From: Home Disposition:  Home   Recommendations for Outpatient Follow-up and new medication changes:  Follow up with Dr. [name] in 7 to 10 days.  Follow up with GYN as scheduled, on November 15 Follow iron stores as outpatient in 4 weeks.  Hold on clopidogrel until vaginal bleeding resolves.   Home Health: no   Equipment/Devices: no    Discharge Condition: stable  CODE STATUS: full  Diet recommendation:  heart helthy.   Brief/Interim Summary: Mrs. Geoffry Paradise was admitted to the hospital with the working diagnosis of symptomatic anemia, acute blood loss anemia due to vaginal bleeding    50 year old female past medical history for hypertension, history of CVA, anxiety, and uterine leiomyoma who presented with vaginal bleeding.  Reported 1 month of heavy vaginal bleeding, before this her last period was about 6 months ago.  Severe bleeding with about 15 pads per day.  She received Depo shot day before hospitalization.  On the day of hospitalization she had slurred speech and right lower extremity weakness. On her initial physical examination she was afebrile, blood pressure 152/85, heart rate 89, respiratory rate 16, oxygen saturation 100% on room air.  Her lungs were clear to auscultation bilaterally, heart S1-S2, present, rhythmic, abdomen soft nontender, no lower extremity edema.   Sodium 137, potassium 3.5, chloride 108, bicarb 25, glucose 109, BUN 13, creatinine 0.82, white count 5.9, hemoglobin 7.3, hematocrit 32.0, platelets 222. SARS COVID-19 negative.   Head CT no acute changes. Brain MRI no acute changes.   EKG 80 bpm, normal axis, normal intervals, sinus rhythm, no significant ST segment or T wave changes.   Patient received 1 unit PRBC transfusion with improvement in her hgb and hct. Found to have  iron deficiency and received 2 doses of IV iron with good toleration.   Acute blood loss anemia, symptomatic anemia, vaginal bleeding due to urinary leiomyomas. Patient was admitted to the medical ward, she tolerated well PRBC transfusion x1.  Her follow-up hemoglobin at discharge is 8.  With hematocrit of 25.2 and platelets 219.  Patient was placed on megace of per GYN recommendations.  She will follow-up in the outpatient GYN clinic for further recommendations. She will continue on Megace for 30 days to help bridge with Depo shot.  2.  Iron deficiency anemia.  Her iron stores show severe iron deficiency with a serum iron 14, TIBC 452, transferrin saturation of 3 and ferritin of 9. Patient received 2 doses of ferric gluconate intravenously with good toleration. Plan to follow-up iron stores as an outpatient.  3.  History of CVA.  Her focal neurologic symptoms resolved, further work-up with brain MRI ruled out new CVA. Patient continue taking statin therapy. Clopidogrel has been placed on hold due to active bleeding.  Plan to resume once GYN bleeding has resolved.  4.  Hypertension.  Continue blood pressure control with amlodipine and lisinopril.  5.  Anxiety.  Continue with buspirone.  Discharge Diagnoses:  Principal Problem:   Acute blood loss anemia Active Problems:   CVA (cerebral vascular accident) ([place])   Essential hypertension, benign   Anxiety   Anemia, iron deficiency   Symptomatic anemia   Vaginal bleeding   Leiomyoma    Discharge Instructions   Allergies as of 07/26/2021   No Known Allergies      Medication List     STOP  taking these medications    clopidogrel 75 MG tablet Commonly known as: PLAVIX       TAKE these medications    amLODipine 5 MG tablet Commonly known as: NORVASC Take 1 tablet (5 mg total) by mouth at bedtime.   atorvastatin 80 MG tablet Commonly known as: LIPITOR Take 1 tablet (80 mg total) by mouth daily.   busPIRone 5 MG  tablet Commonly known as: BUSPAR 1 tab am and 2 tabs pm.   lisinopril 20 MG tablet Commonly known as: ZESTRIL Take 1 tablet (20 mg total) by mouth daily.   megestrol 20 MG tablet Commonly known as: MEGACE Take 1 tablet (20 mg total) by mouth 2 (two) times daily.   Multivitamin Adults Tabs Take 1 tablet by mouth daily.        No Known Allergies  Consultations: GYN over the phone    Procedures/Studies: CT Head Wo Contrast  Result Date: 07/24/2021 CLINICAL DATA:  50 year old female with new onset of neurologic deficit. Possible stroke. EXAM: CT HEAD WITHOUT CONTRAST TECHNIQUE: Contiguous axial images were obtained from the base of the skull through the vertex without intravenous contrast. COMPARISON:  CT angiography of the head and neck 02/16/2021. FINDINGS: Brain: Several well-defined areas of low attenuation are again noted in the left basal ganglia and left corona radiata, compatible with old lacunar infarcts and chronic ischemic changes in the white matter. No evidence of acute infarction, hemorrhage, hydrocephalus, extra-axial collection or mass lesion/mass effect. Vascular: No hyperdense vessel or unexpected calcification. Skull: Normal. Negative for fracture or focal lesion. Sinuses/Orbits: No acute finding. Other: None. IMPRESSION: 1. No acute intracranial abnormalities. 2. Chronic ischemic changes in the left basal ganglia and left periventricular white matter, similar to the prior examination, as above. Electronically Signed   By: Vinnie Langton M.D.   On: 07/24/2021 12:14   MR BRAIN WO CONTRAST  Result Date: 07/24/2021 CLINICAL DATA:  Neuro deficit, stroke suspected EXAM: MRI HEAD WITHOUT CONTRAST TECHNIQUE: Multiplanar, multiecho pulse sequences of the brain and surrounding structures were obtained without intravenous contrast. COMPARISON:  02/05/2020 FINDINGS: Brain: No restricted diffusion to suggest acute infarct. No hemorrhage, mass, mass effect, or midline shift. No  hydrocephalus or extra-axial collection. T2 hyperintense signal in the periventricular white matter, likely the sequela of moderate chronic small vessel ischemic disease. Vascular: Relatively diminutive left ICA and MCA, unchanged from the prior exam. Otherwise normal flow voids. Skull and upper cervical spine: Normal marrow signal. Sinuses/Orbits: Negative. Other: The mastoids are well aerated. IMPRESSION: No acute intracranial process. Electronically Signed   By: Merilyn Baba M.D.   On: 07/24/2021 19:03      Subjective: Patient is feeling well, vaginal bleeding continue to improve, no nausea or vomiting, no chest pain or dyspnea, no focal neurologic symptoms.   Discharge Exam: Vitals:   07/25/21 2124 07/26/21 0502  BP: 131/76 110/69  Pulse: 74 67  Resp: 18 18  Temp: 98.7 F (37.1 C) 98 F (36.7 C)  SpO2: 100% 100%   Vitals:   07/25/21 1248 07/25/21 1721 07/25/21 2124 07/26/21 0502  BP: 107/60 118/66 131/76 110/69  Pulse: 74 76 74 67  Resp: 14 18 18 18   Temp: 98.2 F (36.8 C) 98.5 F (36.9 C) 98.7 F (37.1 C) 98 F (36.7 C)  TempSrc: Oral Oral  Oral  SpO2: 100% 100% 100% 100%  Weight: 67.3 kg     Height: 5\' 6"  (1.676 m)       General: Not in pain or dyspnea  Neurology: Awake and alert, non focal  E ENT: mild pallor, no icterus, oral mucosa moist Cardiovascular: No JVD. S1-S2 present, rhythmic, no gallops, rubs, or murmurs. No lower extremity edema. Pulmonary: positive breath sounds bilaterally, adequate air movement, no wheezing, rhonchi or rales. Gastrointestinal. Abdomen soft and non tender Skin. No rashes Musculoskeletal: no joint deformities   The results of significant diagnostics from this hospitalization (including imaging, microbiology, ancillary and laboratory) are listed below for reference.     Microbiology: Recent Results (from the past 240 hour(s))  Resp Panel by RT-PCR (Flu A&B, Covid) Nasopharyngeal Swab     Status: None   Collection Time: 07/24/21   4:31 PM   Specimen: Nasopharyngeal Swab; Nasopharyngeal(NP) swabs in vial transport medium  Result Value Ref Range Status   SARS Coronavirus 2 by RT PCR NEGATIVE NEGATIVE Final    Comment: (NOTE) SARS-CoV-2 target nucleic acids are NOT DETECTED.  The SARS-CoV-2 RNA is generally detectable in upper respiratory specimens during the acute phase of infection. The lowest concentration of SARS-CoV-2 viral copies this assay can detect is 138 copies/mL. A negative result does not preclude SARS-Cov-2 infection and should not be used as the sole basis for treatment or other patient management decisions. A negative result may occur with  improper specimen collection/handling, submission of specimen other than nasopharyngeal swab, presence of viral mutation(s) within the areas targeted by this assay, and inadequate number of viral copies(<138 copies/mL). A negative result must be combined with clinical observations, patient history, and epidemiological information. The expected result is Negative.  Fact Sheet for Patients:  EntrepreneurPulse.com.au  Fact Sheet for Healthcare Providers:  IncredibleEmployment.be  This test is no t yet approved or cleared by the [place] FDA and  has been authorized for detection and/or diagnosis of SARS-CoV-2 by FDA under an Emergency Use Authorization (EUA). This EUA will remain  in effect (meaning this test can be used) for the duration of the COVID-19 declaration under Section 564(b)(1) of the Act, 21 U.S.C.section 360bbb-3(b)(1), unless the authorization is terminated  or revoked sooner.       Influenza A by PCR NEGATIVE NEGATIVE Final   Influenza B by PCR NEGATIVE NEGATIVE Final    Comment: (NOTE) The Xpert Xpress SARS-CoV-2/FLU/RSV plus assay is intended as an aid in the diagnosis of influenza from Nasopharyngeal swab specimens and should not be used as a sole basis for treatment. Nasal washings and aspirates  are unacceptable for Xpert Xpress SARS-CoV-2/FLU/RSV testing.  Fact Sheet for Patients: EntrepreneurPulse.com.au  Fact Sheet for Healthcare Providers: IncredibleEmployment.be  This test is not yet approved or cleared by the [place] FDA and has been authorized for detection and/or diagnosis of SARS-CoV-2 by FDA under an Emergency Use Authorization (EUA). This EUA will remain in effect (meaning this test can be used) for the duration of the COVID-19 declaration under Section 564(b)(1) of the Act, 21 U.S.C. section 360bbb-3(b)(1), unless the authorization is terminated or revoked.  Performed at [facility], [place] [address] [place], [place] [address]      Labs: BNP (last 3 results) No results for input(s): BNP in the last 8760 hours. Basic Metabolic Panel: Recent Labs  Lab 07/24/21 1030 07/25/21 0324  NA 137 137  K 3.5 3.8  CL 108 109  CO2 25 25  GLUCOSE 109* 93  BUN 13 13  CREATININE 0.82 0.94  CALCIUM 8.3* 7.9*   Liver Function Tests: Recent Labs  Lab 07/24/21 1030  AST 19  ALT 15  ALKPHOS 94  BILITOT 0.4  PROT 5.9*  ALBUMIN 3.2*   No results for input(s): LIPASE, AMYLASE in the last 168 hours. No results for input(s): AMMONIA in the last 168 hours. CBC: Recent Labs  Lab 07/23/21 1518 07/24/21 1030 07/25/21 0015 07/25/21 0324 07/26/21 0421  WBC 9.0 5.9 9.0 7.8 6.6  NEUTROABS  --  3.8  --   --   --   HGB 8.0 Repeated and verified X2.* 7.3* 9.1* 8.4* 8.4*  HCT 23.9 Repeated and verified X2.* 22.0* 27.1* 25.7* 25.2*  MCV 92.0 93.6 91.2 91.8 90.6  PLT 193.0 220 224 211 219   Cardiac Enzymes: No results for input(s): CKTOTAL, CKMB, CKMBINDEX, TROPONINI in the last 168 hours. BNP: Invalid input(s): POCBNP CBG: No results for input(s): GLUCAP in the last 168 hours. D-Dimer No results for input(s): DDIMER in the last 72 hours. Hgb A1c No results for input(s): HGBA1C in the last 72 hours. Lipid  Profile No results for input(s): CHOL, HDL, LDLCALC, TRIG, CHOLHDL, LDLDIRECT in the last 72 hours. Thyroid function studies No results for input(s): TSH, T4TOTAL, T3FREE, THYROIDAB in the last 72 hours.  Invalid input(s): FREET3 Anemia work up Recent Labs    07/24/21 1625  VITAMINB12 455  FOLATE 30.0  FERRITIN 9*  TIBC 452*  IRON 14*  RETICCTPCT 5.7*   Urinalysis No results found for: COLORURINE, APPEARANCEUR, LABSPEC, [place], GLUCOSEU, HGBUR, [place], [place], PROTEINUR, UROBILINOGEN, NITRITE, LEUKOCYTESUR Sepsis Labs Invalid input(s): PROCALCITONIN,  WBC,  LACTICIDVEN Microbiology Recent Results (from the past 240 hour(s))  Resp Panel by RT-PCR (Flu A&B, Covid) Nasopharyngeal Swab     Status: None   Collection Time: 07/24/21  4:31 PM   Specimen: Nasopharyngeal Swab; Nasopharyngeal(NP) swabs in vial transport medium  Result Value Ref Range Status   SARS Coronavirus 2 by RT PCR NEGATIVE NEGATIVE Final    Comment: (NOTE) SARS-CoV-2 target nucleic acids are NOT DETECTED.  The SARS-CoV-2 RNA is generally detectable in upper respiratory specimens during the acute phase of infection. The lowest concentration of SARS-CoV-2 viral copies this assay can detect is 138 copies/mL. A negative result does not preclude SARS-Cov-2 infection and should not be used as the sole basis for treatment or other patient management decisions. A negative result may occur with  improper specimen collection/handling, submission of specimen other than nasopharyngeal swab, presence of viral mutation(s) within the areas targeted by this assay, and inadequate number of viral copies(<138 copies/mL). A negative result must be combined with clinical observations, patient history, and epidemiological information. The expected result is Negative.  Fact Sheet for Patients:  EntrepreneurPulse.com.au  Fact Sheet for Healthcare Providers:   IncredibleEmployment.be  This test is no t yet approved or cleared by the [place] FDA and  has been authorized for detection and/or diagnosis of SARS-CoV-2 by FDA under an Emergency Use Authorization (EUA). This EUA will remain  in effect (meaning this test can be used) for the duration of the COVID-19 declaration under Section 564(b)(1) of the Act, 21 U.S.C.section 360bbb-3(b)(1), unless the authorization is terminated  or revoked sooner.       Influenza A by PCR NEGATIVE NEGATIVE Final   Influenza B by PCR NEGATIVE NEGATIVE Final    Comment: (NOTE) The Xpert Xpress SARS-CoV-2/FLU/RSV plus assay is intended as an aid in the diagnosis of influenza from Nasopharyngeal swab specimens and should not be used as a sole basis for treatment. Nasal washings and aspirates are unacceptable for Xpert Xpress SARS-CoV-2/FLU/RSV testing.  Fact Sheet for Patients: EntrepreneurPulse.com.au  Fact Sheet  for Healthcare Providers: IncredibleEmployment.be  This test is not yet approved or cleared by the [place] and has been authorized for detection and/or diagnosis of SARS-CoV-2 by FDA under an Emergency Use Authorization (EUA). This EUA will remain in effect (meaning this test can be used) for the duration of the COVID-19 declaration under Section 564(b)(1) of the Act, 21 U.S.C. section 360bbb-3(b)(1), unless the authorization is terminated or revoked.  Performed at [facility], [place] [address], [place], [place] [address]      Time coordinating discharge: 45 minutes  SIGNED:   Tawni Millers, MD  [facility] 07/26/2021, 8:25 AM

## 2021-07-26 NOTE — Plan of Care (Signed)
All goals met. Patient discharged with instructions in care of family.  Received iron infusion dose via peripheral IV prior to discharge.  Tolerated well.

## 2021-08-03 ENCOUNTER — Encounter: Payer: Self-pay | Admitting: Family Medicine

## 2021-08-03 ENCOUNTER — Telehealth: Payer: BC Managed Care – PPO | Admitting: Family Medicine

## 2021-08-03 VITALS — BP 128/80 | HR 82 | Ht 66.0 in

## 2021-08-03 DIAGNOSIS — I10 Essential (primary) hypertension: Secondary | ICD-10-CM

## 2021-08-03 DIAGNOSIS — D509 Iron deficiency anemia, unspecified: Secondary | ICD-10-CM | POA: Diagnosis not present

## 2021-08-03 DIAGNOSIS — I63232 Cerebral infarction due to unspecified occlusion or stenosis of left carotid arteries: Secondary | ICD-10-CM | POA: Diagnosis not present

## 2021-08-03 DIAGNOSIS — N938 Other specified abnormal uterine and vaginal bleeding: Secondary | ICD-10-CM

## 2021-08-03 NOTE — Progress Notes (Signed)
Virtual Visit via Video Note I connected with [name] on 08/03/21 by a video enabled telemedicine application and verified that I am speaking with the correct person using two identifiers.  Location patient: home Location provider:work or home office Persons participating in the virtual visit: patient, provider  I discussed the limitations of evaluation and management by telemedicine and the availability of in person appointments. The patient expressed understanding and agreed to proceed.  Chief Complaint  Patient presents with   Hospitalization Follow-up   HPI: Jacqueline Medina is a 50 yo female with hx of CVA,HTN, DUB, moyamoya disease, and iron deficiency anemia following on recent hospitalization. She presented to the ER on 07/24/2021 because of right-sided weakness and slurred speech. Discharged on 07/26/2021. During hospitalization Plavix 75 mg was discontinue and Megace was started.  SARS COVID-19 negative.  Head CT: 1. No acute intracranial abnormalities. 2. Chronic ischemic changes in the left basal ganglia and left periventricular white matter, similar to the prior examination.  Brain MRI: Negative for acute intracranial process.  Iron deficiency anemia: Currently she is not on iron supplementation. At the time of admission hemoglobin 7.3, hematocrit 32.0, platelets 222. She received 1 unit of PRBC transfusion.  Serum iron 14, TIBC 452, transferrin saturation of 3 and ferritin of 9. She received 2 doses of ferric gluconate IV.  Vaginal bleeding resolved after hospitalization, started with mild vaginal spotting yesterday. Negative for pelvic pain, nausea,vomiting, or urinary symptoms. She has appointment with her gynecologist on 08/07/2021.  Lab Results  Component Value Date   WBC 6.6 07/26/2021   HGB 8.4 (L) 07/26/2021   HCT 25.2 (L) 07/26/2021   MCV 90.6 07/26/2021   PLT 219 07/26/2021  Ca++ 7.9 on 07/25/21 and 8.3 on 07/24/21. Corrected Ca++ on 07/24/21 was  8.9.  Hypertension: Currently she is on lisinopril 20 mg daily. Monitoring BPs at home: < 140/90. Negative for severe/frequent headache, visual changes, chest pain, dyspnea, palpitation, focal weakness, or edema. CVA in 01/2020. She is on Atorvastatin 80 mg daily.  Follows with Dr. Leonie Man, last visit on 12/14/2020.   Lab Results  Component Value Date   CREATININE 0.94 07/25/2021   BUN 13 07/25/2021   NA 137 07/25/2021   K 3.8 07/25/2021   CL 109 07/25/2021   CO2 25 07/25/2021   ROS: See pertinent positives and negatives per HPI.  Past Medical History:  Diagnosis Date   Hypertension    Stroke [facility])    Past Surgical History:  Procedure Laterality Date   CESAREAN SECTION  1992   IR ANGIO INTRA EXTRACRAN SEL COM CAROTID INNOMINATE BILAT MOD SED  02/08/2020   IR ANGIO VERTEBRAL SEL VERTEBRAL BILAT MOD SED  02/08/2020   IR US GUIDE VASC ACCESS RIGHT  02/08/2020   Family History  Problem Relation Age of Onset   Diabetes Father    Stroke Sister    Stroke Other     Social History   Socioeconomic History   Marital status: Single    Spouse name: Not on file   Number of children: 2   Years of education: Not on file   Highest education level: Not on file  Occupational History    Comment: works at [employer]   Tobacco Use   Smoking status: Never   Smokeless tobacco: Never  Vaping Use   Vaping Use: Never used  Substance and Sexual Activity   Alcohol use: Not Currently   Drug use: Never   Sexual activity: Not on file  Other  Topics Concern   Not on file  Social History Narrative   Jacqueline Medina is a 50 year old patient who works full time at [employer] 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 She lives with and has support of her daughters (56 & 37). She is independent with care needs and denies issues with transportation to medical appointments    Right handed   Social Determinants of Health   Financial Resource Strain: Not on file  Food Insecurity: Not on  file  Transportation Needs: Not on file  Physical Activity: Not on file  Stress: Not on file  Social Connections: Not on file  Intimate Partner Violence: Not on file    Current Outpatient Medications:    amLODipine (NORVASC) 5 MG tablet, Take 1 tablet (5 mg total) by mouth at bedtime., Disp: 90 tablet, Rfl: 3   atorvastatin (LIPITOR) 80 MG tablet, Take 1 tablet (80 mg total) by mouth daily., Disp: 90 tablet, Rfl: 2   busPIRone (BUSPAR) 5 MG tablet, 1 tab am and 2 tabs pm., Disp: 180 tablet, Rfl: 1   clopidogrel (PLAVIX) 75 MG tablet, Take 75 mg by mouth daily., Disp: , Rfl:    lisinopril (ZESTRIL) 20 MG tablet, Take 1 tablet (20 mg total) by mouth daily., Disp: 90 tablet, Rfl: 2   megestrol (MEGACE) 20 MG tablet, Take 1 tablet (20 mg total) by mouth 2 (two) times daily., Disp: 60 tablet, Rfl: 0   Multiple Vitamins-Minerals (MULTIVITAMIN ADULTS) TABS, Take 1 tablet by mouth daily., Disp: , Rfl:   EXAM:  VITALS per patient if applicable:BP 974/16   Pulse 82   Ht 5\' 6"  (1.676 m)   LMP 08/02/2021 Comment: spotting  BMI 23.95 kg/m   GENERAL: alert, oriented, appears well and in no acute distress  HEENT: atraumatic, conjunctiva clear, no obvious abnormalities on inspection.  NECK: normal movements of the head and neck  LUNGS: on inspection no signs of respiratory distress, breathing rate appears normal, no obvious gross SOB, gasping or wheezing  CV: no obvious cyanosis  MS: moves all visible extremities without noticeable abnormality  PSYCH/NEURO: pleasant and cooperative, no obvious depression or anxiety, speech and thought processing grossly intact  ASSESSMENT AND PLAN:  Discussed the following assessment and plan: Orders Placed This Encounter  Procedures   Iron   Ferritin   CBC   DUB (dysfunctional uterine bleeding) - Plan: CBC Fibroids. Greatly improved. Continue megestrol 20 mg twice daily, we discussed some side effects.  She will keep appt with gyn.  Iron  deficiency anemia, unspecified iron deficiency anemia type - Plan: Iron, Ferritin, CBC Caused by DUB. S/p iron infusion x2. CBC and iron studies orders placed to be done at [facility] on 08/07/21.  Essential hypertension BP adequately controlled. Continue lisinopril 20 mg daily and low-salt diet. Continue monitoring BP regularly.  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  We discussed some side effects, she was instructed to discontinue it if vaginal bleeding gets worse again. Continue atorvastatin 80 mg daily.  I discussed the assessment and treatment plan with the patient. The patient was provided an opportunity to ask questions and all were answered. The patient agreed with the plan and demonstrated an understanding of the instructions.  Return if symptoms worsen or fail to improve, for Keep next appt.  [name]. [name], MD  [facility]. [place] office.

## 2021-08-06 ENCOUNTER — Ambulatory Visit: Payer: BC Managed Care – PPO | Admitting: Obstetrics and Gynecology

## 2021-08-07 ENCOUNTER — Ambulatory Visit: Payer: BC Managed Care – PPO | Admitting: Obstetrics and Gynecology

## 2021-08-07 ENCOUNTER — Other Ambulatory Visit (INDEPENDENT_AMBULATORY_CARE_PROVIDER_SITE_OTHER): Payer: BC Managed Care – PPO

## 2021-08-07 ENCOUNTER — Encounter: Payer: Self-pay | Admitting: Obstetrics and Gynecology

## 2021-08-07 ENCOUNTER — Other Ambulatory Visit (HOSPITAL_COMMUNITY)
Admission: RE | Admit: 2021-08-07 | Discharge: 2021-08-07 | Disposition: A | Discharge status: Home / Self Care | Payer: BC Managed Care – PPO | Source: Ambulatory Visit | Attending: Obstetrics and Gynecology | Admitting: Obstetrics and Gynecology

## 2021-08-07 ENCOUNTER — Other Ambulatory Visit: Payer: Self-pay

## 2021-08-07 VITALS — BP 136/82 | HR 74 | Wt 150.0 lb

## 2021-08-07 DIAGNOSIS — D250 Submucous leiomyoma of uterus: Secondary | ICD-10-CM

## 2021-08-07 DIAGNOSIS — D509 Iron deficiency anemia, unspecified: Secondary | ICD-10-CM | POA: Diagnosis not present

## 2021-08-07 DIAGNOSIS — Z01419 Encounter for gynecological examination (general) (routine) without abnormal findings: Secondary | ICD-10-CM

## 2021-08-07 DIAGNOSIS — N939 Abnormal uterine and vaginal bleeding, unspecified: Secondary | ICD-10-CM

## 2021-08-07 DIAGNOSIS — N938 Other specified abnormal uterine and vaginal bleeding: Secondary | ICD-10-CM

## 2021-08-07 LAB — CBC
HCT: 33.9 % — ABNORMAL LOW (ref 36.0–46.0)
Hemoglobin: 10.9 g/dL — ABNORMAL LOW (ref 12.0–15.0)
MCHC: 32.3 g/dL (ref 30.0–36.0)
MCV: 93.7 fl (ref 78.0–100.0)
Platelets: 246 10*3/uL (ref 150.0–400.0)
RBC: 3.62 Mil/uL — ABNORMAL LOW (ref 3.87–5.11)
RDW: 16.3 % — ABNORMAL HIGH (ref 11.5–15.5)
WBC: 5.1 10*3/uL (ref 4.0–10.5)

## 2021-08-07 LAB — FERRITIN: Ferritin: 72.2 ng/mL (ref 10.0–291.0)

## 2021-08-07 LAB — IRON: Iron: 116 ug/dL (ref 42–145)

## 2021-08-07 MED ORDER — MEGESTROL ACETATE 20 MG PO TABS: 20.0000 mg | ORAL_TABLET | Freq: Two times a day (BID) | ORAL | 11 refills | Status: DC

## 2021-08-07 NOTE — Progress Notes (Signed)
Subjective:     Jacqueline Medina is a 50 y.o. female P2 with BMI 24 who is here for a comprehensive physical exam. The patient reports abnormal uterine bleeding over the past 2 months. Patient reports a monthly period lasting 5 days. However, in August and September she bled for 12 days, heavy in flow. This heavy period resulted in the need of a blood transfusion due to anemia. She has since been treated medically with Megace and reports the return of a normal cycle. Patient denies pelvic pain. She is sexually active without complaints. She denies urinary incontinence.   Past Medical History:  Diagnosis Date   Hypertension    Stroke [facility])    Past Surgical History:  Procedure Laterality Date   CESAREAN SECTION  1992   IR ANGIO INTRA EXTRACRAN SEL COM CAROTID INNOMINATE BILAT MOD SED  02/08/2020   IR ANGIO VERTEBRAL SEL VERTEBRAL BILAT MOD SED  02/08/2020   IR US GUIDE VASC ACCESS RIGHT  02/08/2020   Family History  Problem Relation Age of Onset   Diabetes Father    Stroke Sister    Stroke Other     Social History   Socioeconomic History   Marital status: Single    Spouse name: Not on file   Number of children: 2   Years of education: Not on file   Highest education level: Not on file  Occupational History    Comment: works at [employer]   Tobacco Use   Smoking status: Never   Smokeless tobacco: Never  Vaping Use   Vaping Use: Never used  Substance and Sexual Activity   Alcohol use: Not Currently   Drug use: Never   Sexual activity: Not on file  Other Topics Concern   Not on file  Social History Narrative   Jacqueline Medina is a 50 year old patient who works full time at [employer] 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 She lives with and has support of her daughters (79 & 3). She is independent with care needs and denies issues with transportation to medical appointments    Right handed   Social Determinants of Health   Financial Resource Strain: Not  on file  Food Insecurity: Not on file  Transportation Needs: Not on file  Physical Activity: Not on file  Stress: Not on file  Social Connections: Not on file  Intimate Partner Violence: Not on file   Health Maintenance  Topic Date Due   COVID-19 Vaccine (1) Never done   MAMMOGRAM  Never done   PAP SMEAR-Modifier  Never done   Zoster Vaccines- Shingrix (1 of 2) Never done   INFLUENZA VACCINE  Never done   TETANUS/TDAP  05/06/2022   COLONOSCOPY (Pts 45-65yrs Insurance coverage will need to be confirmed)  12/08/2028   Hepatitis C Screening  Completed   HIV Screening  Completed   Pneumococcal Vaccine 59-66 Years old  Aged Out   HPV VACCINES  Aged Out       Review of Systems Pertinent items noted in HPI and remainder of comprehensive ROS otherwise negative.   Objective:  Blood pressure 136/82, pulse 74, weight 150 lb (68 kg), last menstrual period 08/02/2021.   GENERAL: Well-developed, well-nourished female in no acute distress.  HEENT: Normocephalic, atraumatic. Sclerae anicteric.  NECK: Supple. Normal thyroid.  LUNGS: Clear to auscultation bilaterally.  HEART: Regular rate and rhythm. BREASTS: Symmetric in size. No palpable masses or lymphadenopathy, skin changes, or nipple drainage. ABDOMEN: Soft,  nontender, nondistended. No organomegaly. PELVIC: Normal external female genitalia. Vagina is pink and rugated.  Normal discharge. Normal appearing cervix. Uterus is normal in size. No adnexal mass or tenderness. Chaperone present during the pelvic exam EXTREMITIES: No cyanosis, clubbing, or edema, 2+ distal pulses.    CT Head Wo Contrast  Result Date: 07/24/2021 CLINICAL DATA:  50 year old female with new onset of neurologic deficit. Possible stroke. EXAM: CT HEAD WITHOUT CONTRAST TECHNIQUE: Contiguous axial images were obtained from the base of the skull through the vertex without intravenous contrast. COMPARISON:  CT angiography of the head and neck 02/16/2021. FINDINGS: Brain:  Several well-defined areas of low attenuation are again noted in the left basal ganglia and left corona radiata, compatible with old lacunar infarcts and chronic ischemic changes in the white matter. No evidence of acute infarction, hemorrhage, hydrocephalus, extra-axial collection or mass lesion/mass effect. Vascular: No hyperdense vessel or unexpected calcification. Skull: Normal. Negative for fracture or focal lesion. Sinuses/Orbits: No acute finding. Other: None. IMPRESSION: 1. No acute intracranial abnormalities. 2. Chronic ischemic changes in the left basal ganglia and left periventricular white matter, similar to the prior examination, as above. Electronically Signed   By: Vinnie Langton M.D.   On: 07/24/2021 12:14   MR BRAIN WO CONTRAST  Result Date: 07/24/2021 CLINICAL DATA:  Neuro deficit, stroke suspected EXAM: MRI HEAD WITHOUT CONTRAST TECHNIQUE: Multiplanar, multiecho pulse sequences of the brain and surrounding structures were obtained without intravenous contrast. COMPARISON:  02/05/2020 FINDINGS: Brain: No restricted diffusion to suggest acute infarct. No hemorrhage, mass, mass effect, or midline shift. No hydrocephalus or extra-axial collection. T2 hyperintense signal in the periventricular white matter, likely the sequela of moderate chronic small vessel ischemic disease. Vascular: Relatively diminutive left ICA and MCA, unchanged from the prior exam. Otherwise normal flow voids. Skull and upper cervical spine: Normal marrow signal. Sinuses/Orbits: Negative. Other: The mastoids are well aerated. IMPRESSION: No acute intracranial process. Electronically Signed   By: Merilyn Baba M.D.   On: 07/24/2021 19:03   US PELVIC COMPLETE WITH TRANSVAGINAL  Result Date: 06/22/2021 CLINICAL DATA:  Dysfunctional uterine bleeding, 2 weeks of heavy vaginal bleeding with clots, history of fibroids; no LMP provided; G3P2A1 EXAM: TRANSABDOMINAL AND TRANSVAGINAL ULTRASOUND OF PELVIS TECHNIQUE: Both  transabdominal and transvaginal ultrasound examinations of the pelvis were performed. Transabdominal technique was performed for global imaging of the pelvis including uterus, ovaries, adnexal regions, and pelvic cul-de-sac. It was necessary to proceed with endovaginal exam following the transabdominal exam to visualize the uterus and ovaries. COMPARISON:  None FINDINGS: Uterus Measurements: 12.2 x 6.6 x 8.0 cm = volume: 335 mL. Anteverted. Heterogeneous myometrium. Heterogeneous area of echogenicity more focally seen posterior to the endometrial complex at the mid to upper uterus, raising question of a subtle 4.2 cm diameter submucosal leiomyoma. No additional masses. Endometrium Thickness: 10 mm.  No endometrial fluid or mass Right ovary Not visualized, likely obscured by bowel Left ovary Not visualized, likely obscured by bowel Other findings No free pelvic fluid.  No adnexal masses. IMPRESSION: Heterogeneous uterus with question 4.2 cm diameter submucosal leiomyoma at posterior mid upper uterus. Remainder of exam unremarkable. Electronically Signed   By: Lavonia Dana M.D.   On: 06/22/2021 15:48    Assessment:    Healthy female exam.      Plan:    Pap smear collected Screening mammogram ordered Patient up to date on colonoscopy Reviewed ultrasound and likely source of AUB related to submucosal fibroid. Discussed continued medical management with megace vs endometrial  ablation. Patient opted with medical management Discussed benefits of endometrial biopsy to rule out malignancy as a cause of the AUB ENDOMETRIAL BIOPSY     The indications for endometrial biopsy were reviewed.   Risks of the biopsy including cramping, bleeding, infection, uterine perforation, inadequate specimen and need for additional procedures  were discussed. The patient states she understands and agrees to undergo procedure today. Consent was signed. Time out was performed. Urine HCG was negative. A sterile speculum was placed in  the patient's vagina and the cervix was prepped with Betadine. A single-toothed tenaculum was placed on the anterior lip of the cervix to stabilize it. The uterine cavity was sounded to a depth of 10 cm using the uterine sound. The 3 mm pipelle was introduced into the endometrial cavity without difficulty, 2 passes were made.  A  moderate amount of tissue was  sent to pathology. The instruments were removed from the patient's vagina. Minimal bleeding from the cervix was noted. The patient tolerated the procedure well.  Routine post-procedure instructions were given to the patient. The patient will follow up in two weeks to review the results and for further management.   Patient will be contacted with results See After Visit Summary for Counseling Recommendations

## 2021-08-09 LAB — SURGICAL PATHOLOGY

## 2021-08-14 ENCOUNTER — Telehealth: Payer: Self-pay | Admitting: *Deleted

## 2021-08-14 NOTE — Telephone Encounter (Signed)
Pt called to office with questions about bleeding after biopsy.  Pt states she is currently taking Megace 2 times daily but has continued to have a light flow since her biopsy on 11/15.  Pt states provider mentioned increasing dose of Megace but did not see noted in chart.  Pt made aware message to be sent to provider for recommendations.    Please review and advise on bleeding and Megace.

## 2021-08-15 LAB — CYTOLOGY - PAP
Comment: NEGATIVE
Diagnosis: NEGATIVE
Diagnosis: REACTIVE
High risk HPV: NEGATIVE

## 2021-08-15 NOTE — Telephone Encounter (Signed)
Per Dr Elly Modena: Jacqueline Medina can increase megace to 2 tablets in the am and 2 tablets in the pm. It is normal for increased bleeding to take place after a biopsy  Attempt to contact pt. Left detail msg, per pt, making her aware of recommendations

## 2021-08-22 ENCOUNTER — Telehealth: Payer: Self-pay | Admitting: Obstetrics and Gynecology

## 2021-08-22 ENCOUNTER — Telehealth: Payer: Self-pay | Admitting: Family Medicine

## 2021-08-22 NOTE — Telephone Encounter (Signed)
Patient called regarding her Megace prescription.  Patient states she was told she could take up to 80 mg a day, so she is currently taking two tablets BID, therefore her 30 day supply is only lasting 14 days.    I called pharmacy to confirm if her prescription from 08/07/21 was picked up but only reached voicemail to pharmacy.  Routing to provider to confirm if patient can take at the larger dose and if so to send in a new prescription that would allow her sufficient coverage.

## 2021-08-22 NOTE — Telephone Encounter (Signed)
Pt had gyn exam  with dr Roland Rack on 08-07-2021 and is calling to see if she can still have cpe with dr [name] on 09-10-2021

## 2021-08-23 ENCOUNTER — Other Ambulatory Visit: Payer: Self-pay | Admitting: Obstetrics and Gynecology

## 2021-08-23 MED ORDER — MEGESTROL ACETATE 20 MG PO TABS: 20.0000 mg | ORAL_TABLET | Freq: Two times a day (BID) | ORAL | 11 refills | Status: DC

## 2021-08-30 ENCOUNTER — Other Ambulatory Visit: Payer: Self-pay | Admitting: Family Medicine

## 2021-09-04 ENCOUNTER — Telehealth (HOSPITAL_COMMUNITY): Payer: Self-pay

## 2021-09-04 NOTE — Telephone Encounter (Signed)
Called to schedule cta head/neck, no answer, left vm. AW 

## 2021-09-07 NOTE — Progress Notes (Signed)
HPI: Jacqueline Medina is a 50 y.o. female, who is here today for her routine physical.  Last CPE: 08/15/20  Regular exercise 3 or more time per week: She does some "stretching" in the morning 2-3 sets of 15 repetitions. Following a healthy diet: Yes, she cooks here meals. She lives with her daughter and grandson.   Chronic medical problems: HTN,HLD,moya moya disease,CVA,Fabry disease,and anxiety among some. CVA in 01/2020, no significant residual deficit. She follows with neurologist.  Immunization History  Administered Date(s) Administered   DTaP 05/06/2012   Tdap 05/06/2012   Health Maintenance  Topic Date Due   MAMMOGRAM  Never done   COVID-19 Vaccine (1) 09/26/2021 (Originally 09/19/1971)   INFLUENZA VACCINE  12/21/2021 (Originally 04/23/2021)   Zoster Vaccines- Shingrix (1 of 2) 09/28/2028 (Originally 03/21/2019)   TETANUS/TDAP  05/06/2022   PAP SMEAR-Modifier  08/07/2024   COLONOSCOPY (Pts 45-2yrs Insurance coverage will need to be confirmed)  12/08/2028   Hepatitis C Screening  Completed   HIV Screening  Completed   Pneumococcal Vaccine 69-58 Years old  Aged Out   HPV VACCINES  Aged Out   Established with gyn, Dr Elly Modena, last seen on 08/07/21.  HTN on Lisinopril 20 mg and Amlodipine 5 mg daily. Requesting intermittent FMLA completed in case BP goes up.  Lab Results  Component Value Date   CREATININE 0.94 07/25/2021   BUN 13 07/25/2021   NA 137 07/25/2021   K 3.8 07/25/2021   CL 109 07/25/2021   CO2 25 07/25/2021   She is on iron supplementation. DUB on Megestrol 40 mg bid. According to pt, she discussed treatment options with her gyn and she declined surgical treatment for fibroids. She had vaginal spotting from 12/14 until yesterday.  Lab Results  Component Value Date   WBC 5.1 08/07/2021   HGB 10.9 (L) 08/07/2021   HCT 33.9 (L) 08/07/2021   MCV 93.7 08/07/2021   PLT 246.0 08/07/2021   Lab Results  Component Value Date   CHOL 127  08/15/2020   HDL 52 08/15/2020   LDLCALC 59 08/15/2020   TRIG 82 08/15/2020   CHOLHDL 2.4 08/15/2020   HTN on Lisinopril 20 mg daily and Amlodipine 5 mg daily.  Review of Systems  Constitutional:  Positive for fatigue. Negative for appetite change and fever.  HENT:  Negative for hearing loss, mouth sores, sore throat, trouble swallowing and voice change.   Eyes:  Negative for redness and visual disturbance.  Respiratory:  Negative for cough, shortness of breath and wheezing.   Cardiovascular:  Negative for chest pain, palpitations and leg swelling.  Gastrointestinal:  Negative for abdominal pain, nausea and vomiting.       No changes in bowel habits.  Endocrine: Negative for cold intolerance, heat intolerance, polydipsia, polyphagia and polyuria.  Genitourinary:  Negative for decreased urine volume, dysuria, hematuria, vaginal bleeding and vaginal discharge.  Musculoskeletal:  Negative for gait problem and myalgias.  Skin:  Negative for color change and rash.  Allergic/Immunologic: Negative for environmental allergies.  Neurological:  Negative for syncope, weakness and headaches.  Hematological:  Negative for adenopathy. Does not bruise/bleed easily.  Psychiatric/Behavioral:  Negative for confusion. The patient is nervous/anxious.   All other systems reviewed and are negative.  Current Outpatient Medications on File Prior to Visit  Medication Sig Dispense Refill   amLODipine (NORVASC) 5 MG tablet Take 1 tablet (5 mg total) by mouth at bedtime. 90 tablet 3   atorvastatin (LIPITOR) 80 MG tablet Take 1 tablet (80  mg total) by mouth daily. 90 tablet 2   busPIRone (BUSPAR) 5 MG tablet 1 tab am and 2 tabs pm. 180 tablet 1   clopidogrel (PLAVIX) 75 MG tablet TAKE ONE TABLET BY MOUTH DAILY 90 tablet 1   lisinopril (ZESTRIL) 20 MG tablet Take 1 tablet (20 mg total) by mouth daily. 90 tablet 2   Multiple Vitamins-Minerals (MULTIVITAMIN ADULTS) TABS Take 1 tablet by mouth daily.     No  current facility-administered medications on file prior to visit.   Past Medical History:  Diagnosis Date   Hypertension    Stroke [facility])    Past Surgical History:  Procedure Laterality Date   CESAREAN SECTION  1992   IR ANGIO INTRA EXTRACRAN SEL COM CAROTID INNOMINATE BILAT MOD SED  02/08/2020   IR ANGIO VERTEBRAL SEL VERTEBRAL BILAT MOD SED  02/08/2020   IR US GUIDE VASC ACCESS RIGHT  02/08/2020   No Known Allergies  Family History  Problem Relation Age of Onset   Diabetes Father    Stroke Sister    Stroke Other     Social History   Socioeconomic History   Marital status: Single    Spouse name: Not on file   Number of children: 2   Years of education: Not on file   Highest education level: Not on file  Occupational History    Comment: works at [employer]   Tobacco Use   Smoking status: Never   Smokeless tobacco: Never  Vaping Use   Vaping Use: Never used  Substance and Sexual Activity   Alcohol use: Not Currently   Drug use: Never   Sexual activity: Not on file  Other Topics Concern   Not on file  Social History Narrative   [name] is a 50 year old patient who works full time at [employer] 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 She lives with and has support of her daughters (65 & 5). She is independent with care needs and denies issues with transportation to medical appointments    Right handed   Social Determinants of Health   Financial Resource Strain: Not on file  Food Insecurity: Not on file  Transportation Needs: Not on file  Physical Activity: Not on file  Stress: Not on file  Social Connections: Not on file   Vitals:   09/10/21 0903  BP: 128/80  Pulse: 83  Resp: 16  SpO2: 99%   Body mass index is 24.13 kg/m.  Wt Readings from Last 3 Encounters:  09/10/21 149 lb 8 oz (67.8 kg)  08/07/21 150 lb (68 kg)  07/25/21 148 lb 5.9 oz (67.3 kg)   Physical Exam Vitals and nursing note reviewed.  Constitutional:      General:  She is not in acute distress.    Appearance: She is well-developed.  HENT:     Head: Normocephalic and atraumatic.     Right Ear: Hearing, tympanic membrane, ear canal and external ear normal.     Left Ear: Hearing, tympanic membrane, ear canal and external ear normal.     Mouth/Throat:     Mouth: Mucous membranes are moist.     Pharynx: Oropharynx is clear. Uvula midline.  Eyes:     Extraocular Movements: Extraocular movements intact.     Conjunctiva/sclera: Conjunctivae normal.     Pupils: Pupils are equal, round, and reactive to light.  Neck:     Thyroid: No thyromegaly.     Trachea: No tracheal deviation.  Cardiovascular:     Rate and Rhythm: Normal rate and regular rhythm.     Pulses:          Dorsalis pedis pulses are 2+ on the right side and 2+ on the left side.     Heart sounds: Murmur (soft SEM RUSB.) heard.  Pulmonary:     Effort: Pulmonary effort is normal. No respiratory distress.     Breath sounds: Normal breath sounds.  Abdominal:     Palpations: Abdomen is soft. There is no hepatomegaly or mass.     Tenderness: There is no abdominal tenderness.  Genitourinary:    Comments: Deferred to gyn. Musculoskeletal:     Comments: No major deformity or signs of synovitis appreciated.  Lymphadenopathy:     Cervical: No cervical adenopathy.     Upper Body:     Right upper body: No supraclavicular adenopathy.     Left upper body: No supraclavicular adenopathy.  Skin:    General: Skin is warm.     Findings: No erythema or rash.  Neurological:     General: No focal deficit present.     Mental Status: She is alert and oriented to person, place, and time.     Cranial Nerves: No cranial nerve deficit.     Coordination: Coordination normal.     Gait: Gait normal.     Deep Tendon Reflexes:     Reflex Scores:      Bicep reflexes are 2+ on the right side and 2+ on the left side.      Patellar reflexes are 2+ on the right side and 2+ on the left side. Psychiatric:         Speech: Speech normal.     Comments: Well groomed, good eye contact.   ASSESSMENT AND PLAN:  Ms. Shanikia Kernodle was here today annual physical examination.  Orders Placed This Encounter  Procedures   CBC   Hemoglobin A1c   Lipid panel   Lab Results  Component Value Date   HGBA1C 4.8 09/10/2021   Lab Results  Component Value Date   CHOL 112 09/10/2021   HDL 39.70 09/10/2021   LDLCALC 56 09/10/2021   TRIG 81.0 09/10/2021   CHOLHDL 3 09/10/2021   Lab Results  Component Value Date   WBC 6.0 09/10/2021   HGB 12.2 09/10/2021   HCT 37.2 09/10/2021   MCV 91.1 09/10/2021   PLT 232.0 09/10/2021   Routine general medical examination at a health care facility We discussed the importance of regular physical activity and healthy diet for prevention of chronic illness and/or complications. Preventive guidelines reviewed. Vaccination up to date. She needs to schedule her mammogram, already ordered by Dr Elly Modena. Ca++ and vit D supplementation to continue. Next CPE in a year.  Screening for endocrine, metabolic and immunity disorder -     Hemoglobin A1c  Hyperlipidemia LDL goal < 70. Continue Atorvastatin 80 mg daily and low fat diet.  Anemia, iron deficiency Continue Fe Sulfate 325 mg daily. Further recommendations according to CBC result.  Essential hypertension, benign BP adequately controlled. Explained that intermittent FMLA is not indicated at this time. Continue current management.  Return in 6 months (on 03/11/2022) for HTN.  Glady Ouderkirk G. [name], MD  [facility]. [place] office.

## 2021-09-10 ENCOUNTER — Encounter: Payer: Self-pay | Admitting: Family Medicine

## 2021-09-10 ENCOUNTER — Telehealth (HOSPITAL_COMMUNITY): Payer: Self-pay

## 2021-09-10 ENCOUNTER — Encounter: Payer: Self-pay | Admitting: Obstetrics and Gynecology

## 2021-09-10 ENCOUNTER — Telehealth: Payer: BC Managed Care – PPO | Admitting: Obstetrics and Gynecology

## 2021-09-10 ENCOUNTER — Ambulatory Visit (INDEPENDENT_AMBULATORY_CARE_PROVIDER_SITE_OTHER): Payer: BC Managed Care – PPO | Admitting: Family Medicine

## 2021-09-10 ENCOUNTER — Other Ambulatory Visit (HOSPITAL_COMMUNITY): Payer: Self-pay | Admitting: Interventional Radiology

## 2021-09-10 VITALS — BP 128/80 | HR 83 | Resp 16 | Ht 66.0 in | Wt 149.5 lb

## 2021-09-10 DIAGNOSIS — Z130 Encounter for screening for diseases of the blood and blood-forming organs and certain disorders involving the immune mechanism: Secondary | ICD-10-CM | POA: Diagnosis not present

## 2021-09-10 DIAGNOSIS — E785 Hyperlipidemia, unspecified: Secondary | ICD-10-CM

## 2021-09-10 DIAGNOSIS — I771 Stricture of artery: Secondary | ICD-10-CM

## 2021-09-10 DIAGNOSIS — Z13228 Encounter for screening for other metabolic disorders: Secondary | ICD-10-CM | POA: Diagnosis not present

## 2021-09-10 DIAGNOSIS — D509 Iron deficiency anemia, unspecified: Secondary | ICD-10-CM | POA: Diagnosis not present

## 2021-09-10 DIAGNOSIS — Z1329 Encounter for screening for other suspected endocrine disorder: Secondary | ICD-10-CM | POA: Diagnosis not present

## 2021-09-10 DIAGNOSIS — Z0000 Encounter for general adult medical examination without abnormal findings: Secondary | ICD-10-CM

## 2021-09-10 DIAGNOSIS — I10 Essential (primary) hypertension: Secondary | ICD-10-CM

## 2021-09-10 DIAGNOSIS — N939 Abnormal uterine and vaginal bleeding, unspecified: Secondary | ICD-10-CM | POA: Diagnosis not present

## 2021-09-10 LAB — LIPID PANEL
Cholesterol: 112 mg/dL (ref 0–200)
HDL: 39.7 mg/dL (ref >39.00)
LDL Cholesterol: 56 mg/dL (ref 0–99)
NonHDL: 72.53
Total CHOL/HDL Ratio: 3
Triglycerides: 81 mg/dL (ref 0.0–149.0)
VLDL: 16.2 mg/dL (ref 0.0–40.0)

## 2021-09-10 LAB — CBC
HCT: 37.2 % (ref 36.0–46.0)
Hemoglobin: 12.2 g/dL (ref 12.0–15.0)
MCHC: 32.8 g/dL (ref 30.0–36.0)
MCV: 91.1 fl (ref 78.0–100.0)
Platelets: 232 10*3/uL (ref 150.0–400.0)
RBC: 4.09 Mil/uL (ref 3.87–5.11)
RDW: 14.7 % (ref 11.5–15.5)
WBC: 6 10*3/uL (ref 4.0–10.5)

## 2021-09-10 LAB — HEMOGLOBIN A1C: Hgb A1c MFr Bld: 4.8 % (ref 4.6–6.5)

## 2021-09-10 MED ORDER — MEGESTROL ACETATE 40 MG PO TABS: 40.0000 mg | ORAL_TABLET | Freq: Two times a day (BID) | ORAL | 5 refills | Status: DC

## 2021-09-10 NOTE — Telephone Encounter (Signed)
Pt just had recent mri/ct of the head done when she was in the hospital. She wants to know if she still needs f/u imaging. Will send to our PA to advise and call pt back. AW

## 2021-09-10 NOTE — Patient Instructions (Addendum)
A few things to remember from today's visit:  Routine general medical examination at a health care facility  Hyperlipidemia, unspecified hyperlipidemia type - Plan: Lipid panel  Essential hypertension  Iron deficiency anemia, unspecified iron deficiency anemia type - Plan: CBC  Screening for endocrine, metabolic and immunity disorder - Plan: Hemoglobin A1c  If you need refills please call your pharmacy. Do not use My Chart to request refills or for acute issues that need immediate attention.   Please arrange your mammogram.  Please be sure medication list is accurate. If a new problem present, please set up appointment sooner than planned today.  Health Maintenance, Female Adopting a healthy lifestyle and getting preventive care are important in promoting health and wellness. Ask your health care provider about: The right schedule for you to have regular tests and exams. Things you can do on your own to prevent diseases and keep yourself healthy. What should I know about diet, weight, and exercise? Eat a healthy diet  Eat a diet that includes plenty of vegetables, fruits, low-fat dairy products, and lean protein. Do not eat a lot of foods that are high in solid fats, added sugars, or sodium. Maintain a healthy weight Body mass index (BMI) is used to identify weight problems. It estimates body fat based on height and weight. Your health care provider can help determine your BMI and help you achieve or maintain a healthy weight. Get regular exercise Get regular exercise. This is one of the most important things you can do for your health. Most adults should: Exercise for at least 150 minutes each week. The exercise should increase your heart rate and make you sweat (moderate-intensity exercise). Do strengthening exercises at least twice a week. This is in addition to the moderate-intensity exercise. Spend less time sitting. Even light physical activity can be beneficial. Watch  cholesterol and blood lipids Have your blood tested for lipids and cholesterol at 52 years of age, then have this test every 5 years. Have your cholesterol levels checked more often if: Your lipid or cholesterol levels are high. You are older than 52 years of age. You are at high risk for heart disease. What should I know about cancer screening? Depending on your health history and family history, you may need to have cancer screening at various ages. This may include screening for: Breast cancer. Cervical cancer. Colorectal cancer. Skin cancer. Lung cancer. What should I know about heart disease, diabetes, and high blood pressure? Blood pressure and heart disease High blood pressure causes heart disease and increases the risk of stroke. This is more likely to develop in people who have high blood pressure readings or are overweight. Have your blood pressure checked: Every 3-5 years if you are 93-81 years of age. Every year if you are 50 years old or older. Diabetes Have regular diabetes screenings. This checks your fasting blood sugar level. Have the screening done: Once every three years after age 30 if you are at a normal weight and have a low risk for diabetes. More often and at a younger age if you are overweight or have a high risk for diabetes. What should I know about preventing infection? Hepatitis B If you have a higher risk for hepatitis B, you should be screened for this virus. Talk with your health care provider to find out if you are at risk for hepatitis B infection. Hepatitis C Testing is recommended for: Everyone born from [birth year] through 1967. Anyone with known risk factors for hepatitis C.  Sexually transmitted infections (STIs) Get screened for STIs, including gonorrhea and chlamydia, if: You are sexually active and are younger than 52 years of age. You are older than 52 years of age and your health care provider tells you that you are at risk for this type of  infection. Your sexual activity has changed since you were last screened, and you are at increased risk for chlamydia or gonorrhea. Ask your health care provider if you are at risk. Ask your health care provider about whether you are at high risk for HIV. Your health care provider may recommend a prescription medicine to help prevent HIV infection. If you choose to take medicine to prevent HIV, you should first get tested for HIV. You should then be tested every 3 months for as long as you are taking the medicine. Pregnancy If you are about to stop having your period (premenopausal) and you may become pregnant, seek counseling before you get pregnant. Take 400 to 800 micrograms (mcg) of folic acid every day if you become pregnant. Ask for birth control (contraception) if you want to prevent pregnancy. Osteoporosis and menopause Osteoporosis is a disease in which the bones lose minerals and strength with aging. This can result in bone fractures. If you are 83 years old or older, or if you are at risk for osteoporosis and fractures, ask your health care provider if you should: Be screened for bone loss. Take a calcium or vitamin D supplement to lower your risk of fractures. Be given hormone replacement therapy (HRT) to treat symptoms of menopause. Follow these instructions at home: Alcohol use Do not drink alcohol if: Your health care provider tells you not to drink. You are pregnant, may be pregnant, or are planning to become pregnant. If you drink alcohol: Limit how much you have to: 0-1 drink a day. Know how much alcohol is in your drink. In the [place] one drink equals one 12 oz bottle of beer (355 mL), one 5 oz glass of wine (148 mL), or one 1 oz glass of hard liquor (44 mL). Lifestyle Do not use any products that contain nicotine or tobacco. These products include cigarettes, chewing tobacco, and vaping devices, such as e-cigarettes. If you need help quitting, ask your health care  provider. Do not use street drugs. Do not share needles. Ask your health care provider for help if you need support or information about quitting drugs. General instructions Schedule regular health, dental, and eye exams. Stay current with your vaccines. Tell your health care provider if: You often feel depressed. You have ever been abused or do not feel safe at home. Summary Adopting a healthy lifestyle and getting preventive care are important in promoting health and wellness. Follow your health care provider's instructions about healthy diet, exercising, and getting tested or screened for diseases. Follow your health care provider's instructions on monitoring your cholesterol and blood pressure. This information is not intended to replace advice given to you by your health care provider. Make sure you discuss any questions you have with your health care provider. Document Revised: 01/29/2021 Document Reviewed: 01/29/2021 Elsevier Patient Education  [place].

## 2021-09-10 NOTE — Assessment & Plan Note (Signed)
LDL goal < 70. Continue Atorvastatin 80 mg daily and low fat diet.

## 2021-09-10 NOTE — Progress Notes (Signed)
TELEHEALTH GYNECOLOGY VISIT ENCOUNTER NOTE  Provider location: [facility] [place] [facility]   Patient location: Home  I connected with [name] on 09/10/21 at  2:30 PM EST by telephone and verified that I am speaking with the correct person using two identifiers. Patient was unable to do MyChart audiovisual encounter due to technical difficulties, she tried several times.    I discussed the limitations, risks, security and privacy concerns of performing an evaluation and management service by telephone and the availability of in person appointments. I also discussed with the patient that there may be a patient responsible charge related to this service. The patient expressed understanding and agreed to proceed.   History:  Jacqueline Medina is a 50 y.o. G45P2000 female being evaluated today for F/U of EMBX and DUB with uterine fibroids. She reports taking Megace 20 mg bid. Still having some daily bleeding. Depo Provera 150 mg IM, 07/23/21    Past Medical History:  Diagnosis Date   Hypertension    Stroke [facility])    Past Surgical History:  Procedure Laterality Date   CESAREAN SECTION  1992   IR ANGIO INTRA EXTRACRAN SEL COM CAROTID INNOMINATE BILAT MOD SED  02/08/2020   IR ANGIO VERTEBRAL SEL VERTEBRAL BILAT MOD SED  02/08/2020   IR US GUIDE VASC ACCESS RIGHT  02/08/2020   The following portions of the patient's history were reviewed and updated as appropriate: allergies, current medications, past family history, past medical history, past social history, past surgical history and problem list.    Review of Systems:  Pertinent items noted in HPI and remainder of comprehensive ROS otherwise negative.  Physical Exam:   General:  Alert, oriented and cooperative.   Mental Status: Normal mood and affect perceived. Normal judgment and thought content.  Physical exam deferred due to nature of the encounter  Labs and Imaging Results for orders placed or performed  in visit on 09/10/21 (from the past 336 hour(s))  Lipid panel   Collection Time: 09/10/21  9:51 AM  Result Value Ref Range   Cholesterol 112 0 - 200 mg/dL   Triglycerides 81.0 0.0 - 149.0 mg/dL   HDL 39.70 >39.00 mg/dL   VLDL 16.2 0.0 - 40.0 mg/dL   LDL Cholesterol 56 0 - 99 mg/dL   Total CHOL/HDL Ratio 3    NonHDL 72.53   Hemoglobin A1c   Collection Time: 09/10/21  9:51 AM  Result Value Ref Range   Hgb A1c MFr Bld 4.8 4.6 - 6.5 %  CBC   Collection Time: 09/10/21  9:51 AM  Result Value Ref Range   WBC 6.0 4.0 - 10.5 K/uL   RBC 4.09 3.87 - 5.11 Mil/uL   Platelets 232.0 150.0 - 400.0 K/uL   Hemoglobin 12.2 12.0 - 15.0 g/dL   HCT 37.2 36.0 - 46.0 %   MCV 91.1 78.0 - 100.0 fl   MCHC 32.8 30.0 - 36.0 g/dL   RDW 14.7 11.5 - 15.5 %   No results found.    Assessment and Plan:     1. Vaginal bleeding Will increase Megace - megestrol (MEGACE) 40 MG tablet; Take 1 tablet (40 mg total) by mouth 2 (two) times daily. Can increase to two tablets twice a day in the event of heavy bleeding  Dispense: 60 tablet; Refill: 5       I discussed the assessment and treatment plan with the patient. The patient was provided an opportunity to ask questions and all were answered.  The patient agreed with the plan and demonstrated an understanding of the instructions.   The patient was advised to call back or seek an in-person evaluation/go to the ED if the symptoms worsen or if the condition fails to improve as anticipated.  I provided 10 minutes of non-face-to-face time during this encounter.   Chancy Milroy, MD [facility] [place], [place]

## 2021-09-10 NOTE — Progress Notes (Signed)
Bleeding since 08/07/21, Megace helped some but still bleeding every day. Using 1-2 pads per day.

## 2021-09-10 NOTE — Assessment & Plan Note (Signed)
Continue Fe Sulfate 325 mg daily. Further recommendations according to CBC result.

## 2021-09-10 NOTE — Assessment & Plan Note (Signed)
BP adequately controlled. Explained that intermittent FMLA is not indicated at this time. Continue current management.

## 2021-09-12 ENCOUNTER — Encounter: Payer: BC Managed Care – PPO | Admitting: Family Medicine

## 2021-09-18 ENCOUNTER — Other Ambulatory Visit: Payer: Self-pay | Admitting: Neurology

## 2021-09-18 DIAGNOSIS — I10 Essential (primary) hypertension: Secondary | ICD-10-CM

## 2021-09-18 NOTE — Telephone Encounter (Signed)
Rx refilled.

## 2021-09-23 ENCOUNTER — Other Ambulatory Visit: Payer: Self-pay | Admitting: Family Medicine

## 2021-09-23 DIAGNOSIS — F419 Anxiety disorder, unspecified: Secondary | ICD-10-CM

## 2021-10-10 ENCOUNTER — Telehealth: Payer: Self-pay

## 2021-10-10 NOTE — Telephone Encounter (Signed)
Returned call, no answer, unable to leave vm

## 2021-10-22 ENCOUNTER — Encounter: Payer: Self-pay | Admitting: Obstetrics and Gynecology

## 2021-10-22 ENCOUNTER — Other Ambulatory Visit: Payer: Self-pay

## 2021-10-22 ENCOUNTER — Ambulatory Visit: Payer: BC Managed Care – PPO | Admitting: Obstetrics and Gynecology

## 2021-10-22 VITALS — BP 136/86 | HR 67 | Ht 66.0 in | Wt 151.2 lb

## 2021-10-22 DIAGNOSIS — N939 Abnormal uterine and vaginal bleeding, unspecified: Secondary | ICD-10-CM

## 2021-10-22 MED ORDER — MEGESTROL ACETATE 40 MG PO TABS: 40.0000 mg | ORAL_TABLET | Freq: Two times a day (BID) | ORAL | 5 refills | Status: DC

## 2021-10-22 NOTE — Progress Notes (Signed)
51 yo P2 here to follow up on AUB. Patient reports persistent vaginal bleeding on megace and is ready for surgical intervention. Patient describes the vaginal bleeding as intermittent spotting. She denies pelvic pain or abnormal discharge. Patient had an endometrial biopsy which was negative for malignancy or hyperplasia in the fall and a pelvic ultrasound which revealed a small submucosal fibroid.  Past Medical History:  Diagnosis Date   Hypertension    Stroke [facility])    Past Surgical History:  Procedure Laterality Date   CESAREAN SECTION  1992   IR ANGIO INTRA EXTRACRAN SEL COM CAROTID INNOMINATE BILAT MOD SED  02/08/2020   IR ANGIO VERTEBRAL SEL VERTEBRAL BILAT MOD SED  02/08/2020   IR US GUIDE VASC ACCESS RIGHT  02/08/2020   Family History  Problem Relation Age of Onset   Diabetes Father    Stroke Sister    Stroke Other    Social History   Tobacco Use   Smoking status: Never   Smokeless tobacco: Never  Vaping Use   Vaping Use: Never used  Substance Use Topics   Alcohol use: Not Currently   Drug use: Never   ROS See pertinent in HPI. All other systems reviewed and non contributory  Blood pressure 136/86, pulse 67, height 5\' 6"  (1.676 m), weight 151 lb 3.2 oz (68.6 kg). GENERAL: Well-developed, well-nourished female in no acute distress.  NEURO: Alert and oriented x 3  A/P 51 yo with persistent AUB on megace - Patient is interested in surgical intervention with endometrial ablation - Risks, benefits and alternatives were explained including but not limited to risks of bleeding, infection, uterine perforation and damage to adjacent organs. - Patient is getting married on 53/18/23 and desires the procedure to be done ASAP- Informed patient of scheduling limitations but will try. Patient understands that it may take place at end of February or March - Patient desires to continue with Megace for now- refill provided

## 2021-10-22 NOTE — Progress Notes (Signed)
Reports bleeding since last visit/ EMB. Recent US also. Fibroids. Dr. Rip Harbour increased Megace. Patient says it had not helped.

## 2021-10-23 ENCOUNTER — Other Ambulatory Visit: Payer: Self-pay

## 2021-10-23 DIAGNOSIS — Z1231 Encounter for screening mammogram for malignant neoplasm of breast: Secondary | ICD-10-CM

## 2021-10-23 DIAGNOSIS — Z01419 Encounter for gynecological examination (general) (routine) without abnormal findings: Secondary | ICD-10-CM

## 2021-11-15 ENCOUNTER — Telehealth: Payer: Self-pay | Admitting: *Deleted

## 2021-11-15 NOTE — Telephone Encounter (Signed)
TC from patient reporting that AUB has stopped. Reports increased dose of Megace is effective. Request cancellation of endometrial ablation. Dr. Elly Modena notified. Surgery scheduler notified.  Patient also request order for Mammo be sent to a [place], [place] location. Will call back with number to fax order to.

## 2021-11-15 NOTE — Telephone Encounter (Signed)
TC from patient requesting order for mammo to be faxed to  [facility], [place]. Order printed and faxed to [phone] per pt request.

## 2021-11-23 ENCOUNTER — Encounter: Payer: Self-pay | Admitting: Obstetrics and Gynecology

## 2021-12-04 ENCOUNTER — Ambulatory Visit (HOSPITAL_BASED_OUTPATIENT_CLINIC_OR_DEPARTMENT_OTHER): Admit: 2021-12-04 | Payer: BC Managed Care – PPO | Admitting: Obstetrics and Gynecology

## 2021-12-04 ENCOUNTER — Encounter (HOSPITAL_BASED_OUTPATIENT_CLINIC_OR_DEPARTMENT_OTHER): Payer: Self-pay

## 2021-12-04 SURGERY — DILATATION & CURETTAGE/HYSTEROSCOPY WITH HYDROTHERMAL ABLATION
Anesthesia: Choice

## 2021-12-13 ENCOUNTER — Other Ambulatory Visit: Payer: Self-pay | Admitting: Family Medicine

## 2021-12-13 ENCOUNTER — Telehealth: Payer: Self-pay

## 2021-12-13 DIAGNOSIS — F419 Anxiety disorder, unspecified: Secondary | ICD-10-CM

## 2021-12-13 NOTE — Telephone Encounter (Signed)
Returned call and pt stated that she has started bleeding again x 2 weeks despite taking Megace. Pt was originally scheduled for ablation but cancelled, pt would now like to proceed. Reached out to see if pt can be placed back on schedule or if she has to see provider first. Advised pt we would follow up. ?

## 2021-12-18 ENCOUNTER — Other Ambulatory Visit: Payer: Self-pay | Admitting: Family Medicine

## 2021-12-18 DIAGNOSIS — E785 Hyperlipidemia, unspecified: Secondary | ICD-10-CM

## 2021-12-19 ENCOUNTER — Encounter (HOSPITAL_BASED_OUTPATIENT_CLINIC_OR_DEPARTMENT_OTHER): Payer: Self-pay | Admitting: Obstetrics and Gynecology

## 2021-12-21 ENCOUNTER — Other Ambulatory Visit: Payer: Self-pay | Admitting: *Deleted

## 2021-12-21 ENCOUNTER — Encounter (HOSPITAL_BASED_OUTPATIENT_CLINIC_OR_DEPARTMENT_OTHER): Payer: Self-pay | Admitting: Obstetrics and Gynecology

## 2021-12-21 ENCOUNTER — Other Ambulatory Visit: Payer: Self-pay

## 2021-12-21 ENCOUNTER — Encounter (HOSPITAL_BASED_OUTPATIENT_CLINIC_OR_DEPARTMENT_OTHER)
Admission: RE | Admit: 2021-12-21 | Discharge: 2021-12-21 | Disposition: A | Discharge status: Home / Self Care | Payer: BC Managed Care – PPO | Source: Ambulatory Visit | Attending: Obstetrics and Gynecology | Admitting: Obstetrics and Gynecology

## 2021-12-21 ENCOUNTER — Telehealth: Payer: Self-pay | Admitting: Neurology

## 2021-12-21 ENCOUNTER — Telehealth: Payer: Self-pay | Admitting: *Deleted

## 2021-12-21 DIAGNOSIS — Z01812 Encounter for preprocedural laboratory examination: Secondary | ICD-10-CM | POA: Diagnosis present

## 2021-12-21 LAB — TYPE AND SCREEN
ABO/RH(D): O POS
Antibody Screen: NEGATIVE

## 2021-12-21 NOTE — Progress Notes (Signed)
Message from pt asking about stopping Plavix prior to surgery 12/25/21. Dr. Elly Modena was consulted. She does not feel the medication needs to be stopped prior to this procedure from GYN standpoint. Patient notified. Pt reassured that pre admission team would review her history and medications prior to surgery. Per request of [facility] pre admission nurse Tammy, pt given direct number [phone] and encouraged to call to complete pre admission as she has not returned their calls. ?

## 2021-12-21 NOTE — Telephone Encounter (Signed)
On April 4th pt is scheduled for a similar procedure to a [facility] due to bleeding.  Pt is asking if she needs to come off of her clopidogrel (PLAVIX) 75 MG tablet for a while, please call.  Pt aware the office is not open today. ?

## 2021-12-21 NOTE — Progress Notes (Signed)

## 2021-12-21 NOTE — Telephone Encounter (Signed)
Returned TC to patient asking about taking Plavix prior to scheduled surgery on 12/25/21. Pt reassured that pre admissions nurse would be calling to discuss health history and medications and that she would be well informed of what to do prior to the date of surgery. Call placed to [facility] to confirm. ?

## 2021-12-24 NOTE — Telephone Encounter (Addendum)
Left a message for a return call.

## 2021-12-24 NOTE — H&P (Signed)
Jacqueline Medina is an 51 y.o. female P2 with abnormal uterine bleeding here for endometrial ablation. Patient failed medical management with megace. She describes her vaginal bleeding as intermittent spotting on megace. She denies pelvic pain or abnormal discharge. Patient is without any other complaints ? ?Pertinent Gynecological History: ?Last mammogram: normal Date: 07/2019 ?Last pap: normal Date: 07/2021 ? ? ?Menstrual History: ? ?No LMP recorded. (Menstrual status: Irregular Periods). ?  ? ?Past Medical History:  ?Diagnosis Date  ? Abnormal uterine bleeding (AUB)   ? since 07-2021  ? Anxiety   ? GERD (gastroesophageal reflux disease)   ? OTC meds  ? Hyperlipidemia   ? Hypertension   ? Moya moya disease   ? PONV (postoperative nausea and vomiting)   ? Stroke [facility])   ? no deficits now  ? ? ?Past Surgical History:  ?Procedure Laterality Date  ? [place]  ? IR ANGIO INTRA EXTRACRAN SEL COM CAROTID INNOMINATE BILAT MOD SED  02/08/2020  ? IR ANGIO VERTEBRAL SEL VERTEBRAL BILAT MOD SED  02/08/2020  ? IR US GUIDE VASC ACCESS RIGHT  02/08/2020  ? ? ?Family History  ?Problem Relation Age of Onset  ? Diabetes Father   ? Stroke Sister   ? Stroke Other   ? ? ?Social History:  reports that she has never smoked. She has never used smokeless tobacco. She reports that she does not currently use alcohol. She reports that she does not use drugs. ? ?Allergies: No Known Allergies ? ?Medications Prior to Admission  ?Medication Sig Dispense Refill Last Dose  ? amLODipine (NORVASC) 5 MG tablet Take 1 tablet (5 mg total) by mouth at bedtime. 90 tablet 3 12/25/2021  ? atorvastatin (LIPITOR) 80 MG tablet TAKE ONE TABLET BY MOUTH DAILY 90 tablet 3 12/24/2021  ? busPIRone (BUSPAR) 5 MG tablet TAKE ONE TABLET BY MOUTH EVERY MORNING TAKE TWO TABLETS BY MOUTH EVERY EVENING 90 tablet 6 12/25/2021  ? clopidogrel (PLAVIX) 75 MG tablet TAKE ONE TABLET BY MOUTH DAILY 90 tablet 1 12/24/2021  ? lisinopril (ZESTRIL) 20 MG tablet TAKE ONE TABLET BY  MOUTH DAILY 90 tablet 2 12/24/2021  ? megestrol (MEGACE) 40 MG tablet Take 1 tablet (40 mg total) by mouth 2 (two) times daily. Can increase to two tablets twice a day in the event of heavy bleeding (Patient taking differently: Take 80 mg by mouth 2 (two) times daily. Can increase to two tablets twice a day in the event of heavy bleeding) 120 tablet 5 12/24/2021  ? Multiple Vitamins-Minerals (MULTIVITAMIN ADULTS) TABS Take 1 tablet by mouth daily.   Past Week  ? ? ?Review of Systems ? ?Blood pressure 127/87, pulse 70, temperature 98.4 ?F (36.9 ?C), temperature source Oral, resp. rate 16, height '5\' 6"'$  (1.676 m), weight 72 kg, SpO2 100 %. ?Physical Exam ? ?Results for orders placed or performed during the hospital encounter of 12/25/21 (from the past 24 hour(s))  ?Pregnancy, urine POC     Status: None  ? Collection Time: 12/25/21  6:39 AM  ?Result Value Ref Range  ? Preg Test, Ur NEGATIVE NEGATIVE  ? ? ?No results found. ?05/2021 ?CLINICAL DATA:  Dysfunctional uterine bleeding, 2 weeks of heavy ?vaginal bleeding with clots, history of fibroids; no LMP provided; ?G3P2A1 ?  ?EXAM: ?TRANSABDOMINAL AND TRANSVAGINAL ULTRASOUND OF PELVIS ?  ?TECHNIQUE: ?Both transabdominal and transvaginal ultrasound examinations of the ?pelvis were performed. Transabdominal technique was performed for ?global imaging of the pelvis including uterus, ovaries, adnexal ?regions, and pelvic  cul-de-sac. It was necessary to proceed with ?endovaginal exam following the transabdominal exam to visualize the ?uterus and ovaries. ?  ?COMPARISON:  None ?  ?FINDINGS: ?Uterus ?  ?Measurements: 12.2 x 6.6 x 8.0 cm = volume: 335 mL. Anteverted. ?Heterogeneous myometrium. Heterogeneous area of echogenicity more ?focally seen posterior to the endometrial complex at the mid to ?upper uterus, raising question of a subtle 4.2 cm diameter ?submucosal leiomyoma. No additional masses. ?  ?Endometrium ?  ?Thickness: 10 mm.  No endometrial fluid or mass ?  ?Right ovary ?   ?Not visualized, likely obscured by bowel ?  ?Left ovary ?  ?Not visualized, likely obscured by bowel ?  ?Other findings ?  ?No free pelvic fluid.  No adnexal masses. ?  ?IMPRESSION: ?Heterogeneous uterus with question 4.2 cm diameter submucosal ?leiomyoma at posterior mid upper uterus. ?  ?Remainder of exam unremarkable. ?  ?  ?Electronically Signed ?  By: Lavonia Dana M.D. ?  On: 06/22/2021 15:48 ?  ? ?07/2021 ?ENDOMETRIUM, BIOPSY:  ?- Secretory endometrium.  ?- No hyperplasia or carcinoma. ? ? ?Assessment/Plan: ?51 yo with AUB likely perimenopausal here for endometrial ablation ?- Risks, benefits and alternatives were explained including but not limited to risks of bleeding, infection, uterine perforation and damage to adjacent organs. Patient verbalized understanding and all questions were answered ? ?Jacqueline Medina ?12/25/2021, 7:28 AM ? ?

## 2021-12-25 ENCOUNTER — Ambulatory Visit (HOSPITAL_BASED_OUTPATIENT_CLINIC_OR_DEPARTMENT_OTHER): Payer: BC Managed Care – PPO | Admitting: Anesthesiology

## 2021-12-25 ENCOUNTER — Other Ambulatory Visit: Payer: Self-pay

## 2021-12-25 ENCOUNTER — Encounter (HOSPITAL_BASED_OUTPATIENT_CLINIC_OR_DEPARTMENT_OTHER): Payer: Self-pay | Admitting: Obstetrics and Gynecology

## 2021-12-25 ENCOUNTER — Encounter (HOSPITAL_BASED_OUTPATIENT_CLINIC_OR_DEPARTMENT_OTHER)
Admission: RE | Disposition: A | Discharge status: Home / Self Care | Payer: Self-pay | Source: Home / Self Care | Attending: Obstetrics and Gynecology

## 2021-12-25 ENCOUNTER — Ambulatory Visit (HOSPITAL_BASED_OUTPATIENT_CLINIC_OR_DEPARTMENT_OTHER)
Admission: RE | Admit: 2021-12-25 | Discharge: 2021-12-25 | Disposition: A | Discharge status: Home / Self Care | Payer: BC Managed Care – PPO | Attending: Obstetrics and Gynecology | Admitting: Obstetrics and Gynecology

## 2021-12-25 DIAGNOSIS — K219 Gastro-esophageal reflux disease without esophagitis: Secondary | ICD-10-CM | POA: Insufficient documentation

## 2021-12-25 DIAGNOSIS — Z8673 Personal history of transient ischemic attack (TIA), and cerebral infarction without residual deficits: Secondary | ICD-10-CM | POA: Insufficient documentation

## 2021-12-25 DIAGNOSIS — I10 Essential (primary) hypertension: Secondary | ICD-10-CM | POA: Insufficient documentation

## 2021-12-25 DIAGNOSIS — F419 Anxiety disorder, unspecified: Secondary | ICD-10-CM | POA: Insufficient documentation

## 2021-12-25 DIAGNOSIS — Z7902 Long term (current) use of antithrombotics/antiplatelets: Secondary | ICD-10-CM | POA: Diagnosis not present

## 2021-12-25 DIAGNOSIS — N939 Abnormal uterine and vaginal bleeding, unspecified: Secondary | ICD-10-CM | POA: Insufficient documentation

## 2021-12-25 HISTORY — DX: Abnormal uterine and vaginal bleeding, unspecified: N93.9

## 2021-12-25 HISTORY — DX: Moyamoya disease: I67.5

## 2021-12-25 HISTORY — DX: Anxiety disorder, unspecified: F41.9

## 2021-12-25 HISTORY — PX: DILITATION & CURRETTAGE/HYSTROSCOPY WITH HYDROTHERMAL ABLATION: SHX5570

## 2021-12-25 HISTORY — DX: Gastro-esophageal reflux disease without esophagitis: K21.9

## 2021-12-25 HISTORY — PX: HYSTEROSCOPY WITH NOVASURE: SHX5574

## 2021-12-25 HISTORY — DX: Other specified postprocedural states: Z98.890

## 2021-12-25 HISTORY — DX: Hyperlipidemia, unspecified: E78.5

## 2021-12-25 LAB — POCT PREGNANCY, URINE: Preg Test, Ur: NEGATIVE

## 2021-12-25 SURGERY — DILATATION & CURETTAGE/HYSTEROSCOPY WITH HYDROTHERMAL ABLATION
Anesthesia: General | Site: Uterus

## 2021-12-25 MED ORDER — FENTANYL CITRATE (PF) 100 MCG/2ML IJ SOLN
INTRAMUSCULAR | Status: AC
Start: 2021-12-25 — End: ?
  Filled 2021-12-25: qty 2

## 2021-12-25 MED ORDER — LIDOCAINE 2% (20 MG/ML) 5 ML SYRINGE
INTRAMUSCULAR | Status: DC | PRN
  Administered 2021-12-25: 60 mg via INTRAVENOUS

## 2021-12-25 MED ORDER — ONDANSETRON HCL 4 MG/2ML IJ SOLN
INTRAMUSCULAR | Status: AC
  Filled 2021-12-25: qty 2

## 2021-12-25 MED ORDER — PROPOFOL 10 MG/ML IV BOLUS
INTRAVENOUS | Status: DC | PRN
  Administered 2021-12-25: 150 mg via INTRAVENOUS

## 2021-12-25 MED ORDER — DEXAMETHASONE SODIUM PHOSPHATE 4 MG/ML IJ SOLN
INTRAMUSCULAR | Status: DC | PRN
  Administered 2021-12-25: 5 mg via INTRAVENOUS

## 2021-12-25 MED ORDER — POVIDONE-IODINE 10 % EX SWAB: 2.0000 "application " | Freq: Once | CUTANEOUS | Status: DC

## 2021-12-25 MED ORDER — OXYCODONE-ACETAMINOPHEN 5-325 MG PO TABS: 1.0000 | ORAL_TABLET | Freq: Four times a day (QID) | ORAL | 0 refills | Status: DC | PRN

## 2021-12-25 MED ORDER — SCOPOLAMINE 1 MG/3DAYS TD PT72
MEDICATED_PATCH | TRANSDERMAL | Status: AC
  Filled 2021-12-25: qty 1

## 2021-12-25 MED ORDER — EPHEDRINE SULFATE (PRESSORS) 50 MG/ML IJ SOLN
INTRAMUSCULAR | Status: DC | PRN
  Administered 2021-12-25 (×2): 10 mg via INTRAVENOUS

## 2021-12-25 MED ORDER — FENTANYL CITRATE (PF) 100 MCG/2ML IJ SOLN
INTRAMUSCULAR | Status: DC | PRN
Start: 2021-12-25 — End: 2021-12-25
  Administered 2021-12-25: 50 ug via INTRAVENOUS

## 2021-12-25 MED ORDER — MIDAZOLAM HCL 5 MG/5ML IJ SOLN
INTRAMUSCULAR | Status: DC | PRN
  Administered 2021-12-25: 2 mg via INTRAVENOUS

## 2021-12-25 MED ORDER — ONDANSETRON HCL 4 MG/2ML IJ SOLN
INTRAMUSCULAR | Status: DC | PRN
  Administered 2021-12-25: 4 mg via INTRAVENOUS

## 2021-12-25 MED ORDER — ACETAMINOPHEN 500 MG PO TABS
ORAL_TABLET | ORAL | Status: AC
  Filled 2021-12-25: qty 2

## 2021-12-25 MED ORDER — FENTANYL CITRATE (PF) 100 MCG/2ML IJ SOLN: 25.0000 ug | INTRAMUSCULAR | Status: DC | PRN

## 2021-12-25 MED ORDER — DEXAMETHASONE SODIUM PHOSPHATE 10 MG/ML IJ SOLN
INTRAMUSCULAR | Status: AC
  Filled 2021-12-25: qty 1

## 2021-12-25 MED ORDER — LACTATED RINGERS IV SOLN: INTRAVENOUS | Status: DC

## 2021-12-25 MED ORDER — ACETAMINOPHEN 500 MG PO TABS
1000.0000 mg | ORAL_TABLET | Freq: Once | ORAL | Status: AC
  Administered 2021-12-25: 1000 mg via ORAL

## 2021-12-25 MED ORDER — MIDAZOLAM HCL 2 MG/2ML IJ SOLN
INTRAMUSCULAR | Status: AC
  Filled 2021-12-25: qty 2

## 2021-12-25 MED ORDER — SCOPOLAMINE 1 MG/3DAYS TD PT72
1.0000 | MEDICATED_PATCH | TRANSDERMAL | Status: DC
  Administered 2021-12-25: 1.5 mg via TRANSDERMAL

## 2021-12-25 MED ORDER — SODIUM CHLORIDE 0.9 % IR SOLN
Status: DC | PRN
  Administered 2021-12-25: 3000 mL

## 2021-12-25 MED ORDER — IBUPROFEN 600 MG PO TABS: 600.0000 mg | ORAL_TABLET | Freq: Four times a day (QID) | ORAL | 3 refills | Status: DC | PRN

## 2021-12-25 MED ORDER — CHLOROPROCAINE HCL 1 % IJ SOLN
INTRAMUSCULAR | Status: DC | PRN
  Administered 2021-12-25: 10 mL

## 2021-12-25 SURGICAL SUPPLY — 16 items
ABLATOR SURESOUND NOVASURE (ABLATOR) ×1 IMPLANT
CATH ROBINSON RED A/P 16FR (CATHETERS) ×1 IMPLANT
CONTAINER PREFILL 10% NBF 60ML (FORM) ×2 IMPLANT
GAUZE XEROFORM 5X9 LF (GAUZE/BANDAGES/DRESSINGS) ×1 IMPLANT
GLOVE BIOGEL PI IND STRL 7.0 (GLOVE) ×1 IMPLANT
GLOVE BIOGEL PI INDICATOR 7.0 (GLOVE) ×1
GLOVE SURG POLYISO LF SZ6.5 (GLOVE) ×2 IMPLANT
GLOVE SURG UNDER POLY LF SZ6.5 (GLOVE) ×2 IMPLANT
GOWN STRL REUS W/ TWL LRG LVL3 (GOWN DISPOSABLE) ×2 IMPLANT
GOWN STRL REUS W/TWL LRG LVL3 (GOWN DISPOSABLE) ×3
PACK VAGINAL MINOR WOMEN LF (CUSTOM PROCEDURE TRAY) ×2 IMPLANT
PAD OB MATERNITY 4.3X12.25 (PERSONAL CARE ITEMS) ×2 IMPLANT
PAD PREP 24X48 CUFFED NSTRL (MISCELLANEOUS) ×2 IMPLANT
SET GENESYS HTA PROCERVA (MISCELLANEOUS) ×2 IMPLANT
SLEEVE SCD COMPRESS KNEE MED (STOCKING) ×2 IMPLANT
TOWEL GREEN STERILE FF (TOWEL DISPOSABLE) ×4 IMPLANT

## 2021-12-25 NOTE — Telephone Encounter (Signed)
I attempted to reach the patient again. Per her message, the procedure is scheduled today. Left second message to call us, if she still needs any guidance.  ?

## 2021-12-25 NOTE — Transfer of Care (Signed)
Immediate Anesthesia Transfer of Care Note ? ?Patient: Jacqueline Medina ? ?Procedure(s) Performed: ATTEMPTED DILATATION & CURETTAGE/HYSTEROSCOPY WITH HYDROTHERMAL ABLATION (Uterus) ?ATTEMPTED HYSTEROSCOPY WITH NOVASURE (Uterus) ? ?Patient Location: PACU ? ?Anesthesia Type:General ? ?Level of Consciousness: sedated ? ?Airway & Oxygen Therapy: Patient Spontanous Breathing and Patient connected to face mask oxygen ? ?Post-op Assessment: Report given to RN and Post -op Vital signs reviewed and stable ? ?Post vital signs: Reviewed and stable ? ?Last Vitals:  ?Vitals Value Taken Time  ?BP 131/71 12/25/21 0906  ?Temp 36.7 ?C 12/25/21 0906  ?Pulse 100 12/25/21 0910  ?Resp 14 12/25/21 0910  ?SpO2 100 % 12/25/21 0910  ?Vitals shown include unvalidated device data. ? ?Last Pain:  ?Vitals:  ? 12/25/21 0647  ?TempSrc: Oral  ?PainSc: 0-No pain  ?   ? ?Patients Stated Pain Goal: 4 (12/25/21 5364) ? ?Complications: No notable events documented. ?

## 2021-12-25 NOTE — Anesthesia Preprocedure Evaluation (Addendum)
Anesthesia Evaluation  ?Patient identified by MRN, date of birth, ID band ?Patient awake ? ? ? ?Reviewed: ?Allergy & Precautions, NPO status , Patient's Chart, lab work & pertinent test results ? ?History of Anesthesia Complications ?(+) PONV and history of anesthetic complications ? ?Airway ?Mallampati: I ? ?TM Distance: >3 FB ?Neck ROM: Full ? ? ? Dental ?no notable dental hx. ?(+) Teeth Intact, Dental Advisory Given ?  ?Pulmonary ?neg pulmonary ROS,  ?  ?Pulmonary exam normal ?breath sounds clear to auscultation ? ? ? ? ? ? Cardiovascular ?hypertension, Normal cardiovascular exam ?Rhythm:Regular Rate:Normal ? ? ?  ?Neuro/Psych ?PSYCHIATRIC DISORDERS Anxiety CVA   ? GI/Hepatic ?Neg liver ROS, GERD  ,  ?Endo/Other  ?negative endocrine ROS ? Renal/GU ?negative Renal ROS  ?negative genitourinary ?  ?Musculoskeletal ?negative musculoskeletal ROS ?(+)  ? Abdominal ?  ?Peds ? Hematology ? ?(+) Blood dyscrasia (on plavix), anemia ,   ?Anesthesia Other Findings ? ? Reproductive/Obstetrics ? ?  ? ? ? ? ? ? ? ? ? ? ? ? ? ?  ?  ? ? ? ? ? ? ? ?Anesthesia Physical ?Anesthesia Plan ? ?ASA: 3 ? ?Anesthesia Plan: General  ? ?Post-op Pain Management: Toradol IV (intra-op)* and Ofirmev IV (intra-op)*  ? ?Induction: Intravenous ? ?PONV Risk Score and Plan: 4 or greater and Ondansetron, Dexamethasone, Midazolam and Scopolamine patch - Pre-op ? ?Airway Management Planned: LMA ? ?Additional Equipment:  ? ?Intra-op Plan:  ? ?Post-operative Plan: Extubation in OR ? ?Informed Consent: I have reviewed the patients History and Physical, chart, labs and discussed the procedure including the risks, benefits and alternatives for the proposed anesthesia with the patient or authorized representative who has indicated his/her understanding and acceptance.  ? ? ? ?Dental advisory given ? ?Plan Discussed with: CRNA ? ?Anesthesia Plan Comments:   ? ? ? ? ? ?Anesthesia Quick Evaluation ? ?

## 2021-12-25 NOTE — Op Note (Signed)
PREOPERATIVE DIAGNOSIS:  Abnormal uterine bleeding ?POSTOPERATIVE DIAGNOSIS: The same ?PROCEDURE: Hysteroscopy, Dilation and Curettage, attempted Hydrothermal Endometrial Ablation ?SURGEON:  Dr. Mora Bellman ? ?INDICATIONS: 51 y.o. G2P2000 here for scheduled surgery for abnormal uterine bleeding. Risks of surgery were discussed with the patient including but not limited to: bleeding; infection which may require antibiotics; injury to uterus leading to risk of injury to surrounding intraperitoneal organs, burn injury to vagina or other organs, need for additional procedures including laparoscopy or laparotomy, inability to complete ablation due to uterine or mechanical anomaly, and other postoperative/anesthesia complications.  Patient was informed that there is a high likelihood of success of controlling her symptoms; however about 5% of patients may require further intervention.  Written informed consent was obtained.   ? ?FINDINGS:  A 12 week size uterus.  Diffuse proliferative endometrium.  Normal ostia bilaterally. ? ?ANESTHESIA:   General, paracervical block. ?INTRAVENOUS FLUIDS:  600 ml of LR ?ESTIMATED BLOOD LOSS:  25 ml ?SPECIMENS: None ?COMPLICATIONS:  None immediate. ? ?PROCEDURE DETAILS:  The patient was then taken to the operating room where general anesthesia was administered and was found to be adequate.  After an adequate timeout was performed, she was placed in the dorsal lithotomy position and examined; then prepped and draped in the sterile manner.   Her bladder was catheterized for clear, yellow urine. A speculum was then placed in the patient's vagina and a single tooth tenaculum was applied to the anterior lip of the cervix.   A paracervical block using 30 ml of 0.5% Marcaine was administered.  The cervix was sounded to 4 cm and dilated manually with metal dilators to accommodate the hydrothermal ablation hysteroscopic apparatus.  Once the cervix was dilated, the hysteroscope was inserted  under direct visualization using normal saline as a suspension medium.  The uterine cavity was carefully examined, both ostia were recognized, and diffusely proliferative endometrium was noted.   The hydrothermal ablation was then carried out as per protocol.   Cavity test failed despite trouble shooting due to over dilated cervix. Decision was made to convert to Novasure ablation using standard technique and Novasure sales representative present in the room. Cavity test failed again due to over dilated cervix. Ablation was aborted. No complications were observed.  The tenaculum was removed from the anterior lip of the cervix, and the vaginal speculum was removed after noting good hemostasis.  The patient tolerated the procedure well and was taken to the recovery area awake, extubated and in stable condition. ? ?The patient will be discharged to home as per PACU criteria.  Routine postoperative instructions given.  She was prescribed Percocet, Ibuprofen and Colace. ? ?

## 2021-12-25 NOTE — Discharge Instructions (Signed)
No tylenol until 1:30pm ? ? ? ?Post Anesthesia Home Care Instructions ? ?Activity: ?Get plenty of rest for the remainder of the day. A responsible individual must stay with you for 24 hours following the procedure.  ?For the next 24 hours, DO NOT: ?-Drive a car ?-Paediatric nurse ?-Drink alcoholic beverages ?-Take any medication unless instructed by your physician ?-Make any legal decisions or sign important papers. ? ?Meals: ?Start with liquid foods such as gelatin or soup. Progress to regular foods as tolerated. Avoid greasy, spicy, heavy foods. If nausea and/or vomiting occur, drink only clear liquids until the nausea and/or vomiting subsides. Call your physician if vomiting continues. ? ?Special Instructions/Symptoms: ?Your throat may feel dry or sore from the anesthesia or the breathing tube placed in your throat during surgery. If this causes discomfort, gargle with warm salt water. The discomfort should disappear within 24 hours. ? ?If you had a scopolamine patch placed behind your ear for the management of post- operative nausea and/or vomiting: ? ?1. The medication in the patch is effective for 72 hours, after which it should be removed.  Wrap patch in a tissue and discard in the trash. Wash hands thoroughly with soap and water. ?2. You may remove the patch earlier than 72 hours if you experience unpleasant side effects which may include dry mouth, dizziness or visual disturbances. ?3. Avoid touching the patch. Wash your hands with soap and water after contact with the patch. ?    ?

## 2021-12-25 NOTE — Anesthesia Postprocedure Evaluation (Signed)
Anesthesia Post Note ? ?Patient: Jacqueline Medina ? ?Procedure(s) Performed: ATTEMPTED DILATATION & CURETTAGE/HYSTEROSCOPY WITH HYDROTHERMAL ABLATION (Uterus) ?ATTEMPTED HYSTEROSCOPY WITH NOVASURE (Uterus) ? ?  ? ?Patient location during evaluation: PACU ?Anesthesia Type: General ?Level of consciousness: awake and alert ?Pain management: pain level controlled ?Vital Signs Assessment: post-procedure vital signs reviewed and stable ?Respiratory status: spontaneous breathing, nonlabored ventilation, respiratory function stable and patient connected to nasal cannula oxygen ?Cardiovascular status: blood pressure returned to baseline and stable ?Postop Assessment: no apparent nausea or vomiting ?Anesthetic complications: no ? ? ?No notable events documented. ? ?Last Vitals:  ?Vitals:  ? 12/25/21 0930 12/25/21 0944  ?BP: 140/85 (!) 148/82  ?Pulse: 95 96  ?Resp: 14 16  ?Temp:  36.4 ?C  ?SpO2: 98% 99%  ?  ?Last Pain:  ?Vitals:  ? 12/25/21 0944  ?TempSrc:   ?PainSc: 0-No pain  ? ? ?  ?  ?  ?  ?  ?  ? ?Raziel Koenigs L Fong Mccarry ? ? ? ? ?

## 2021-12-25 NOTE — Anesthesia Procedure Notes (Signed)
Procedure Name: LMA Insertion ?Date/Time: 12/25/2021 7:51 AM ?Performed by: Maryella Shivers, CRNA ?Pre-anesthesia Checklist: Patient identified, Emergency Drugs available, Suction available and Patient being monitored ?Patient Re-evaluated:Patient Re-evaluated prior to induction ?Oxygen Delivery Method: Circle system utilized ?Preoxygenation: Pre-oxygenation with 100% oxygen ?Induction Type: IV induction ?Ventilation: Mask ventilation without difficulty ?LMA: LMA inserted ?LMA Size: 4.0 ?Number of attempts: 1 ?Airway Equipment and Method: Bite block ?Placement Confirmation: positive ETCO2 ?Tube secured with: Tape ?Dental Injury: Teeth and Oropharynx as per pre-operative assessment  ? ? ? ? ?

## 2021-12-26 ENCOUNTER — Encounter (HOSPITAL_BASED_OUTPATIENT_CLINIC_OR_DEPARTMENT_OTHER): Payer: Self-pay | Admitting: Obstetrics and Gynecology

## 2021-12-27 ENCOUNTER — Ambulatory Visit: Payer: BC Managed Care – PPO | Admitting: Neurology

## 2022-01-09 ENCOUNTER — Telehealth: Payer: BC Managed Care – PPO | Admitting: Neurology

## 2022-01-09 DIAGNOSIS — I675 Moyamoya disease: Secondary | ICD-10-CM

## 2022-01-09 DIAGNOSIS — Z8673 Personal history of transient ischemic attack (TIA), and cerebral infarction without residual deficits: Secondary | ICD-10-CM | POA: Diagnosis not present

## 2022-01-09 NOTE — Progress Notes (Addendum)
I connected with  Jacqueline Medina on 01/09/22 by a video enabled telemedicine application and verified that I am speaking with the correct person using two identifiers. Patuient Location : home Physician Location : [facility] Office [address] [place]. [place] [address]   I discussed the limitations of evaluation and management by telemedicine. The patient expressed understanding and agreed to proceed.   Video telehealth visit however due to technical difficulties that the patient and I could not hear her and had to speak to her via telephone though I could see her.  I explained the technical difficulties with the patient and she was okay to proceed  Patient is seen today after last visit on 12/14/2020.  She states she continues to do well.  She has had no recurrent TIA or stroke symptoms.  She is tolerating Plavix well without bruising or bleeding.  She remains on Lipitor she is tolerating well without muscle aches and pains.  Her blood pressure is is now well controlled her primary care physician had to add lisinopril to her Lopressor and Norvasc.  She states she is active.  She has had no new interval health problems.  She had lab work on 09/10/2021 which showed satisfactory LDL cholesterol of 56 mg percent and hemoglobin A1c of 4.8. She had no new complaints today.  Physical exam was limited due to constraints of televideo visit Neurological exam is limited but she is awake alert oriented with normal speech and language.  Cranial nerve exam was unremarkable motor system exam symmetric strength all 4 extremities no focal weakness.  Sensation and coordination appeared intact.  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  She lacks skin lesions, acroparesthesias and others clinical symptoms of Fabry's.  Fabry's typically affects small vessels and not large vessels in the brain also.  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 Estanislado Pandy.  She remained stable from neurological standpoint and follow-up CT angiogram on 06/01/2020 shows no major progression.  Plan : Continue Plavix for stroke prevention and maintain aggressive risk factor modification withstrict control of hypertension with blood pressure goal below 130/90, diabetes with hemoglobin A1c goal below 6.5% and lipids with LDL cholesterol goal below 70 mg/dL. I also advised the patient to eat a healthy diet with plenty of whole grains, cereals, fruits and vegetables, exercise regularly and maintain ideal body weight .I advised her to keep her self well-hydrated and to avoid hypotension.  Followup in the future with me in 1 year or call earlier if necessary.  Antony Contras MD

## 2022-01-09 NOTE — Progress Notes (Deleted)
tele

## 2022-01-15 ENCOUNTER — Encounter: Payer: BC Managed Care – PPO | Admitting: Obstetrics and Gynecology

## 2022-01-19 ENCOUNTER — Other Ambulatory Visit: Payer: Self-pay | Admitting: Family Medicine

## 2022-01-19 ENCOUNTER — Other Ambulatory Visit: Payer: Self-pay | Admitting: Neurology

## 2022-01-19 DIAGNOSIS — I10 Essential (primary) hypertension: Secondary | ICD-10-CM

## 2022-02-06 ENCOUNTER — Other Ambulatory Visit: Payer: Self-pay | Admitting: Obstetrics and Gynecology

## 2022-02-12 ENCOUNTER — Encounter (HOSPITAL_COMMUNITY)
Admission: RE | Admit: 2022-02-12 | Discharge: 2022-02-12 | Disposition: A | Discharge status: Home / Self Care | Payer: BC Managed Care – PPO | Source: Ambulatory Visit | Attending: Obstetrics and Gynecology | Admitting: Obstetrics and Gynecology

## 2022-02-12 DIAGNOSIS — Z01812 Encounter for preprocedural laboratory examination: Secondary | ICD-10-CM | POA: Insufficient documentation

## 2022-02-12 DIAGNOSIS — K219 Gastro-esophageal reflux disease without esophagitis: Secondary | ICD-10-CM | POA: Diagnosis not present

## 2022-02-12 DIAGNOSIS — Z8673 Personal history of transient ischemic attack (TIA), and cerebral infarction without residual deficits: Secondary | ICD-10-CM | POA: Diagnosis not present

## 2022-02-12 DIAGNOSIS — I10 Essential (primary) hypertension: Secondary | ICD-10-CM | POA: Diagnosis not present

## 2022-02-12 DIAGNOSIS — N939 Abnormal uterine and vaginal bleeding, unspecified: Secondary | ICD-10-CM | POA: Diagnosis present

## 2022-02-12 LAB — CBC
HCT: 46 % (ref 36.0–46.0)
Hemoglobin: 15.3 g/dL — ABNORMAL HIGH (ref 12.0–15.0)
MCH: 31.1 pg (ref 26.0–34.0)
MCHC: 33.3 g/dL (ref 30.0–36.0)
MCV: 93.5 fL (ref 80.0–100.0)
Platelets: 212 10*3/uL (ref 150–400)
RBC: 4.92 MIL/uL (ref 3.87–5.11)
RDW: 14.2 % (ref 11.5–15.5)
WBC: 8.9 10*3/uL (ref 4.0–10.5)
nRBC: 0 % (ref 0.0–0.2)

## 2022-02-12 LAB — BASIC METABOLIC PANEL
Anion gap: 8 (ref 5–15)
BUN: 19 mg/dL (ref 6–20)
CO2: 23 mmol/L (ref 22–32)
Calcium: 9.1 mg/dL (ref 8.9–10.3)
Chloride: 109 mmol/L (ref 98–111)
Creatinine, Ser: 1.04 mg/dL — ABNORMAL HIGH (ref 0.44–1.00)
GFR, Estimated: >60 mL/min (ref >60)
Glucose, Bld: 92 mg/dL (ref 70–99)
Potassium: 3.6 mmol/L (ref 3.5–5.1)
Sodium: 140 mmol/L (ref 135–145)

## 2022-02-13 ENCOUNTER — Encounter (HOSPITAL_BASED_OUTPATIENT_CLINIC_OR_DEPARTMENT_OTHER): Payer: Self-pay | Admitting: Obstetrics and Gynecology

## 2022-02-13 NOTE — Progress Notes (Signed)
Called and spoke w/ Jacqueline Medina, [place] scheduler for Dr Lanny Cramp, inquiring about pt's plavix.  Per Dr Lanny Cramp in office note pt to continue plavix prior to surgery since surgery is low risk.

## 2022-02-14 NOTE — Progress Notes (Signed)
Pt left message yesterday after 7:30pm, that she will being working today 12 hours and will be able to do pre-op interview via phone today.  Called pt back and left message with instructions to arrive at 1115 on 02-15-2022 npo after midnight tonight with exception water, gatorade, tea, black coffee sweetened but no cream/ milk products until 1015 the absolutely nothing by mouth.  If she takes in AM , Do not take lisinopril and plavix morning of surgery. But may take buspar morning of surgery. Asked to bring insurance card and phone ID, must have name / phone number of responsible driver and caregiver at check in. Asked if she is about to take a break at some point today give [place] a call back so med list and medical history can be completed.

## 2022-02-15 ENCOUNTER — Encounter (HOSPITAL_BASED_OUTPATIENT_CLINIC_OR_DEPARTMENT_OTHER): Payer: Self-pay | Admitting: Obstetrics and Gynecology

## 2022-02-15 ENCOUNTER — Ambulatory Visit (HOSPITAL_BASED_OUTPATIENT_CLINIC_OR_DEPARTMENT_OTHER): Payer: BC Managed Care – PPO | Admitting: Anesthesiology

## 2022-02-15 ENCOUNTER — Encounter (HOSPITAL_BASED_OUTPATIENT_CLINIC_OR_DEPARTMENT_OTHER)
Admission: RE | Disposition: A | Discharge status: Home / Self Care | Payer: Self-pay | Source: Home / Self Care | Attending: Obstetrics and Gynecology

## 2022-02-15 ENCOUNTER — Other Ambulatory Visit: Payer: Self-pay

## 2022-02-15 ENCOUNTER — Ambulatory Visit (HOSPITAL_BASED_OUTPATIENT_CLINIC_OR_DEPARTMENT_OTHER)
Admission: RE | Admit: 2022-02-15 | Discharge: 2022-02-15 | Disposition: A | Discharge status: Home / Self Care | Payer: BC Managed Care – PPO | Attending: Obstetrics and Gynecology | Admitting: Obstetrics and Gynecology

## 2022-02-15 DIAGNOSIS — K219 Gastro-esophageal reflux disease without esophagitis: Secondary | ICD-10-CM | POA: Insufficient documentation

## 2022-02-15 DIAGNOSIS — N939 Abnormal uterine and vaginal bleeding, unspecified: Secondary | ICD-10-CM | POA: Insufficient documentation

## 2022-02-15 DIAGNOSIS — Z8673 Personal history of transient ischemic attack (TIA), and cerebral infarction without residual deficits: Secondary | ICD-10-CM | POA: Insufficient documentation

## 2022-02-15 DIAGNOSIS — I10 Essential (primary) hypertension: Secondary | ICD-10-CM | POA: Insufficient documentation

## 2022-02-15 HISTORY — PX: DILITATION & CURRETTAGE/HYSTROSCOPY WITH NOVASURE ABLATION: SHX5568

## 2022-02-15 HISTORY — DX: Generalized anxiety disorder: F41.1

## 2022-02-15 HISTORY — DX: Iron deficiency anemia secondary to blood loss (chronic): D50.0

## 2022-02-15 HISTORY — DX: Long term (current) use of anticoagulants: Z79.01

## 2022-02-15 HISTORY — DX: Occlusion and stenosis of bilateral carotid arteries: I65.23

## 2022-02-15 LAB — TYPE AND SCREEN
ABO/RH(D): O POS
Antibody Screen: NEGATIVE

## 2022-02-15 LAB — POCT PREGNANCY, URINE: Preg Test, Ur: NEGATIVE

## 2022-02-15 SURGERY — DILATATION & CURETTAGE/HYSTEROSCOPY WITH NOVASURE ABLATION
Anesthesia: General

## 2022-02-15 MED ORDER — ONDANSETRON HCL 4 MG/2ML IJ SOLN
INTRAMUSCULAR | Status: AC
  Filled 2022-02-15: qty 2

## 2022-02-15 MED ORDER — PROPOFOL 10 MG/ML IV BOLUS
INTRAVENOUS | Status: AC
  Filled 2022-02-15: qty 20

## 2022-02-15 MED ORDER — SCOPOLAMINE 1 MG/3DAYS TD PT72
1.0000 | MEDICATED_PATCH | TRANSDERMAL | Status: DC
  Administered 2022-02-15: 1.5 mg via TRANSDERMAL

## 2022-02-15 MED ORDER — KETOROLAC TROMETHAMINE 30 MG/ML IJ SOLN
INTRAMUSCULAR | Status: AC
  Filled 2022-02-15: qty 1

## 2022-02-15 MED ORDER — ACETAMINOPHEN 500 MG PO TABS
ORAL_TABLET | ORAL | Status: AC
  Filled 2022-02-15: qty 2

## 2022-02-15 MED ORDER — PROPOFOL 10 MG/ML IV BOLUS
INTRAVENOUS | Status: DC | PRN
  Administered 2022-02-15: 150 mg via INTRAVENOUS

## 2022-02-15 MED ORDER — MIDAZOLAM HCL 2 MG/2ML IJ SOLN
INTRAMUSCULAR | Status: AC
  Filled 2022-02-15: qty 2

## 2022-02-15 MED ORDER — SODIUM CHLORIDE 0.9 % IR SOLN
Status: DC | PRN
Start: 2022-02-15 — End: 2022-02-15
  Administered 2022-02-15: 500 mL
  Administered 2022-02-15: 3000 mL

## 2022-02-15 MED ORDER — LIDOCAINE HCL 1 % IJ SOLN
INTRAMUSCULAR | Status: DC | PRN
  Administered 2022-02-15: 6 mL

## 2022-02-15 MED ORDER — DEXAMETHASONE SODIUM PHOSPHATE 10 MG/ML IJ SOLN
INTRAMUSCULAR | Status: DC | PRN
  Administered 2022-02-15 (×2): 5 mg via INTRAVENOUS

## 2022-02-15 MED ORDER — AMISULPRIDE (ANTIEMETIC) 5 MG/2ML IV SOLN: 10.0000 mg | Freq: Once | INTRAVENOUS | Status: DC | PRN

## 2022-02-15 MED ORDER — SCOPOLAMINE 1 MG/3DAYS TD PT72
MEDICATED_PATCH | TRANSDERMAL | Status: AC
  Filled 2022-02-15: qty 1

## 2022-02-15 MED ORDER — ACETAMINOPHEN 500 MG PO TABS
1000.0000 mg | ORAL_TABLET | Freq: Once | ORAL | Status: AC
  Administered 2022-02-15: 1000 mg via ORAL

## 2022-02-15 MED ORDER — LIDOCAINE HCL (PF) 2 % IJ SOLN
INTRAMUSCULAR | Status: AC
  Filled 2022-02-15: qty 5

## 2022-02-15 MED ORDER — LIDOCAINE 2% (20 MG/ML) 5 ML SYRINGE
INTRAMUSCULAR | Status: DC | PRN
  Administered 2022-02-15: 60 mg via INTRAVENOUS

## 2022-02-15 MED ORDER — MIDAZOLAM HCL 2 MG/2ML IJ SOLN
INTRAMUSCULAR | Status: DC | PRN
  Administered 2022-02-15: 2 mg via INTRAVENOUS

## 2022-02-15 MED ORDER — LACTATED RINGERS IV SOLN: INTRAVENOUS | Status: DC

## 2022-02-15 MED ORDER — ONDANSETRON HCL 4 MG/2ML IJ SOLN
INTRAMUSCULAR | Status: DC | PRN
  Administered 2022-02-15: 4 mg via INTRAVENOUS

## 2022-02-15 MED ORDER — IBUPROFEN 800 MG PO TABS: 800.0000 mg | ORAL_TABLET | Freq: Three times a day (TID) | ORAL | 0 refills | Status: DC | PRN

## 2022-02-15 MED ORDER — DEXAMETHASONE SODIUM PHOSPHATE 10 MG/ML IJ SOLN
INTRAMUSCULAR | Status: AC
  Filled 2022-02-15: qty 1

## 2022-02-15 MED ORDER — FENTANYL CITRATE (PF) 100 MCG/2ML IJ SOLN
INTRAMUSCULAR | Status: DC | PRN
  Administered 2022-02-15: 50 ug via INTRAVENOUS

## 2022-02-15 MED ORDER — FENTANYL CITRATE (PF) 100 MCG/2ML IJ SOLN
INTRAMUSCULAR | Status: AC
  Filled 2022-02-15: qty 2

## 2022-02-15 MED ORDER — POVIDONE-IODINE 10 % EX SWAB: 2.0000 "application " | Freq: Once | CUTANEOUS | Status: DC

## 2022-02-15 MED ORDER — FENTANYL CITRATE (PF) 100 MCG/2ML IJ SOLN: 25.0000 ug | INTRAMUSCULAR | Status: DC | PRN

## 2022-02-15 SURGICAL SUPPLY — 16 items
ABLATOR SURESOUND NOVASURE (ABLATOR) ×2 IMPLANT
CATH ROBINSON RED A/P 16FR (CATHETERS) ×2 IMPLANT
DRSG TELFA 3X8 NADH (GAUZE/BANDAGES/DRESSINGS) ×2 IMPLANT
GAUZE 4X4 16PLY ~~LOC~~+RFID DBL (SPONGE) ×2 IMPLANT
GAUZE VASELINE 3X9 (GAUZE/BANDAGES/DRESSINGS) ×1 IMPLANT
GLOVE BIOGEL PI IND STRL 7.0 (GLOVE) ×2 IMPLANT
GLOVE BIOGEL PI INDICATOR 7.0 (GLOVE) ×2
GLOVE ECLIPSE 6.5 STRL STRAW (GLOVE) ×2 IMPLANT
GOWN STRL REUS W/TWL LRG LVL3 (GOWN DISPOSABLE) ×4 IMPLANT
KIT PROCEDURE FLUENT (KITS) ×2 IMPLANT
KIT TURNOVER CYSTO (KITS) ×2 IMPLANT
PACK VAGINAL MINOR WOMEN LF (CUSTOM PROCEDURE TRAY) ×2 IMPLANT
PAD DRESSING TELFA 3X8 NADH (GAUZE/BANDAGES/DRESSINGS) ×1 IMPLANT
PAD OB MATERNITY 4.3X12.25 (PERSONAL CARE ITEMS) ×2 IMPLANT
TOWEL OR 17X26 10 PK STRL BLUE (TOWEL DISPOSABLE) ×4 IMPLANT
UNDERPAD 30X36 HEAVY ABSORB (UNDERPADS AND DIAPERS) ×2 IMPLANT

## 2022-02-15 NOTE — Discharge Instructions (Addendum)
  Post Anesthesia Home Care Instructions  Activity: Get plenty of rest for the remainder of the day. A responsible individual must stay with you for 24 hours following the procedure.  For the next 24 hours, DO NOT: -Drive a car -Paediatric nurse -Drink alcoholic beverages -Take any medication unless instructed by your physician -Make any legal decisions or sign important papers.  Meals: Start with liquid foods such as gelatin or soup. Progress to regular foods as tolerated. Avoid greasy, spicy, heavy foods. If nausea and/or vomiting occur, drink only clear liquids until the nausea and/or vomiting subsides. Call your physician if vomiting continues.  Special Instructions/Symptoms: Your throat may feel dry or sore from the anesthesia or the breathing tube placed in your throat during surgery. If this causes discomfort, gargle with warm salt water. The discomfort should disappear within 24 hours.  If you had a scopolamine patch placed behind your ear for the management of post- operative nausea and/or vomiting:  1. The medication in the patch is effective for 72 hours, after which it should be removed.  Wrap patch in a tissue and discard in the trash. Wash hands thoroughly with soap and water. 2. You may remove the patch earlier than 72 hours if you experience unpleasant side effects which may include dry mouth, dizziness or visual disturbances. 3. Avoid touching the patch. Wash your hands with soap and water after contact with the patch. Remove patch by Monday, May 29th.    No acetaminophen/Tylenol until after 6 pm today if needed.   D & C Home care Instructions:   Personal hygiene:  Used sanitary napkins for vaginal drainage not tampons. Shower or tub bathe the day after your procedure. No douching until bleeding stops. Always wipe from front to back after  Elimination.  Activity: Do not drive or operate any equipment today. The effects of the anesthesia are still present and  drowsiness may result. Rest today, not necessarily flat bed rest, just take it easy. You may resume your normal activity in one to 2 days.  Sexual activity: No intercourse for one week or as indicated by your physician  Diet: Eat a light diet as desired this evening. You may resume a regular diet tomorrow.  Return to work: One to 2 days.  General Expectations of your surgery: Vaginal bleeding should be no heavier than a normal period. Spotting may continue up to 10 days. Mild cramps may continue for a couple of days. You may have a regular period in 2-6 weeks.  Unexpected observations call your doctor if these occur: persistent or heavy bleeding. Severe abdominal cramping or pain. Elevation of temperature greater than 100F.  Call for an appointment in one week, or as directed by your physician.

## 2022-02-15 NOTE — H&P (Signed)
Jacqueline Medina is an 51 y.o. female presenting for scheduled surgery with hysteroscopy, D&C, endometrial ablation for AUB.  Patient is a [id number] [id number] with long history of irregular and prolonged bleeding  Menorrhagia for the past year, initial work up and management done with the [facility] as follows:  06/22/21 TVUS: anteverted uterus 12 cm w/ suspected SM fibroid 4.2cm posteriorly, endometrium 10 mm thickness, and unable to visualize b/l ovaries d/t bowel pattern 07/24/21 critical Hgb 7.2 and received blood transfusion on ER admission at that time 12/25/21 Op note with Dr. Elly Modena reviewed, hysteroscopy findings note diffuse proliferative endometrium but no mention of SM fibroid; cavity test failed due to over dilated cervix with both attempts at hydrothermal ablation and Novasure   She declined trial of LNG-IUD and declines full hysterectomy 01/18/22 in office EBx done: BENIGN  PSHx BTL for contraception and c/s x1 PMHx significant for stroke/CVA, HTN, HLD managed on Clopidogrel, Lisinopril. Amlodipine, Metoprolol, and Atorvastatin as well as depression on Buspar   Menstrual History: Patient's last menstrual period was 02/15/2022.    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 w/ bilateral severe middle cerebral artery stenosis w/ a moyamoya like pattern, felt to be famlial   Hyperlipidemia    Hypertension    Iron deficiency anemia due to chronic blood loss    hx admission 07-24-2021 , transfused one unit blood and x2 Iron infusion   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   Occlusion and stenosis  of bilateral carotid arteries    per angiogram done 02-08-2020  occlusion left ICA terminus & right ICA 50-60% stenosis   PONV (postoperative nausea and vomiting)     Past Surgical History:  Procedure Laterality Date   CESAREAN SECTION  1992   DILITATION & CURRETTAGE/HYSTROSCOPY WITH HYDROTHERMAL ABLATION N/A 12/25/2021   Procedure: ATTEMPTED DILATATION & CURETTAGE/HYSTEROSCOPY WITH HYDROTHERMAL ABLATION;  Surgeon: Mora Bellman, MD;  Location: [place];  Service: Gynecology;  Laterality: N/A;   HYSTEROSCOPY WITH NOVASURE N/A 12/25/2021   Procedure: ATTEMPTED HYSTEROSCOPY WITH NOVASURE;  Surgeon: Mora Bellman, MD;  Location: [place];  Service: Gynecology;  Laterality: N/A;   IR ANGIO INTRA EXTRACRAN SEL COM CAROTID INNOMINATE BILAT MOD SED  02/08/2020   IR ANGIO VERTEBRAL SEL VERTEBRAL BILAT MOD SED  02/08/2020   IR US GUIDE VASC ACCESS RIGHT  02/08/2020    Family History  Problem Relation Age of Onset   Diabetes Father    Stroke Sister    Stroke Other     Social History:  reports that she has never smoked. She has never used smokeless tobacco. She reports that she does not currently use alcohol. She reports that she does not use drugs.  Allergies: No Known Allergies  Medications Prior to Admission  Medication Sig Dispense Refill Last Dose   amLODipine (NORVASC) 5 MG tablet TAKE ONE TABLET BY MOUTH EVERY NIGHT AT BEDTIME 90 tablet 2 02/14/2022   atorvastatin (LIPITOR) 80 MG tablet TAKE ONE TABLET BY MOUTH DAILY 90 tablet 3 02/14/2022   busPIRone (BUSPAR) 5 MG tablet TAKE  ONE TABLET BY MOUTH EVERY MORNING TAKE TWO TABLETS BY MOUTH EVERY EVENING 90 tablet 6 02/15/2022   clopidogrel (PLAVIX) 75 MG tablet TAKE ONE TABLET BY MOUTH DAILY 90 tablet 1 02/14/2022   lisinopril (ZESTRIL) 20 MG tablet TAKE ONE TABLET BY MOUTH DAILY 90 tablet 2 02/14/2022   megestrol (MEGACE) 40 MG tablet Take 1 tablet (40 mg total) by mouth 2 (two) times daily. Can increase to two  tablets twice a day in the event of heavy bleeding (Patient taking differently: Take 80 mg by mouth 2 (two) times daily. Can increase to two tablets twice a day in the event of heavy bleeding) 120 tablet 5 02/14/2022   Multiple Vitamins-Minerals (MULTIVITAMIN ADULTS) TABS Take 1 tablet by mouth daily.   02/14/2022   ibuprofen (ADVIL) 600 MG tablet Take 1 tablet (600 mg total) by mouth every 6 (six) hours as needed. 60 tablet 3    oxyCODONE-acetaminophen (PERCOCET/ROXICET) 5-325 MG tablet Take 1 tablet by mouth every 6 (six) hours as needed. 15 tablet 0     Review of Systems  All other systems reviewed and are negative.  Blood pressure 139/85, pulse 70, temperature (!) 97 F (36.1 C), temperature source Oral, resp. rate 16, height '5\' 6"'$  (1.676 m), weight 73 kg, last menstrual period 02/15/2022, SpO2 100 %. Physical Exam Vitals reviewed.  HENT:     Head: Normocephalic.  Cardiovascular:     Rate and Rhythm: Normal rate.  Pulmonary:     Effort: Pulmonary effort is normal.  Abdominal:     Palpations: Abdomen is soft.  Genitourinary:    General: Normal vulva.  Musculoskeletal:        General: Normal range of motion.     Cervical back: Normal range of motion.  Skin:    General: Skin is warm and dry.  Neurological:     General: No focal deficit present.     Mental Status: She is alert and oriented to person, place, and time.  Psychiatric:        Mood and Affect: Mood normal.        Behavior: Behavior normal.    No results found for this or any previous visit (from the past 24 hour(s)).  No results found.  Assessment/Plan:  59Y female with AUB consented for hysteroscopy, D&C, and endometrial ablation  Patient counseled on menorrhagia management options previously and has elected for reattempt at endometrial ablation.  Patient counseled on risks and benefits of endometrial ablation with Novasure device. Discussed 92% of patients satisfied with bleeding patterns after procedure versus  1/3 of patient achieving amenorrhea rates. History of tubal ligation for contraception. Risk of infection resulting in endometritis is 1%. Rare but possible risk of damage to surrounding structures such as uterine perforation and thermal injury. Consents signed and placed in patient chart.   -Admit to OR -NPO -No antibiotics indicated -SCD VTE ppx -Routine preop/postop care -Anticipate DC home today with outpatient follow up  [place] 02/15/2022, 12:33 PM

## 2022-02-15 NOTE — Transfer of Care (Signed)
Immediate Anesthesia Transfer of Care Note  Patient: Jacqueline Medina  Procedure(s) Performed: Procedure(s) (LRB): DILATATION & CURETTAGE/HYSTEROSCOPY WITH FAILED ABLATION (N/A)  Patient Location: PACU  Anesthesia Type: General  Level of Consciousness: awake, oriented, sedated and patient cooperative  Airway & Oxygen Therapy: Patient Spontanous Breathing and Patient connected to face mask oxygen  Post-op Assessment: Report given to PACU RN and Post -op Vital signs reviewed and stable  Post vital signs: Reviewed and stable  Complications: No apparent anesthesia complications Last Vitals:  Vitals Value Taken Time  BP 144/91 02/15/22 1408  Temp 36.7 C 02/15/22 1408  Pulse 89 02/15/22 1410  Resp 15 02/15/22 1410  SpO2 100 % 02/15/22 1410  Vitals shown include unvalidated device data.  Last Pain:  Vitals:   02/15/22 1129  TempSrc: Oral  PainSc: 0-No pain      Patients Stated Pain Goal: 4 (49/61/16 4353)  Complications: No notable events documented.

## 2022-02-15 NOTE — Anesthesia Preprocedure Evaluation (Addendum)
Anesthesia Evaluation  Patient identified by MRN, date of birth, ID band Patient awake    Reviewed: Allergy & Precautions, NPO status , Patient's Chart, lab work & pertinent test results  History of Anesthesia Complications (+) PONV and history of anesthetic complications  Airway Mallampati: I  TM Distance: >3 FB Neck ROM: Full    Dental no notable dental hx. (+) Teeth Intact, Dental Advisory Given   Pulmonary neg pulmonary ROS,    Pulmonary exam normal breath sounds clear to auscultation       Cardiovascular hypertension, Normal cardiovascular exam Rhythm:Regular Rate:Normal     Neuro/Psych PSYCHIATRIC DISORDERS Anxiety CVA    GI/Hepatic Neg liver ROS, GERD  ,  Endo/Other  negative endocrine ROS  Renal/GU negative Renal ROS  negative genitourinary   Musculoskeletal negative musculoskeletal ROS (+)   Abdominal   Peds  Hematology  (+) Blood dyscrasia (on plavix), anemia ,   Anesthesia Other Findings   Reproductive/Obstetrics                             Anesthesia Physical  Anesthesia Plan  ASA: 3  Anesthesia Plan: General   Post-op Pain Management: Tylenol PO (pre-op)*   Induction: Intravenous  PONV Risk Score and Plan: 4 or greater and Ondansetron, Dexamethasone, Midazolam and Scopolamine patch - Pre-op  Airway Management Planned: LMA  Additional Equipment: None  Intra-op Plan:   Post-operative Plan: Extubation in OR  Informed Consent: I have reviewed the patients History and Physical, chart, labs and discussed the procedure including the risks, benefits and alternatives for the proposed anesthesia with the patient or authorized representative who has indicated his/her understanding and acceptance.     Dental advisory given  Plan Discussed with: CRNA  Anesthesia Plan Comments:      Anesthesia Quick Evaluation

## 2022-02-15 NOTE — Brief Op Note (Signed)
02/15/2022  2:10 PM  PATIENT:  Jacqueline Medina  51 y.o. female  PRE-OPERATIVE DIAGNOSIS:  Abnormal Uterine Bleeding  POST-OPERATIVE DIAGNOSIS:  Abnormal Uterine Bleeding  PROCEDURE:  Procedure(s): DILATATION & CURETTAGE/HYSTEROSCOPY WITH FAILED ABLATION (N/A)  SURGEON:  Surgeon(s) and Role:    * Ameliana Brashear A, DO - Primary  PHYSICIAN ASSISTANT:    ANESTHESIA:   general  EBL:  5 mL   BLOOD ADMINISTERED:none  DRAINS: none   LOCAL MEDICATIONS USED:  LIDOCAINE   SPECIMEN:  Source of Specimen:  Endometrial curettings  DISPOSITION OF SPECIMEN:  PATHOLOGY  COUNTS:  YES  TOURNIQUET:  * No tourniquets in log *  DICTATION: .Note written in EPIC  PLAN OF CARE: Discharge to home after PACU  PATIENT DISPOSITION:  PACU - hemodynamically stable.   Delay start of Pharmacological VTE agent (>24hrs) due to surgical blood loss or risk of bleeding: not applicable

## 2022-02-15 NOTE — Anesthesia Procedure Notes (Signed)
Procedure Name: LMA Insertion Date/Time: 02/15/2022 1:17 PM Performed by: Suan Halter, CRNA Pre-anesthesia Checklist: Patient identified, Emergency Drugs available, Suction available and Patient being monitored Patient Re-evaluated:Patient Re-evaluated prior to induction Oxygen Delivery Method: Circle system utilized Preoxygenation: Pre-oxygenation with 100% oxygen Induction Type: IV induction Ventilation: Mask ventilation without difficulty LMA: LMA inserted LMA Size: 4.0 Number of attempts: 1 Airway Equipment and Method: Bite block Placement Confirmation: positive ETCO2 Tube secured with: Tape Dental Injury: Teeth and Oropharynx as per pre-operative assessment

## 2022-02-15 NOTE — OR Nursing (Signed)
No specimen acquired from procedure.

## 2022-02-15 NOTE — Anesthesia Postprocedure Evaluation (Signed)
Anesthesia Post Note  Patient: Jacqueline Medina  Procedure(s) Performed: DILATATION & CURETTAGE/HYSTEROSCOPY WITH FAILED ABLATION     Patient location during evaluation: PACU Anesthesia Type: General Level of consciousness: awake and alert Pain management: pain level controlled Vital Signs Assessment: post-procedure vital signs reviewed and stable Respiratory status: spontaneous breathing, nonlabored ventilation, respiratory function stable and patient connected to nasal cannula oxygen Cardiovascular status: blood pressure returned to baseline and stable Postop Assessment: no apparent nausea or vomiting Anesthetic complications: no   No notable events documented.  Last Vitals:  Vitals:   02/15/22 1415 02/15/22 1430  BP: 140/90 (!) 146/95  Pulse: 73 69  Resp: 15 (!) 9  Temp:    SpO2: 100% 99%    Last Pain:  Vitals:   02/15/22 1415  TempSrc:   PainSc: 0-No pain                 Tiajuana Amass

## 2022-02-19 ENCOUNTER — Encounter (HOSPITAL_BASED_OUTPATIENT_CLINIC_OR_DEPARTMENT_OTHER): Payer: Self-pay | Admitting: Obstetrics and Gynecology

## 2022-03-04 ENCOUNTER — Other Ambulatory Visit: Payer: Self-pay | Admitting: Family Medicine

## 2022-03-21 ENCOUNTER — Telehealth: Payer: Self-pay | Admitting: Neurology

## 2022-03-21 NOTE — Telephone Encounter (Signed)
Received the paperwork for presurgical clearance by Dr. Valeria Batman, patient is on schedule for elective surgery for abnormal uterine bleeding,  Patient was seen by vascular neurologist Dr. Leonie Man in April 2023, she had a stroke at a young age, involving multiple right ACA, left frontal MCA, with evidence of severe MCA stenosis with moyamoya like patent,  Also has vascular risk factors of hypertension, hyperlipidemia, is on Plavix as stroke prevention  She is at high risk for recurrent stroke,  Please make sure she is well-hydrated perisurgically, maintain blood pressure around 130/80 I would think if needed may consider IV heparin bridge,  I will also forward note to Dr. 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  She lacks skin lesions, acroparesthesias and others clinical symptoms of Fabry's.  Fabry's typically affects small vessels and not large vessels in the brain also.  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 Estanislado Pandy.  She remained stable from neurological standpoint and follow-up CT angiogram on 06/01/2020 shows no major progression   Continue Plavix for stroke prevention and maintain aggressive risk factor modification withstrict control of hypertension with blood pressure goal below 130/90, diabetes with hemoglobin A1c goal below 6.5% and lipids with LDL cholesterol goal below 70 mg/dL. I also advised the patient to eat a healthy diet with plenty of whole grains, cereals, fruits and vegetables, exercise regularly and maintain ideal body  weight .I advised her to keep her self well-hydrated and to avoid hypotension.  Followup in the future with me in 1 year or call earlier if necessary."

## 2022-03-27 NOTE — Telephone Encounter (Addendum)
Note from Dr. Antony Contras:  This patient is challenging and at increased risk for stroke in the setting of hypotension which may occur with anesthesia in the postop setting as well as and Plavix will have to be held.  Maximum risk for stroke is usually because 3 to 6 months after initial stroke.  If surgery is emergently necessary it may be performed with acceptable risk if patient is willing and hold Plavix 3 to 5 days prior to the procedure and avoid hypotension and keep systolic 448-185 going in the perioperative period and resume Plavix when safe.  No need for heparin bridge.

## 2022-03-27 NOTE — Telephone Encounter (Signed)
Both Dr.Yan and Dr. Clydene Fake responses have been faxed to [facility] at [phone], atten: Dr. Lanny Cramp & Parthenia Ames.

## 2022-04-25 ENCOUNTER — Other Ambulatory Visit: Payer: Self-pay | Admitting: Obstetrics and Gynecology

## 2022-05-16 ENCOUNTER — Ambulatory Visit (HOSPITAL_BASED_OUTPATIENT_CLINIC_OR_DEPARTMENT_OTHER)
Admission: RE | Admit: 2022-05-16 | Payer: BC Managed Care – PPO | Source: Ambulatory Visit | Admitting: Obstetrics and Gynecology

## 2022-05-16 ENCOUNTER — Encounter (HOSPITAL_BASED_OUTPATIENT_CLINIC_OR_DEPARTMENT_OTHER): Admission: RE | Payer: Self-pay | Source: Ambulatory Visit

## 2022-05-16 SURGERY — XI ROBOTIC ASSISTED LAPAROSCOPIC HYSTERECTOMY AND SALPINGECTOMY
Anesthesia: General | Laterality: Bilateral

## 2022-05-24 ENCOUNTER — Encounter: Payer: Self-pay | Admitting: Adult Health

## 2022-05-24 ENCOUNTER — Ambulatory Visit (INDEPENDENT_AMBULATORY_CARE_PROVIDER_SITE_OTHER): Payer: BC Managed Care – PPO | Admitting: Adult Health

## 2022-05-24 VITALS — BP 120/80 | HR 75 | Temp 98.6°F | Wt 163.0 lb

## 2022-05-24 DIAGNOSIS — N939 Abnormal uterine and vaginal bleeding, unspecified: Secondary | ICD-10-CM | POA: Diagnosis not present

## 2022-05-24 LAB — IBC + FERRITIN
Ferritin: 26.3 ng/mL (ref 10.0–291.0)
Iron: 91 ug/dL (ref 42–145)
Saturation Ratios: 20.8 % (ref 20.0–50.0)
TIBC: 438.2 ug/dL (ref 250.0–450.0)
Transferrin: 313 mg/dL (ref 212.0–360.0)

## 2022-05-24 LAB — CBC WITH DIFFERENTIAL/PLATELET
Basophils Absolute: 0 10*3/uL (ref 0.0–0.1)
Basophils Relative: 0.7 % (ref 0.0–3.0)
Eosinophils Absolute: 0.2 10*3/uL (ref 0.0–0.7)
Eosinophils Relative: 3.4 % (ref 0.0–5.0)
HCT: 38.8 % (ref 36.0–46.0)
Hemoglobin: 13.2 g/dL (ref 12.0–15.0)
Lymphocytes Relative: 26.4 % (ref 12.0–46.0)
Lymphs Abs: 1.3 10*3/uL (ref 0.7–4.0)
MCHC: 33.9 g/dL (ref 30.0–36.0)
MCV: 93.6 fl (ref 78.0–100.0)
Monocytes Absolute: 0.6 10*3/uL (ref 0.1–1.0)
Monocytes Relative: 11.7 % (ref 3.0–12.0)
Neutro Abs: 2.9 10*3/uL (ref 1.4–7.7)
Neutrophils Relative %: 57.8 % (ref 43.0–77.0)
Platelets: 194 10*3/uL (ref 150.0–400.0)
RBC: 4.15 Mil/uL (ref 3.87–5.11)
RDW: 13.7 % (ref 11.5–15.5)
WBC: 5 10*3/uL (ref 4.0–10.5)

## 2022-05-24 NOTE — Progress Notes (Signed)
Subjective:    Patient ID: Jacqueline Medina, female    DOB: 06-10-1971, 51 y.o.   MRN: [id number]  HPI 51 year old female who  has a past medical history of Abnormal uterine bleeding (AUB), Anticoagulant long-term use, GAD (generalized anxiety disorder), GERD (gastroesophageal reflux disease), History of CVA (cerebrovascular accident) without residual deficits (02/05/2020), Hyperlipidemia, Hypertension, Iron deficiency anemia due to chronic blood loss, Moya moya disease, Occlusion and stenosis of bilateral carotid arteries, and PONV (postoperative nausea and vomiting).  She is a patient of Dr. [name] who I am seeing today with the complaint of abnormal vaginal bleeding that she has been present for 9 months. She is seen by GYN and has had two D& C's done in April and May 2023. Most recently she had Mirana IUD placed a couple of weeks ago. She is on plavix. Was hoping to have a hysterectomy but her neurologist recommended not stopping Plavix. She continues to have light vaginal bleeding daily.   She would like to check her iron and blood counts today   Denies symptoms of iron deficiency anemia   Has follow up with GYN in two weeks   Lab Results  Component Value Date   WBC 8.9 02/12/2022   HGB 15.3 (H) 02/12/2022   HCT 46.0 02/12/2022   MCV 93.5 02/12/2022   PLT 212 02/12/2022   Lab Results  Component Value Date   IRON 116 08/07/2021   TIBC 452 (H) 07/24/2021   FERRITIN 72.2 08/07/2021   Review of Systems See HPI   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 w/ bilateral severe middle cerebral  artery stenosis w/ a moyamoya like pattern, felt to be famlial   Hyperlipidemia    Hypertension    Iron deficiency anemia due to chronic blood loss    hx admission 07-24-2021 , transfused one unit blood and x2 Iron infusion   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   Occlusion and stenosis of bilateral carotid arteries    per angiogram done 02-08-2020  occlusion left ICA terminus & right ICA 50-60% stenosis   PONV (postoperative nausea and vomiting)     Social History   Socioeconomic History   Marital status: Single    Spouse name: Not on file   Number of children: 2   Years of education: Not on file   Highest education level: Not on file  Occupational History    Comment: works at [employer]   Tobacco Use   Smoking status: Never   Smokeless tobacco: Never  Vaping Use   Vaping Use: Never used  Substance and Sexual Activity   Alcohol use: Not Currently   Drug use: Never   Sexual activity: Yes    Comment: AUB since 07-2021  Other Topics Concern   Not on file  Social History Narrative   Mrs ELLIETTE SEABOLT is a 51 year old patient who works full time at [employer] 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 She lives with and has support of her daughters (63 & 69). She is independent with  care needs and denies issues with transportation to medical appointments    Right handed   Social Determinants of Health   Financial Resource Strain: Not on file  Food Insecurity: Not on file  Transportation Needs: No Transportation Needs (02/23/2020)   PRAPARE - Hydrologist (Medical): No    Lack of Transportation (Non-Medical): No  Physical Activity: Not on file  Stress: Not on file  Social Connections: Not on file  Intimate Partner Violence: Not on file    Past Surgical History:  Procedure Laterality Date   CESAREAN SECTION  1992   DILITATION & CURRETTAGE/HYSTROSCOPY WITH HYDROTHERMAL ABLATION N/A 12/25/2021   Procedure: ATTEMPTED DILATATION &  CURETTAGE/HYSTEROSCOPY WITH HYDROTHERMAL ABLATION;  Surgeon: Mora Bellman, MD;  Location: [facility];  Service: Gynecology;  Laterality: N/A;   DILITATION & CURRETTAGE/HYSTROSCOPY WITH NOVASURE ABLATION N/A 02/15/2022   Procedure: DILATATION & CURETTAGE/HYSTEROSCOPY WITH FAILED ABLATION;  Surgeon: Armandina Stammer, DO;  Location: [facility];  Service: Gynecology;  Laterality: N/A;   HYSTEROSCOPY WITH NOVASURE N/A 12/25/2021   Procedure: ATTEMPTED HYSTEROSCOPY WITH NOVASURE;  Surgeon: Mora Bellman, MD;  Location: [facility];  Service: Gynecology;  Laterality: N/A;   IR ANGIO INTRA EXTRACRAN SEL COM CAROTID INNOMINATE BILAT MOD SED  02/08/2020   IR ANGIO VERTEBRAL SEL VERTEBRAL BILAT MOD SED  02/08/2020   IR US GUIDE VASC ACCESS RIGHT  02/08/2020    Family History  Problem Relation Age of Onset   Diabetes Father    Stroke Sister    Stroke Other     No Known Allergies  Current Outpatient Medications on File Prior to Visit  Medication Sig Dispense Refill   amLODipine (NORVASC) 5 MG tablet TAKE ONE TABLET BY MOUTH EVERY NIGHT AT BEDTIME 90 tablet 2   atorvastatin (LIPITOR) 80 MG tablet TAKE ONE TABLET BY MOUTH DAILY 90 tablet 3   busPIRone (BUSPAR) 5 MG tablet TAKE ONE TABLET BY MOUTH EVERY MORNING TAKE TWO TABLETS BY MOUTH EVERY EVENING 90 tablet 6   clopidogrel (PLAVIX) 75 MG tablet TAKE ONE TABLET BY MOUTH DAILY 90 tablet 2   lisinopril (ZESTRIL) 20 MG tablet TAKE ONE TABLET BY MOUTH DAILY 90 tablet 2   Multiple Vitamins-Minerals (MULTIVITAMIN ADULTS) TABS Take 1 tablet by mouth daily.     No current facility-administered medications on file prior to visit.    BP 120/80   Pulse 75   Temp 98.6 F (37 C)   Wt 163 lb (73.9 kg)   SpO2 99%   BMI 26.31 kg/m       Objective:   Physical Exam Vitals and nursing note reviewed.  Constitutional:      Appearance: Normal appearance.  Skin:    General: Skin is warm and dry.     Capillary  Refill: Capillary refill takes less than 2 seconds.     Coloration: Skin is not pale.  Neurological:     General: No focal deficit present.     Mental Status: She is alert and oriented to person, place, and time.  Psychiatric:        Mood and Affect: Mood normal.        Behavior: Behavior normal.        Thought Content: Thought content normal.        Judgment: Judgment normal.        Assessment & Plan:  1. Abnormal vaginal bleeding  - CBC with Differential/Platelet; Future - IBC + Ferritin; Future -  IBC + Ferritin - CBC with Differential/Platelet  Dorothyann Peng, NP

## 2022-06-13 ENCOUNTER — Other Ambulatory Visit: Payer: Self-pay | Admitting: Neurology

## 2022-06-13 DIAGNOSIS — I10 Essential (primary) hypertension: Secondary | ICD-10-CM

## 2022-07-02 ENCOUNTER — Other Ambulatory Visit: Payer: Self-pay | Admitting: Family Medicine

## 2022-07-02 ENCOUNTER — Other Ambulatory Visit: Payer: Self-pay | Admitting: Neurology

## 2022-07-02 DIAGNOSIS — I10 Essential (primary) hypertension: Secondary | ICD-10-CM

## 2022-07-02 DIAGNOSIS — F419 Anxiety disorder, unspecified: Secondary | ICD-10-CM

## 2022-07-02 MED ORDER — LISINOPRIL 20 MG PO TABS: 20.0000 mg | ORAL_TABLET | Freq: Every day | ORAL | 0 refills | Status: DC

## 2022-07-02 NOTE — Telephone Encounter (Signed)
I spoke with the patient. As per Dr. Clydene Fake last note the patient was getting refills of her medication from her PCP, Dr. [name]. However the rx was last sent by Dr. Leonie Man. The patient contacted her PCP for refills, but it was denied because it had previously been refilled by Dr. Leonie Man.  The patient is currently taking amlodipine, plavix, and lisinopril.

## 2022-07-02 NOTE — Telephone Encounter (Signed)
Pt is calling. Requesting a refill on lisinopril (ZESTRIL) 20 MG tablet. Refill should be sent to [facility] [phone].

## 2022-07-09 NOTE — Progress Notes (Unsigned)
ACUTE VISIT Chief Complaint  Patient presents with   Leg Pain    Both legs   HPI: [name] is a 51 y.o. female, who is here today complaining of leg pain that began one to two months ago. The pain is predominantly located in the posterior aspect of her legs and is accompanied by ankle swelling when standing for extended periods. The pain is described as achy and is exacerbated when sitting for a while and then standing up, but improves with movement. There is no associated change in skin color, numbness, or tingling.   Additionally, the patient reports back pain on her left side, which does not radiate to her legs. She believes that lifting tires may have contributed to the onset of her back pain.  HPI Last follow up on 09/10/21.  Hypertension:  Medications:*** BP readings at home:*** Side effects:*** Negative for unusual or severe headache, visual changes, exertional chest pain, dyspnea,  focal weakness, or edema.   Lab Results  Component Value Date   CHOL 112 09/10/2021   HDL 39.70 09/10/2021   LDLCALC 56 09/10/2021   TRIG 81.0 09/10/2021   CHOLHDL 3 09/10/2021    Review of Systems Rest see pertinent positives and negatives per HPI.  Current Outpatient Medications on File Prior to Visit  Medication Sig Dispense Refill   amLODipine (NORVASC) 5 MG tablet TAKE ONE TABLET BY MOUTH EVERY NIGHT AT BEDTIME 90 tablet 2   atorvastatin (LIPITOR) 80 MG tablet TAKE ONE TABLET BY MOUTH DAILY 90 tablet 3   busPIRone (BUSPAR) 5 MG tablet TAKE ONE TABLET BY MOUTH EVERY MORNING AND TAKE TWO TABLETS BY MOUTH EVERY EVENING 90 tablet 3   clopidogrel (PLAVIX) 75 MG tablet TAKE ONE TABLET BY MOUTH DAILY 90 tablet 2   Multiple Vitamins-Minerals (MULTIVITAMIN ADULTS) TABS Take 1 tablet by mouth daily.     No current facility-administered medications on file prior to visit.   Past Medical History:  Diagnosis Date   Abnormal uterine bleeding (AUB)    since 07-2021   Anticoagulant  long-term use    plavix--- managed by dr Leonie Man (neurologist)   GAD (generalized anxiety disorder)    GERD (gastroesophageal reflux disease)    History of CVA (cerebrovascular accident) without residual deficits 02/05/2020   neurologist--- dr Leonie Man;   admission in epic,  multiple right ACA & left frontal MCA branch infarcts w/ bilateral severe middle cerebral artery stenosis w/ a moyamoya like pattern, felt to be famlial   Hyperlipidemia    Hypertension    Iron deficiency anemia due to chronic blood loss    hx admission 07-24-2021 , transfused one unit blood and x2 Iron infusion   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   Occlusion and stenosis of bilateral carotid arteries    per angiogram done 02-08-2020  occlusion left ICA terminus & right ICA 50-60% stenosis   PONV (postoperative nausea and vomiting)    No Known Allergies  Social History   Socioeconomic History   Marital status: Single    Spouse name: Not on file   Number of children: 2   Years of education: Not on file   Highest education level: 12th grade  Occupational History    Comment: works at [employer]   Tobacco Use   Smoking status: Never   Smokeless tobacco: Never  Vaping Use   Vaping Use: Never used  Substance and Sexual Activity   Alcohol use: Not Currently   Drug use:  Never   Sexual activity: Yes    Comment: AUB since 07-2021  Other Topics Concern   Not on file  Social History Narrative   Jacqueline Medina is a 51 year old patient who works full time at [employer] 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 She lives with and has support of her daughters (44 & 94). She is independent with care needs and denies issues with transportation to medical appointments    Right handed   Social Determinants of Health   Financial Resource Strain: Low Risk  (07/09/2022)   Overall Financial Resource Strain (CARDIA)    Difficulty of Paying Living Expenses: Not hard at all  Food Insecurity: No Food  Insecurity (07/09/2022)   Hunger Vital Sign    Worried About Running Out of Food in the Last Year: Never true    Ran Out of Food in the Last Year: Never true  Transportation Needs: No Transportation Needs (07/09/2022)   PRAPARE - Hydrologist (Medical): No    Lack of Transportation (Non-Medical): No  Physical Activity: Insufficiently Active (07/09/2022)   Exercise Vital Sign    Days of Exercise per Week: 3 days    Minutes of Exercise per Session: 10 min  Stress: No Stress Concern Present (07/09/2022)   [place]    Feeling of Stress : Only a little  Social Connections: Socially Integrated (07/09/2022)   Social Connection and Isolation Panel [NHANES]    Frequency of Communication with Friends and Family: More than three times a week    Frequency of Social Gatherings with Friends and Family: Once a week    Attends Religious Services: More than 4 times per year    Active Member of [employer] or Organizations: Yes    Attends Archivist Meetings: More than 4 times per year    Marital Status: Married   Vitals:   07/10/22 1107  BP: 128/80  Pulse: 74  Resp: 12  Temp: 98.8 F (37.1 C)  SpO2: 98%   Body mass index is 26.07 kg/m.  Physical Exam Vitals and nursing note reviewed.  Constitutional:      General: She is not in acute distress.    Appearance: She is well-developed.  HENT:     Head: Normocephalic and atraumatic.     Mouth/Throat:     Mouth: Mucous membranes are moist.     Pharynx: Oropharynx is clear.  Eyes:     Conjunctiva/sclera: Conjunctivae normal.  Cardiovascular:     Rate and Rhythm: Normal rate and regular rhythm.     Pulses:          Dorsalis pedis pulses are 2+ on the right side and 2+ on the left side.     Heart sounds: No murmur heard.    Comments: No pain of calves  Pulmonary:     Effort: Pulmonary effort is normal. No respiratory distress.     Breath  sounds: Normal breath sounds.  Abdominal:     Palpations: Abdomen is soft. There is no hepatomegaly or mass.     Tenderness: There is no abdominal tenderness.  Lymphadenopathy:     Cervical: No cervical adenopathy.  Skin:    General: Skin is warm.     Findings: No erythema or rash.  Neurological:     General: No focal deficit present.     Mental Status: She is alert and oriented to person, place, and time.  Cranial Nerves: No cranial nerve deficit.     Gait: Gait normal.  Psychiatric:     Comments: Well groomed, good eye contact.     ASSESSMENT AND PLAN:  Jacqueline Medina was seen today for leg pain.  Diagnoses and all orders for this visit:  Lower extremity pain, posterior, unspecified laterality -     Basic metabolic panel; Future -     CK; Future -     CK -     Basic metabolic panel  Essential hypertension, benign -     lisinopril (ZESTRIL) 20 MG tablet; Take 1 tablet (20 mg total) by mouth daily. -     Basic metabolic panel; Future -     Hepatic function panel; Future -     Hepatic function panel -     Basic metabolic panel  Hyperlipidemia, unspecified hyperlipidemia type -     Hepatic function panel; Future -     Hepatic function panel  Anxiety  Need for influenza vaccination -     Flu Vaccine QUAD 41moIM (Fluarix, Fluzone & Alfiuria Quad PF)  Need for Tdap vaccination -     Tdap vaccine greater than or equal to 7yo IM     Return in about 6 months (around 01/09/2023).   Dorthia Tout G. JMartinique MD  [facility].  Discharge Instructions   None

## 2022-07-10 ENCOUNTER — Encounter: Payer: Self-pay | Admitting: Family Medicine

## 2022-07-10 ENCOUNTER — Ambulatory Visit (INDEPENDENT_AMBULATORY_CARE_PROVIDER_SITE_OTHER): Payer: BC Managed Care – PPO | Admitting: Family Medicine

## 2022-07-10 VITALS — BP 128/80 | HR 74 | Temp 98.8°F | Resp 12 | Ht 66.0 in | Wt 161.5 lb

## 2022-07-10 DIAGNOSIS — M79606 Pain in leg, unspecified: Secondary | ICD-10-CM

## 2022-07-10 DIAGNOSIS — E785 Hyperlipidemia, unspecified: Secondary | ICD-10-CM

## 2022-07-10 DIAGNOSIS — Z23 Encounter for immunization: Secondary | ICD-10-CM | POA: Diagnosis not present

## 2022-07-10 DIAGNOSIS — F419 Anxiety disorder, unspecified: Secondary | ICD-10-CM | POA: Diagnosis not present

## 2022-07-10 DIAGNOSIS — I10 Essential (primary) hypertension: Secondary | ICD-10-CM | POA: Diagnosis not present

## 2022-07-10 LAB — BASIC METABOLIC PANEL
BUN: 13 mg/dL (ref 6–23)
CO2: 28 mEq/L (ref 19–32)
Calcium: 9.7 mg/dL (ref 8.4–10.5)
Chloride: 105 mEq/L (ref 96–112)
Creatinine, Ser: 0.92 mg/dL (ref 0.40–1.20)
GFR: 71.19 mL/min (ref >60.00)
Glucose, Bld: 93 mg/dL (ref 70–99)
Potassium: 3.9 mEq/L (ref 3.5–5.1)
Sodium: 141 mEq/L (ref 135–145)

## 2022-07-10 LAB — HEPATIC FUNCTION PANEL
ALT: 19 U/L (ref 0–35)
AST: 22 U/L (ref 0–37)
Albumin: 4.5 g/dL (ref 3.5–5.2)
Alkaline Phosphatase: 125 U/L — ABNORMAL HIGH (ref 39–117)
Bilirubin, Direct: 0.2 mg/dL (ref 0.0–0.3)
Total Bilirubin: 1.1 mg/dL (ref 0.2–1.2)
Total Protein: 7.5 g/dL (ref 6.0–8.3)

## 2022-07-10 LAB — CK: Total CK: 256 U/L — ABNORMAL HIGH (ref 7–177)

## 2022-07-10 MED ORDER — LISINOPRIL 20 MG PO TABS: 20.0000 mg | ORAL_TABLET | Freq: Every day | ORAL | 1 refills | Status: DC

## 2022-07-10 NOTE — Assessment & Plan Note (Addendum)
BP adequately controlled. Continue current management: Amlodipine 5 mg daily and Lisinopril 20 mg daily. DASH/low salt diet to continue Monitor BP at home.

## 2022-07-10 NOTE — Assessment & Plan Note (Signed)
Last LDL 56 in 08/2021. She is not fasting today, so will plan on doing FLP next visit. Continue Atorvastatin 80 mg daily and low fat diet.

## 2022-07-10 NOTE — Patient Instructions (Addendum)
A few things to remember from today's visit:  Lower extremity pain, posterior, unspecified laterality - Plan: Basic metabolic panel, CK  Essential hypertension, benign - Plan: lisinopril (ZESTRIL) 20 MG tablet, Basic metabolic panel, Hepatic function panel  Hyperlipidemia, unspecified hyperlipidemia type - Plan: Hepatic function panel  Leg pain could be arthritis of knees.  Stretching exercises may help. Monitor for new symptoms. Stay hydrated. No changes in rest of meds.  If you need refills for medications you take chronically, please call your pharmacy. Do not use My Chart to request refills or for acute issues that need immediate attention. If you send a my chart message, it may take a few days to be addressed, specially if I am not in the office.  Please be sure medication list is accurate. If a new problem present, please set up appointment sooner than planned today.

## 2022-07-10 NOTE — Assessment & Plan Note (Signed)
Problem is well controlled. Continue Buspar 5 mg am and 10 mg pm.

## 2022-08-12 NOTE — Progress Notes (Signed)
ACUTE VISIT Chief Complaint  Patient presents with   Urinary Frequency   HPI: JacquelineJacqueline Medina is a 51 y.o. female with medical hx significant for HTN,HLD,moya moya disease,CVA, and DUB (fibroid tumors) here today complaining of urgency and occasional urine leakage when she feels the need to urinate. These symptoms have been intermittent for a while but worse for the past 1-2 days. She confirms nocturia, stating that she had to use the bathroom approximately every two hours the previous night. She denies any dysuria or gross hematuria. She consumes one cup of caffeine daily.  Urinary Frequency  This is a new problem. The current episode started in the past 7 days. The problem has been unchanged. The pain is at a severity of 0/10. There has been no fever. There is No history of pyelonephritis. Associated symptoms include frequency and hesitancy. Pertinent negatives include no chills, discharge, flank pain, nausea, sweats or vomiting. She has tried nothing for the symptoms.   She also mentions experiencing pain and stiffness in her hands for a while, with difficulty bending them, R>L. She denies any numbness or tingling.  She has not noted edema or erythema. Right handed.  HLD: Last FLP  in 08/2021. She is on Atorvastatin 80 mg daily. LDL 56. +CVA, she follows with neurologist.  Review of Systems  Constitutional:  Negative for chills.  Respiratory:  Negative for cough and shortness of breath.   Cardiovascular:  Negative for chest pain and leg swelling.  Gastrointestinal:  Negative for abdominal pain, nausea and vomiting.  Genitourinary:  Positive for frequency, hesitancy and menstrual problem. Negative for flank pain, vaginal bleeding and vaginal discharge.  Musculoskeletal:  Positive for arthralgias.  Neurological:  Negative for syncope, facial asymmetry and weakness.  See other pertinent positives and negatives in HPI.  Current Outpatient Medications on File Prior to Visit   Medication Sig Dispense Refill   amLODipine (NORVASC) 5 MG tablet TAKE ONE TABLET BY MOUTH EVERY NIGHT AT BEDTIME 90 tablet 2   atorvastatin (LIPITOR) 80 MG tablet TAKE ONE TABLET BY MOUTH DAILY 90 tablet 3   busPIRone (BUSPAR) 5 MG tablet TAKE ONE TABLET BY MOUTH EVERY MORNING AND TAKE TWO TABLETS BY MOUTH EVERY EVENING 90 tablet 3   clopidogrel (PLAVIX) 75 MG tablet TAKE ONE TABLET BY MOUTH DAILY 90 tablet 2   lisinopril (ZESTRIL) 20 MG tablet Take 1 tablet (20 mg total) by mouth daily. 90 tablet 1   Multiple Vitamins-Minerals (MULTIVITAMIN ADULTS) TABS Take 1 tablet by mouth daily.     No current facility-administered medications on file prior to visit.   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 w/ bilateral severe middle cerebral artery stenosis w/ a moyamoya like pattern, felt to be famlial   Hyperlipidemia    Hypertension    Iron deficiency anemia due to chronic blood loss    hx admission 07-24-2021 , transfused one unit blood and x2 Iron infusion   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   Occlusion and stenosis of bilateral carotid arteries    per angiogram done 02-08-2020  occlusion left ICA terminus & right ICA 50-60% stenosis   PONV (postoperative nausea and vomiting)    No Known Allergies  Social History   Socioeconomic History   Marital status: Single    Spouse name: Not on file   Number of children: 2   Years of education: Not on file   Highest education level: 12th grade  Occupational History    Comment: works at [employer]   Tobacco Use   Smoking status: Never    Smokeless tobacco: Never  Vaping Use   Vaping Use: Never used  Substance and Sexual Activity   Alcohol use: Not Currently   Drug use: Never   Sexual activity: Yes    Comment: AUB since 07-2021  Other Topics Concern   Not on file  Social History Narrative   Jacqueline Medina is a 51 year old patient who works full time at [employer] 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 She lives with and has support of her daughters (58 & 92). She is independent with care needs and denies issues with transportation to medical appointments    Right handed   Social Determinants of Health   Financial Resource Strain: Low Risk  (07/09/2022)   Overall Financial Resource Strain (CARDIA)    Difficulty of Paying Living Expenses: Not hard at all  Food Insecurity: No Food Insecurity (07/09/2022)   Hunger Vital Sign    Worried About Running Out of Food in the Last Year: Never true    Ran Out of Food in the Last Year: Never true  Transportation Needs: No Transportation Needs (07/09/2022)   PRAPARE - Hydrologist (Medical): No    Lack of Transportation (Non-Medical): No  Physical Activity: Insufficiently Active (07/09/2022)   Exercise Vital Sign    Days of Exercise per Week: 3 days    Minutes of Exercise per Session: 10 min  Stress: No Stress Concern Present (07/09/2022)   [place]    Feeling of Stress : Only a little  Social Connections: Socially Integrated (07/09/2022)   Social Connection and Isolation Panel [NHANES]    Frequency of Communication with Friends and Family: More than three times a week    Frequency of Social Gatherings with Friends and Family: Once a week    Attends Religious Services: More than 4 times per year    Active Member of Clubs or Organizations: Yes    Attends Archivist Meetings: More than 4 times per year    Marital Status: Married   Vitals:   08/13/22 0812   BP: 126/80  Pulse: 76  Resp: 12  Temp: 98.1 F (36.7 C)  SpO2: 97%   Body mass index is 26.21 kg/m.  Physical Exam Vitals and nursing note reviewed.  Constitutional:      General: She is not in acute distress.    Appearance: She is well-developed.  HENT:     Head: Normocephalic and atraumatic.  Eyes:     Conjunctiva/sclera: Conjunctivae normal.  Cardiovascular:     Rate and Rhythm: Normal rate and regular rhythm.     Heart sounds: No murmur heard. Pulmonary:     Effort: Pulmonary effort is normal. No respiratory distress.     Breath sounds: Normal breath sounds.  Abdominal:     Palpations: Abdomen is soft. There is no mass.     Tenderness: There is no abdominal tenderness. There is no right CVA  tenderness or left CVA tenderness.  Musculoskeletal:     Comments: Mild IP limitation of flexion, right hand. No signs of synovitis.  Skin:    General: Skin is warm.     Findings: No erythema.  Neurological:     General: No focal deficit present.     Mental Status: She is alert and oriented to person, place, and time.  Psychiatric:        Mood and Affect: Mood and affect normal.   ASSESSMENT AND PLAN: Jacqueline Medina was seen today for urinary frequency.  Diagnoses and all orders for this visit: Lab Results  Component Value Date   CHOL 137 08/13/2022   HDL 43.40 08/13/2022   LDLCALC 73 08/13/2022   TRIG 105.0 08/13/2022   CHOLHDL 3 08/13/2022   Urinary incontinence, urge Assessment & Plan: We discussed pharmacologic treatment options. She agrees with trying Oxybutynin ER , starting low dose, 5 mg daily. Some side effects discussed. UA and Ucx reflex will be obtained today.   Orders: -     oxyBUTYnin Chloride ER; Take 1 tablet (5 mg total) by mouth at bedtime.  Dispense: 30 tablet; Refill: 1 -     Urine Culture; Future -     Urinalysis, Routine w reflex microscopic; Future  Urinary frequency Assessment & Plan: We discussed possible causes, overactive bladder,  fibroid tumors can also be a contributing factor.   Orders: -     oxyBUTYnin Chloride ER; Take 1 tablet (5 mg total) by mouth at bedtime.  Dispense: 30 tablet; Refill: 1 -     Urine Culture; Future -     Urinalysis, Routine w reflex microscopic; Future  Hyperlipidemia, unspecified hyperlipidemia type Assessment & Plan: Last LDL 56 in 08/2021. Continue Atorvastatin 80 mg daily and low fat diet.  Orders: -     Lipid panel; Future  Osteoarthritis of both hands, unspecified osteoarthritis type Assessment & Plan: We discussed Dx,prognosis,and treatment options. Because co-morbilities NSAID's are not recommended. Tylenol 500 mg 3-4 times per day as needed recommended.    Moya moya disease Assessment & Plan: Continue Atorvastatin and Plavix. Last LDL 56 in 08/2021.    Return in about 6 weeks (around 09/24/2022).  Desiree Fleming G. [name], MD  [facility]. [place] office.

## 2022-08-13 ENCOUNTER — Ambulatory Visit (INDEPENDENT_AMBULATORY_CARE_PROVIDER_SITE_OTHER): Payer: BC Managed Care – PPO | Admitting: Family Medicine

## 2022-08-13 ENCOUNTER — Encounter: Payer: Self-pay | Admitting: Family Medicine

## 2022-08-13 VITALS — BP 126/80 | HR 76 | Temp 98.1°F | Resp 12 | Ht 66.0 in | Wt 162.4 lb

## 2022-08-13 DIAGNOSIS — M19041 Primary osteoarthritis, right hand: Secondary | ICD-10-CM | POA: Diagnosis not present

## 2022-08-13 DIAGNOSIS — R350 Frequency of micturition: Secondary | ICD-10-CM

## 2022-08-13 DIAGNOSIS — M19042 Primary osteoarthritis, left hand: Secondary | ICD-10-CM

## 2022-08-13 DIAGNOSIS — N3941 Urge incontinence: Secondary | ICD-10-CM | POA: Diagnosis not present

## 2022-08-13 DIAGNOSIS — I675 Moyamoya disease: Secondary | ICD-10-CM

## 2022-08-13 DIAGNOSIS — E785 Hyperlipidemia, unspecified: Secondary | ICD-10-CM

## 2022-08-13 LAB — LIPID PANEL
Cholesterol: 137 mg/dL (ref 0–200)
HDL: 43.4 mg/dL (ref >39.00)
LDL Cholesterol: 73 mg/dL (ref 0–99)
NonHDL: 93.77
Total CHOL/HDL Ratio: 3
Triglycerides: 105 mg/dL (ref 0.0–149.0)
VLDL: 21 mg/dL (ref 0.0–40.0)

## 2022-08-13 LAB — URINALYSIS, ROUTINE W REFLEX MICROSCOPIC
Bilirubin Urine: NEGATIVE
Ketones, ur: NEGATIVE
Nitrite: NEGATIVE
Specific Gravity, Urine: 1.02 (ref 1.000–1.030)
Total Protein, Urine: NEGATIVE
Urine Glucose: NEGATIVE
Urobilinogen, UA: 0.2 (ref 0.0–1.0)
pH: 6.5 (ref 5.0–8.0)

## 2022-08-13 MED ORDER — OXYBUTYNIN CHLORIDE ER 5 MG PO TB24: 5.0000 mg | ORAL_TABLET | Freq: Every day | ORAL | 1 refills | Status: DC

## 2022-08-13 NOTE — Patient Instructions (Addendum)
A few things to remember from today's visit:  Urinary frequency - Plan: oxybutynin (DITROPAN-XL) 5 MG 24 hr tablet  Urinary incontinence, urge - Plan: oxybutynin (DITROPAN-XL) 5 MG 24 hr tablet Oxybutynin at bedtime added today. During the day schedule urination every 3 hours.  Avoid fluid intake 3 hours before bedtime. If you need refills for medications you take chronically, please call your pharmacy. Do not use My Chart to request refills or for acute issues that need immediate attention. If you send a my chart message, it may take a few days to be addressed, specially if I am not in the office.  Please be sure medication list is accurate. If a new problem present, please set up appointment sooner than planned today.

## 2022-08-13 NOTE — Assessment & Plan Note (Signed)
Last LDL 56 in 08/2021. Continue Atorvastatin 80 mg daily and low fat diet.

## 2022-08-15 LAB — URINE CULTURE
MICRO NUMBER:: 14218600
SPECIMEN QUALITY:: ADEQUATE

## 2022-08-17 DIAGNOSIS — E7521 Fabry (-Anderson) disease: Secondary | ICD-10-CM | POA: Insufficient documentation

## 2022-08-17 MED ORDER — CEPHALEXIN 500 MG PO CAPS: 500.0000 mg | ORAL_CAPSULE | Freq: Two times a day (BID) | ORAL | 0 refills | Status: AC

## 2022-08-17 NOTE — Assessment & Plan Note (Signed)
Continue Atorvastatin and Plavix. Last LDL 56 in 08/2021.

## 2022-08-17 NOTE — Assessment & Plan Note (Signed)
We discussed Dx,prognosis,and treatment options. Because co-morbilities NSAID's are not recommended. Tylenol 500 mg 3-4 times per day as needed recommended.

## 2022-08-17 NOTE — Assessment & Plan Note (Signed)
We discussed possible causes, overactive bladder, fibroid tumors can also be a contributing factor.

## 2022-08-17 NOTE — Assessment & Plan Note (Signed)
We discussed pharmacologic treatment options. She agrees with trying Oxybutynin ER , starting low dose, 5 mg daily. Some side effects discussed. UA and Ucx reflex will be obtained today.

## 2022-09-27 ENCOUNTER — Ambulatory Visit: Payer: BC Managed Care – PPO | Admitting: Family Medicine

## 2022-10-01 NOTE — Progress Notes (Signed)
HPI: [name] is a 52 y.o. female with PMHx significant for HTN,HLD,moya moya disease,CVA, and DUB (fibroid tumors) here today to follow on recent visit. Last visit on 08/13/22, when she was c/o urinary urgency,frequency,and incontinence. Oxybutynin XL 5 mg was recommended has been effective, but on some nights, especially when she drinks more water during the day, she still experiences urinary frequency, needing to get up two or three times during the night. She also mentions experiencing dry mouth as a side effect of the medication. Urinary urgency and incontinence have resolved.  She complains of persistent leg pain, particularly in the calves and behind knees, which has been present since 04/2022, addressed in 06/2022. She describes the pain as severe, especially after sitting for long periods, such as during her drive home from work. The pain causes difficulty in getting out of the car. She states that she walks like she is walking like "an old lady." The pain improves after walking for a few minutes. She denies any edema or erythema on affected areas.  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 Pain is not radiated. Negative for saddle anesthesia, numbness,tingling, or burning.  HTN on Amlodipine 5 mg daily and Lisinopril 20 mg daily. Negative for severe/frequent headache, visual changes, chest pain, dyspnea, palpitation, focal weakness, or edema. Lab Results  Component Value Date   CREATININE 0.92 07/10/2022   BUN 13 07/10/2022   NA 141 07/10/2022   K 3.9 07/10/2022   CL 105 07/10/2022   CO2 28 07/10/2022   Review of Systems  Constitutional:  Negative for activity change, appetite change, chills and fever.  Respiratory:  Negative for cough and wheezing.   Gastrointestinal:  Negative for abdominal pain, nausea and vomiting.  Genitourinary:  Negative for decreased urine volume, dysuria and hematuria.  Neurological:   Negative for syncope and facial asymmetry.  See other pertinent positives and negatives in HPI.  Current Outpatient Medications on File Prior to Visit  Medication Sig Dispense Refill   amLODipine (NORVASC) 5 MG tablet TAKE ONE TABLET BY MOUTH EVERY NIGHT AT BEDTIME 90 tablet 2   atorvastatin (LIPITOR) 80 MG tablet TAKE ONE TABLET BY MOUTH DAILY 90 tablet 3   busPIRone (BUSPAR) 5 MG tablet TAKE ONE TABLET BY MOUTH EVERY MORNING AND TAKE TWO TABLETS BY MOUTH EVERY EVENING 90 tablet 3   clopidogrel (PLAVIX) 75 MG tablet TAKE ONE TABLET BY MOUTH DAILY 90 tablet 2   lisinopril (ZESTRIL) 20 MG tablet Take 1 tablet (20 mg total) by mouth daily. 90 tablet 1   Multiple Vitamins-Minerals (MULTIVITAMIN ADULTS) TABS Take 1 tablet by mouth daily.     No current facility-administered medications on file prior to visit.    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 w/ bilateral severe middle cerebral artery stenosis w/ a moyamoya like pattern, felt to be famlial   Hyperlipidemia    Hypertension    Iron deficiency anemia due to chronic blood loss    hx admission 07-24-2021 , transfused one unit blood and x2 Iron  infusion   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   Occlusion and stenosis of bilateral carotid arteries    per angiogram done 02-08-2020  occlusion left ICA terminus & right ICA 50-60% stenosis   PONV (postoperative nausea and vomiting)    No Known Allergies  Social History   Socioeconomic History   Marital status: Single    Spouse name: Not on file   Number of children: 2   Years of education: Not  on file   Highest education level: 12th grade  Occupational History    Comment: works at [employer]   Tobacco Use   Smoking status: Never   Smokeless tobacco: Never  Vaping Use   Vaping Use: Never used  Substance and Sexual Activity   Alcohol use: Not Currently   Drug use: Never   Sexual activity: Yes    Comment: AUB since 07-2021  Other Topics Concern   Not on file  Social History Narrative   [name] is a 52 year old patient who works full time at [employer] 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 She lives with and has support of her daughters (78 & 58). She is independent with care needs and denies issues with transportation to medical appointments    Right handed   Social Determinants of Health   Financial Resource Strain: Low Risk  (07/09/2022)   Overall Financial Resource Strain (CARDIA)    Difficulty of Paying Living Expenses: Not hard at all  Food Insecurity: No Food Insecurity (07/09/2022)   Hunger Vital Sign    Worried About Running Out of Food in the Last Year: Never true    Ran Out of Food in the Last Year: Never true  Transportation Needs: No Transportation Needs (07/09/2022)   PRAPARE - Hydrologist (Medical): No    Lack of Transportation (Non-Medical): No  Physical Activity: Insufficiently Active (07/09/2022)   Exercise Vital Sign    Days of Exercise per Week: 3 days    Minutes of Exercise per Session: 10 min  Stress: No Stress Concern Present (07/09/2022)   [place]    Feeling of Stress : Only a little  Social Connections: Socially Integrated (07/09/2022)   Social Connection and Isolation Panel [NHANES]    Frequency of Communication with Friends and Family: More than three times a week    Frequency of Social Gatherings with Friends and Family: Once a week    Attends Religious Services: More than 4 times per year    Active Member of [employer] or  Organizations: Yes    Attends Archivist Meetings: More than 4 times per year    Marital Status: Married    Vitals:   10/02/22 1213  BP: 118/72  Pulse: 79  Temp: 98.6 F (37 C)  SpO2: 99%   Body mass index is 25.89 kg/m.  Physical Exam Vitals and nursing note reviewed.  Constitutional:      General: She is not in acute distress.    Appearance: She is well-developed.  HENT:     Head: Normocephalic and atraumatic.  Eyes:     Conjunctiva/sclera: Conjunctivae normal.  Cardiovascular:     Rate and Rhythm: Normal rate and regular rhythm.     Pulses:          Dorsalis pedis pulses are 2+ on the right side and 2+ on the left side.  Heart sounds: No murmur heard. Pulmonary:     Effort: Pulmonary effort is normal. No respiratory distress.     Breath sounds: Normal breath sounds.  Abdominal:     Palpations: Abdomen is soft. There is no hepatomegaly or mass.     Tenderness: There is no abdominal tenderness.  Musculoskeletal:     Right knee: Crepitus present. Normal range of motion. No tenderness.     Left knee: Crepitus present. Normal range of motion. No tenderness.  Skin:    General: Skin is warm.     Findings: No erythema or rash.  Neurological:     General: No focal deficit present.     Mental Status: She is alert and oriented to person, place, and time.     Cranial Nerves: No cranial nerve deficit.     Gait: Gait normal.  Psychiatric:        Mood and Affect: Affect normal. Mood is anxious.   ASSESSMENT AND PLAN:  Ms.Aiyana was seen today for follow-up.  Diagnoses and all orders for this visit:  Lower extremity pain, posterior, unspecified laterality Assessment & Plan: We reviewed possible causes, ? Knee OA, radicular pain,and med side effects among some to consider. Examination today and hx do not suggest a serious process, DP palpable. She was instructed to stop Atorvastatin for 2-3 weeks and monitor for changes.   Urinary frequency Assessment &  Plan: Improved but still getting up through the night to void. Recommend trying to decreased fluid intake 3 hours before bedtime. We could increase dose of Oxybutynin, side effects discussed, including urinary retention. She prefers to continue current management. Continue monitoring for new symptoms.  Orders: -     oxyBUTYnin Chloride ER; Take 1 tablet (5 mg total) by mouth at bedtime.  Dispense: 90 tablet; Refill: 1  Urinary incontinence, urge Assessment & Plan: This problem has resolved with Oxybutynin XL 5 mg daily, so no changes. Continue Kegel exercises.  Orders: -     oxyBUTYnin Chloride ER; Take 1 tablet (5 mg total) by mouth at bedtime.  Dispense: 90 tablet; Refill: 1  Essential hypertension, benign Assessment & Plan: BP adequately controlled. Continue current management: Amlodipine 5 mg daily and Lisinopril 20 mg daily as well as low salt diet. Monitor BP at home.   Hyperlipidemia, unspecified hyperlipidemia type Assessment & Plan: LDL 73 in 07/2022. She is on Atorvastatin 80 mg daily, which she will hold for a few weeks due to LE pain. If LE pain improves or resolves after holding Atorvastatin, we will consider Rosuvastatin or other statin.   Return in about 5 months (around 03/03/2023).  Clarity Ciszek G. [name], MD  [facility]. [place] office.

## 2022-10-02 ENCOUNTER — Ambulatory Visit (INDEPENDENT_AMBULATORY_CARE_PROVIDER_SITE_OTHER): Payer: BC Managed Care – PPO | Admitting: Family Medicine

## 2022-10-02 ENCOUNTER — Encounter: Payer: Self-pay | Admitting: Family Medicine

## 2022-10-02 VITALS — BP 118/72 | HR 79 | Temp 98.6°F | Ht 66.0 in | Wt 160.4 lb

## 2022-10-02 DIAGNOSIS — M79606 Pain in leg, unspecified: Secondary | ICD-10-CM

## 2022-10-02 DIAGNOSIS — I10 Essential (primary) hypertension: Secondary | ICD-10-CM | POA: Diagnosis not present

## 2022-10-02 DIAGNOSIS — N3941 Urge incontinence: Secondary | ICD-10-CM | POA: Diagnosis not present

## 2022-10-02 DIAGNOSIS — E785 Hyperlipidemia, unspecified: Secondary | ICD-10-CM

## 2022-10-02 DIAGNOSIS — R350 Frequency of micturition: Secondary | ICD-10-CM | POA: Diagnosis not present

## 2022-10-02 MED ORDER — OXYBUTYNIN CHLORIDE ER 5 MG PO TB24: 5.0000 mg | ORAL_TABLET | Freq: Every day | ORAL | 1 refills | Status: DC

## 2022-10-02 NOTE — Patient Instructions (Signed)
A few things to remember from today's visit:  Essential hypertension, benign  Urinary frequency - Plan: oxybutynin (DITROPAN-XL) 5 MG 24 hr tablet  Urinary incontinence, urge - Plan: oxybutynin (DITROPAN-XL) 5 MG 24 hr tablet  Hyperlipidemia, unspecified hyperlipidemia type  Lower extremity pain, posterior, unspecified laterality  No changes in medication for the urine. Hold on Atorvastatin for 2-3 weeks and monitor for changes in legs pain, if you notice improvement we need to decide if you decrease dose,frequency,or change to a different medication.  If you need refills for medications you take chronically, please call your pharmacy. Do not use My Chart to request refills or for acute issues that need immediate attention. If you send a my chart message, it may take a few days to be addressed, specially if I am not in the office.  Please be sure medication list is accurate. If a new problem present, please set up appointment sooner than planned today.

## 2022-10-05 NOTE — Assessment & Plan Note (Signed)
We reviewed possible causes, ? Knee OA, radicular pain,and med side effects among some to consider. Examination today and hx do not suggest a serious process, DP palpable. She was instructed to stop Atorvastatin for 2-3 weeks and monitor for changes.

## 2022-10-05 NOTE — Assessment & Plan Note (Signed)
LDL 73 in 07/2022. She is on Atorvastatin 80 mg daily, which she will hold for a few weeks due to LE pain. If LE pain improves or resolves after holding Atorvastatin, we will consider Rosuvastatin or other statin.

## 2022-10-05 NOTE — Assessment & Plan Note (Signed)
This problem has resolved with Oxybutynin XL 5 mg daily, so no changes. Continue Kegel exercises.

## 2022-10-05 NOTE — Assessment & Plan Note (Addendum)
Improved but still getting up through the night to void. Recommend trying to decreased fluid intake 3 hours before bedtime. We could increase dose of Oxybutynin, side effects discussed, including urinary retention. She prefers to continue current management. Continue monitoring for new symptoms.

## 2022-10-05 NOTE — Assessment & Plan Note (Signed)
BP adequately controlled. Continue current management: Amlodipine 5 mg daily and Lisinopril 20 mg daily as well as low salt diet. Monitor BP at home.

## 2022-10-13 ENCOUNTER — Encounter: Payer: Self-pay | Admitting: Family Medicine

## 2022-10-21 ENCOUNTER — Other Ambulatory Visit: Payer: Self-pay | Admitting: Family Medicine

## 2022-10-21 DIAGNOSIS — I63232 Cerebral infarction due to unspecified occlusion or stenosis of left carotid arteries: Secondary | ICD-10-CM

## 2022-10-21 MED ORDER — ROSUVASTATIN CALCIUM 40 MG PO TABS: 40.0000 mg | ORAL_TABLET | Freq: Every day | ORAL | 1 refills | Status: DC

## 2022-10-21 NOTE — Telephone Encounter (Signed)
Pt called to FU on previous message.  Please Advise.

## 2022-10-29 ENCOUNTER — Other Ambulatory Visit: Payer: Self-pay | Admitting: Family Medicine

## 2022-10-29 DIAGNOSIS — F419 Anxiety disorder, unspecified: Secondary | ICD-10-CM

## 2022-11-04 NOTE — Progress Notes (Unsigned)
ACUTE VISIT No chief complaint on file.  HPI: [name] is a 52 y.o. female, who is here today complaining of *** HPI  Review of Systems See other pertinent positives and negatives in HPI.  Current Outpatient Medications on File Prior to Visit  Medication Sig Dispense Refill   amLODipine (NORVASC) 5 MG tablet TAKE ONE TABLET BY MOUTH EVERY NIGHT AT BEDTIME 90 tablet 2   busPIRone (BUSPAR) 5 MG tablet TAKE ONE TABLET BY MOUTH EVERY MORNING AND TAKE TWO TABLETS BY MOUTH EVERY EVENING 90 tablet 3   clopidogrel (PLAVIX) 75 MG tablet TAKE ONE TABLET BY MOUTH DAILY 90 tablet 2   lisinopril (ZESTRIL) 20 MG tablet Take 1 tablet (20 mg total) by mouth daily. 90 tablet 1   Multiple Vitamins-Minerals (MULTIVITAMIN ADULTS) TABS Take 1 tablet by mouth daily.     oxybutynin (DITROPAN-XL) 5 MG 24 hr tablet Take 1 tablet (5 mg total) by mouth at bedtime. 90 tablet 1   rosuvastatin (CRESTOR) 40 MG tablet Take 1 tablet (40 mg total) by mouth daily. 90 tablet 1   No current facility-administered medications on file prior to visit.    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 w/ bilateral severe middle cerebral artery stenosis w/ a moyamoya like pattern, felt to be famlial   Hyperlipidemia    Hypertension    Iron deficiency anemia due to chronic blood loss    hx admission 07-24-2021 , transfused one unit blood and x2 Iron infusion   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   Occlusion and stenosis of bilateral carotid arteries    per angiogram done 02-08-2020  occlusion left ICA terminus & right ICA  50-60% stenosis   PONV (postoperative nausea and vomiting)    No Known Allergies  Social History   Socioeconomic History   Marital status: Single    Spouse name: Not on file   Number of children: 2   Years of education: Not on file   Highest education level: 12th grade  Occupational History    Comment: works at [employer]   Tobacco Use   Smoking status: Never   Smokeless tobacco: Never  Vaping Use   Vaping Use: Never used  Substance and Sexual Activity   Alcohol use: Not Currently   Drug use: Never   Sexual activity: Yes    Comment: AUB since 07-2021  Other Topics Concern   Not on file  Social History Narrative   [name] is a 52 year old patient who works full time at [employer] 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 She lives with and has support of her daughters (33 & 28). She is independent with care needs and denies issues with transportation to medical appointments    Right handed   Social Determinants of Health   Financial Resource Strain: Low Risk  (07/09/2022)   Overall Financial Resource Strain (CARDIA)    Difficulty of Paying Living Expenses: Not hard at all  Food Insecurity: No Food Insecurity (07/09/2022)   Hunger Vital Sign  Worried About Charity fundraiser in the Last Year: Never true    [place] in the Last Year: Never true  Transportation Needs: No Transportation Needs (07/09/2022)   PRAPARE - Hydrologist (Medical): No    Lack of Transportation (Non-Medical): No  Physical Activity: Insufficiently Active (07/09/2022)   Exercise Vital Sign    Days of Exercise per Week: 3 days    Minutes of Exercise per Session: 10 min  Stress: No Stress Concern Present (07/09/2022)   [place]    Feeling of Stress : Only a little  Social Connections: Socially Integrated (07/09/2022)   Social Connection and Isolation Panel [NHANES]     Frequency of Communication with Friends and Family: More than three times a week    Frequency of Social Gatherings with Friends and Family: Once a week    Attends Religious Services: More than 4 times per year    Active Member of [employer] or Organizations: Yes    Attends Music therapist: More than 4 times per year    Marital Status: Married    There were no vitals filed for this visit. There is no height or weight on file to calculate BMI.  Physical Exam  ASSESSMENT AND PLAN: There are no diagnoses linked to this encounter.  No follow-ups on file.  Deniese Oberry G. [name], MD  [facility]. [place] office.  Discharge Instructions   None

## 2022-11-05 ENCOUNTER — Encounter: Payer: Self-pay | Admitting: Family Medicine

## 2022-11-05 ENCOUNTER — Ambulatory Visit (INDEPENDENT_AMBULATORY_CARE_PROVIDER_SITE_OTHER)
Admission: RE | Admit: 2022-11-05 | Discharge: 2022-11-05 | Disposition: A | Discharge status: Home / Self Care | Payer: BC Managed Care – PPO | Source: Ambulatory Visit | Attending: Family Medicine | Admitting: Family Medicine

## 2022-11-05 ENCOUNTER — Ambulatory Visit: Payer: BC Managed Care – PPO | Admitting: Family Medicine

## 2022-11-05 VITALS — BP 126/80 | HR 82 | Temp 98.4°F | Resp 12 | Ht 66.0 in | Wt 157.5 lb

## 2022-11-05 DIAGNOSIS — M67432 Ganglion, left wrist: Secondary | ICD-10-CM | POA: Diagnosis not present

## 2022-11-05 DIAGNOSIS — M25532 Pain in left wrist: Secondary | ICD-10-CM

## 2022-11-05 NOTE — Patient Instructions (Signed)
A few things to remember from today's visit:  Left wrist pain - Plan: DG Wrist Complete Left  Ganglion of left wrist - Plan: DG Wrist Complete Left  If you need refills for medications you take chronically, please call your pharmacy. Do not use My Chart to request refills or for acute issues that need immediate attention. If you send a my chart message, it may take a few days to be addressed, specially if I am not in the office.  Please be sure medication list is accurate. If a new problem present, please set up appointment sooner than planned today.

## 2022-11-10 ENCOUNTER — Other Ambulatory Visit: Payer: Self-pay | Admitting: Family Medicine

## 2022-11-10 DIAGNOSIS — I10 Essential (primary) hypertension: Secondary | ICD-10-CM

## 2022-11-28 ENCOUNTER — Other Ambulatory Visit: Payer: Self-pay | Admitting: Family Medicine

## 2023-01-24 ENCOUNTER — Ambulatory Visit: Payer: BC Managed Care – PPO | Admitting: Family Medicine

## 2023-01-27 NOTE — Progress Notes (Unsigned)
HPI: [name] is a 52 y.o. female, who is here today for chronic disease management.  Last seen on 11/05/22 for wrist pain.  She is currently taking Amlodipine 5 mg daily and Lisinopril 20 mg daily for HTN management and adhering to a low salt diet. She has been monitoring her blood pressure at home, with a recent diastolic reading reported under 80.  Negative for unusual or severe headache, visual changes, exertional chest pain, dyspnea, focal weakness, or edema.  Lab Results  Component Value Date   CREATININE 0.92 07/10/2022   BUN 13 07/10/2022   NA 141 07/10/2022   K 3.9 07/10/2022   CL 105 07/10/2022   CO2 28 07/10/2022   CVA and HLD: She is also on Plavix 75 mg daily, Rosuvastatin 40 mg daily, and avoids fried food. She follows annually with Dr. Pearlean Brownie for moyamoya disease. She has history of multiple right ACA and left frontal MCA branch infarct with bilateral severe midline cerebral artery stenosis. Last visit with neurologist on 01/09/2022.  Lab Results  Component Value Date   CHOL 137 08/13/2022   HDL 43.40 08/13/2022   LDLCALC 73 08/13/2022   TRIG 105.0 08/13/2022   CHOLHDL 3 08/13/2022   She has a history of mildly elevated alkaline phosphatase, which is being monitored.  Negative for abdominal pain, nausea, or jaundice. Lab Results  Component Value Date   ALT 19 07/10/2022   AST 22 07/10/2022   ALKPHOS 125 (H) 07/10/2022   BILITOT 1.1 07/10/2022   For anxiety, she is taking Buspirone 5 mg twice daily and reports that it is working well.  Negative for depression like symptoms.  She had a mammogram and pap smear on 01/22/23 with a gynecologist.  She had an eye exam earlier this year and reports having a small cataract.  She is planning to start exercising at the [facility] and requires a letter stating that she is cleared for physical activity.   Review of Systems  Constitutional:  Negative for activity change, appetite change, chills and fever.   HENT:  Negative for mouth sores and sore throat.   Respiratory:  Negative for cough and wheezing.   Gastrointestinal:  Negative for vomiting.       Negative for changes in bowel habits.  Endocrine: Negative for cold intolerance and heat intolerance.  Genitourinary:  Negative for decreased urine volume, dysuria and hematuria.  Musculoskeletal:  Positive for arthralgias. Negative for gait problem.  Skin:  Negative for rash.  Neurological:  Negative for syncope and facial asymmetry.  Psychiatric/Behavioral:  Negative for confusion and hallucinations.   See other pertinent positives and negatives in HPI.  Current Outpatient Medications on File Prior to Visit  Medication Sig Dispense Refill   amLODipine (NORVASC) 5 MG tablet TAKE ONE TABLET BY MOUTH EVERY NIGHT AT BEDTIME 90 tablet 2   clopidogrel (PLAVIX) 75 MG tablet TAKE 1 TABLET BY MOUTH DAILY 90 tablet 2   Multiple Vitamins-Minerals (MULTIVITAMIN ADULTS) TABS Take 1 tablet by mouth daily.     oxybutynin (DITROPAN-XL) 5 MG 24 hr tablet Take 1 tablet (5 mg total) by mouth at bedtime. 90 tablet 1   rosuvastatin (CRESTOR) 40 MG tablet Take 1 tablet (40 mg total) by mouth daily. 90 tablet 1   No current facility-administered medications on file prior to visit.   Past Medical History:  Diagnosis Date   Abnormal uterine bleeding (AUB)    since 07-2021   Anticoagulant long-term use    plavix--- managed by  dr Pearlean Brownie (neurologist)   GAD (generalized anxiety disorder)    GERD (gastroesophageal reflux disease)    History of CVA (cerebrovascular accident) without residual deficits 02/05/2020   neurologist--- dr Pearlean Brownie;   admission in epic,  multiple right ACA & left frontal MCA branch infarcts w/ bilateral severe middle cerebral artery stenosis w/ a moyamoya like pattern, felt to be famlial   Hyperlipidemia    Hypertension    Iron deficiency anemia due to chronic blood loss    hx admission 07-24-2021 , transfused one unit blood and x2 Iron  infusion   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 Brownie   Occlusion and stenosis of bilateral carotid arteries    per angiogram done 02-08-2020  occlusion left ICA terminus & right ICA 50-60% stenosis   PONV (postoperative nausea and vomiting)    No Known Allergies  Social History   Socioeconomic History   Marital status: Single    Spouse name: Not on file   Number of children: 2   Years of education: Not on file   Highest education level: 12th grade  Occupational History    Comment: works at [employer]   Tobacco Use   Smoking status: Never   Smokeless tobacco: Never  Vaping Use   Vaping Use: Never used  Substance and Sexual Activity   Alcohol use: Not Currently   Drug use: Never   Sexual activity: Yes    Comment: AUB since 07-2021  Other Topics Concern   Not on file  Social History Narrative   [name] is a 52 year old patient who works full time at [employer] 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 She lives with and has support of her daughters (56 & 64). She is independent with care needs and denies issues with transportation to medical appointments    Right handed   Social Determinants of Health   Financial Resource Strain: Low Risk  (07/09/2022)   Overall Financial Resource Strain (CARDIA)    Difficulty of Paying Living Expenses: Not hard at all  Food Insecurity: No Food Insecurity (07/09/2022)   Hunger Vital Sign    Worried About Running Out of Food in the Last Year: Never true    Ran Out of Food in the Last Year: Never true  Transportation Needs: No Transportation Needs (07/09/2022)   PRAPARE - Administrator, Civil Service (Medical): No    Lack of Transportation (Non-Medical): No  Physical Activity: Insufficiently Active (07/09/2022)   Exercise Vital Sign    Days of Exercise per Week: 3 days    Minutes of Exercise per Session: 10 min  Stress: No Stress Concern Present (07/09/2022)   [employer] of Occupational Health -  Occupational Stress Questionnaire    Feeling of Stress : Only a little  Social Connections: Socially Integrated (07/09/2022)   Social Connection and Isolation Panel [NHANES]    Frequency of Communication with Friends and Family: More than three times a week    Frequency of Social Gatherings with Friends and Family: Once a week    Attends Religious Services: More than 4 times per year    Active Member of [employer] or Organizations: Yes    Attends Banker Meetings: More than 4 times per year    Marital Status: Married   Vitals:   01/28/23 0854  BP: 126/80  Pulse: 75  Resp: 12  Temp: 98.5 F (36.9 C)  SpO2: 99%   Body mass  index is 25.56 kg/m.  Physical Exam Vitals and nursing note reviewed.  Constitutional:      General: She is not in acute distress.    Appearance: She is well-developed.  HENT:     Head: Normocephalic and atraumatic.     Mouth/Throat:     Mouth: Mucous membranes are moist.     Pharynx: Oropharynx is clear.  Eyes:     Conjunctiva/sclera: Conjunctivae normal.  Cardiovascular:     Rate and Rhythm: Normal rate and regular rhythm.     Pulses:          Dorsalis pedis pulses are 2+ on the right side and 2+ on the left side.     Heart sounds: No murmur heard. Pulmonary:     Effort: Pulmonary effort is normal. No respiratory distress.     Breath sounds: Normal breath sounds.  Abdominal:     Palpations: Abdomen is soft. There is no hepatomegaly or mass.     Tenderness: There is no abdominal tenderness.  Lymphadenopathy:     Cervical: No cervical adenopathy.  Skin:    General: Skin is warm.     Findings: No erythema or rash.  Neurological:     General: No focal deficit present.     Mental Status: She is alert and oriented to person, place, and time.     Cranial Nerves: No cranial nerve deficit.     Gait: Gait normal.  Psychiatric:        Mood and Affect: Mood and affect normal.   ASSESSMENT AND PLAN:  Ms. Ariss was seen today for medical  management of chronic issues.  Diagnoses and all orders for this visit: Lab Results  Component Value Date   CHOL 104 01/28/2023   HDL 50.40 01/28/2023   LDLCALC 42 01/28/2023   TRIG 59.0 01/28/2023   CHOLHDL 2 01/28/2023   Lab Results  Component Value Date   CREATININE 0.87 01/28/2023   BUN 16 01/28/2023   NA 142 01/28/2023   K 4.1 01/28/2023   CL 105 01/28/2023   CO2 28 01/28/2023   Lab Results  Component Value Date   ALT 14 01/28/2023   AST 17 01/28/2023   ALKPHOS 109 01/28/2023   BILITOT 0.9 01/28/2023   Hyperlipidemia, unspecified hyperlipidemia type Assessment & Plan: Continue rosuvastatin 40 mg daily and low-fat diet. Further recommendation will be given according to lipid panel result.  Orders: -     Lipid panel; Future  Essential hypertension, benign Assessment & Plan: BP otherwise adequately controlled, reporting the BPs at home under 80. Continue amlodipine 5 mg daily and lisinopril 20 mg daily as well as low-salt diet. Continue monitoring BP regularly. She is due for eye exam. Follow-up in 6 months.  Orders: -     Basic metabolic panel; Future -     Lisinopril; Take 1 tablet (20 mg total) by mouth daily.  Dispense: 90 tablet; Refill: 1  Moya moya disease Assessment & Plan: Currently on Plavix 75 mg daily and rosuvastatin 40 mg daily. Last LDL was 73 in 06/2022. Following with neurologist. We will continue aggressive management of modifiable risk factors.  Orders: -     Lipid panel; Future  Anxiety disorder, unspecified type Assessment & Plan: She is reporting problem as well-controlled. Continue buspirone 5 mg a.m. and 10 mg p.m. Follow-up in 6 months.  Orders: -     busPIRone HCl; 1 tab am and 2 tabs pm.  Dispense: 180 tablet; Refill: 2  Elevated alkaline phosphatase  level Assessment & Plan: Mild and asymptomatic. We will continue following regularly. May consider liver US depending lab results.  Orders: -     Hepatic function  panel; Future -     Gamma GT; Future  Need for shingles vaccine -     Varicella-zoster vaccine IM  Return in about 6 months (around 07/31/2023) for CPE.  Miliani Deike G. [place], MD  [facility]. [place] office.

## 2023-01-28 ENCOUNTER — Encounter: Payer: Self-pay | Admitting: Family Medicine

## 2023-01-28 ENCOUNTER — Ambulatory Visit: Payer: BC Managed Care – PPO | Admitting: Family Medicine

## 2023-01-28 VITALS — BP 126/80 | HR 75 | Temp 98.5°F | Resp 12 | Ht 66.0 in | Wt 158.4 lb

## 2023-01-28 DIAGNOSIS — Z23 Encounter for immunization: Secondary | ICD-10-CM

## 2023-01-28 DIAGNOSIS — E785 Hyperlipidemia, unspecified: Secondary | ICD-10-CM

## 2023-01-28 DIAGNOSIS — R748 Abnormal levels of other serum enzymes: Secondary | ICD-10-CM | POA: Insufficient documentation

## 2023-01-28 DIAGNOSIS — F419 Anxiety disorder, unspecified: Secondary | ICD-10-CM | POA: Diagnosis not present

## 2023-01-28 DIAGNOSIS — I10 Essential (primary) hypertension: Secondary | ICD-10-CM | POA: Diagnosis not present

## 2023-01-28 DIAGNOSIS — I675 Moyamoya disease: Secondary | ICD-10-CM | POA: Diagnosis not present

## 2023-01-28 DIAGNOSIS — I63232 Cerebral infarction due to unspecified occlusion or stenosis of left carotid arteries: Secondary | ICD-10-CM

## 2023-01-28 LAB — HEPATIC FUNCTION PANEL
ALT: 14 U/L (ref 0–35)
AST: 17 U/L (ref 0–37)
Albumin: 4.4 g/dL (ref 3.5–5.2)
Alkaline Phosphatase: 109 U/L (ref 39–117)
Bilirubin, Direct: 0.2 mg/dL (ref 0.0–0.3)
Total Bilirubin: 0.9 mg/dL (ref 0.2–1.2)
Total Protein: 7.7 g/dL (ref 6.0–8.3)

## 2023-01-28 LAB — LIPID PANEL
Cholesterol: 104 mg/dL (ref 0–200)
HDL: 50.4 mg/dL (ref >39.00)
LDL Cholesterol: 42 mg/dL (ref 0–99)
NonHDL: 53.36
Total CHOL/HDL Ratio: 2
Triglycerides: 59 mg/dL (ref 0.0–149.0)
VLDL: 11.8 mg/dL (ref 0.0–40.0)

## 2023-01-28 LAB — BASIC METABOLIC PANEL
BUN: 16 mg/dL (ref 6–23)
CO2: 28 mEq/L (ref 19–32)
Calcium: 9.6 mg/dL (ref 8.4–10.5)
Chloride: 105 mEq/L (ref 96–112)
Creatinine, Ser: 0.87 mg/dL (ref 0.40–1.20)
GFR: 75.83 mL/min (ref >60.00)
Glucose, Bld: 96 mg/dL (ref 70–99)
Potassium: 4.1 mEq/L (ref 3.5–5.1)
Sodium: 142 mEq/L (ref 135–145)

## 2023-01-28 LAB — GAMMA GT: GGT: 23 U/L (ref 7–51)

## 2023-01-28 MED ORDER — ROSUVASTATIN CALCIUM 40 MG PO TABS: 40.0000 mg | ORAL_TABLET | Freq: Every day | ORAL | 2 refills | Status: DC

## 2023-01-28 MED ORDER — BUSPIRONE HCL 5 MG PO TABS: ORAL_TABLET | ORAL | 2 refills | Status: DC

## 2023-01-28 MED ORDER — LISINOPRIL 20 MG PO TABS: 20.0000 mg | ORAL_TABLET | Freq: Every day | ORAL | 1 refills | Status: DC

## 2023-01-28 NOTE — Assessment & Plan Note (Signed)
Mild and asymptomatic. We will continue following regularly. May consider liver US depending lab results.

## 2023-01-28 NOTE — Assessment & Plan Note (Signed)
Currently on Plavix 75 mg daily and rosuvastatin 40 mg daily. Last LDL was 73 in 06/2022. Following with neurologist. We will continue aggressive management of modifiable risk factors.

## 2023-01-28 NOTE — Assessment & Plan Note (Signed)
Continue rosuvastatin 40 mg daily and low-fat diet. Further recommendation will be given according to lipid panel result. 

## 2023-01-28 NOTE — Assessment & Plan Note (Signed)
She is reporting problem as well-controlled. Continue buspirone 5 mg a.m. and 10 mg p.m. Follow-up in 6 months.

## 2023-01-28 NOTE — Patient Instructions (Addendum)
A few things to remember from today's visit:  Essential hypertension, benign - Plan: Basic metabolic panel  Hyperlipidemia, unspecified hyperlipidemia type - Plan: Lipid panel  Moya moya disease - Plan: Lipid panel  Anxiety disorder, unspecified type  Elevated alkaline phosphatase level - Plan: Hepatic function panel, Gamma GT  No changes today. Shingrix vaccine given.  If you need refills for medications you take chronically, please call your pharmacy. Do not use My Chart to request refills or for acute issues that need immediate attention. If you send a my chart message, it may take a few days to be addressed, specially if I am not in the office.  Please be sure medication list is accurate. If a new problem present, please set up appointment sooner than planned today.

## 2023-01-28 NOTE — Assessment & Plan Note (Signed)
BP otherwise adequately controlled, reporting the BPs at home under 80. Continue amlodipine 5 mg daily and lisinopril 20 mg daily as well as low-salt diet. Continue monitoring BP regularly. She is due for eye exam. Follow-up in 6 months.

## 2023-04-05 ENCOUNTER — Other Ambulatory Visit: Payer: Self-pay | Admitting: Family Medicine

## 2023-04-05 DIAGNOSIS — N3941 Urge incontinence: Secondary | ICD-10-CM

## 2023-04-05 DIAGNOSIS — R350 Frequency of micturition: Secondary | ICD-10-CM

## 2023-04-29 NOTE — Progress Notes (Signed)
ACUTE VISIT Chief Complaint  Patient presents with   fluid on knee    Both knees, more on right side.    HPI: [name] is a 52 y.o. female with past medical history significant for hypertension, moyamoya disease, and hyperlipidemia here today complaining of  bilateral knee swelling, right greater than left, first noticed by a co-worker on Monday, 2-3 days ago. Denies redness, she has occasional pain. No recent injury or activities that could have caused this. Swelling is constant, not positional.  HTN on Amlodipine and Lisinopril. Denies CP,SOB, orthopnea, PND, or palpitations.  She has not tried OTC medications or LE elevation.  Review of Systems  Constitutional:  Negative for activity change, appetite change, chills and fever.  HENT:  Negative for mouth sores and sore throat.   Cardiovascular:  Negative for leg swelling.  Gastrointestinal:  Negative for abdominal pain, nausea and vomiting.  Genitourinary:  Negative for decreased urine volume, dysuria and hematuria.  Skin:  Negative for rash.  Neurological:  Negative for syncope and weakness.  See other pertinent positives and negatives in HPI.  Current Outpatient Medications on File Prior to Visit  Medication Sig Dispense Refill   amLODipine (NORVASC) 5 MG tablet TAKE ONE TABLET BY MOUTH EVERY NIGHT AT BEDTIME 90 tablet 2   busPIRone (BUSPAR) 5 MG tablet 1 tab am and 2 tabs pm. 180 tablet 2   clopidogrel (PLAVIX) 75 MG tablet TAKE 1 TABLET BY MOUTH DAILY 90 tablet 2   lisinopril (ZESTRIL) 20 MG tablet Take 1 tablet (20 mg total) by mouth daily. 90 tablet 1   Multiple Vitamins-Minerals (MULTIVITAMIN ADULTS) TABS Take 1 tablet by mouth daily.     oxybutynin (DITROPAN-XL) 5 MG 24 hr tablet TAKE 1 TABLET BY MOUTH AT BEDTIME 30 tablet 5   rosuvastatin (CRESTOR) 40 MG tablet Take 1 tablet (40 mg total) by mouth daily. 90 tablet 2   No current facility-administered medications on file prior to visit.   Past Medical  History:  Diagnosis Date   Abnormal uterine bleeding (AUB)    since 07-2021   Anticoagulant long-term use    plavix--- managed by dr Pearlean Brownie (neurologist)   GAD (generalized anxiety disorder)    GERD (gastroesophageal reflux disease)    History of CVA (cerebrovascular accident) without residual deficits 02/05/2020   neurologist--- dr Pearlean Brownie;   admission in epic,  multiple right ACA & left frontal MCA branch infarcts w/ bilateral severe middle cerebral artery stenosis w/ a moyamoya like pattern, felt to be famlial   Hyperlipidemia    Hypertension    Iron deficiency anemia due to chronic blood loss    hx admission 07-24-2021 , transfused one unit blood and x2 Iron infusion   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 Brownie   Occlusion and stenosis of bilateral carotid arteries    per angiogram done 02-08-2020  occlusion left ICA terminus & right ICA 50-60% stenosis   PONV (postoperative nausea and vomiting)    No Known Allergies  Social History   Socioeconomic History   Marital status: Single    Spouse name: Not on file   Number of children: 2   Years of education: Not on file   Highest education level: 12th grade  Occupational History    Comment: works at [employer]   Tobacco Use   Smoking status: Never   Smokeless tobacco: Never  Vaping Use   Vaping status: Never Used  Substance and Sexual Activity   Alcohol  use: Not Currently   Drug use: Never   Sexual activity: Yes    Comment: AUB since 07-2021  Other Topics Concern   Not on file  Social History Narrative   [name] is a 52 year old patient who works full time at [employer] 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 She lives with and has support of her daughters (56 & 45). She is independent with care needs and denies issues with transportation to medical appointments    Right handed   Social Determinants of Health   Financial Resource Strain: Low Risk  (07/09/2022)   Overall Financial Resource Strain  (CARDIA)    Difficulty of Paying Living Expenses: Not hard at all  Food Insecurity: No Food Insecurity (07/09/2022)   Hunger Vital Sign    Worried About Running Out of Food in the Last Year: Never true    Ran Out of Food in the Last Year: Never true  Transportation Needs: No Transportation Needs (07/09/2022)   PRAPARE - Administrator, Civil Service (Medical): No    Lack of Transportation (Non-Medical): No  Physical Activity: Insufficiently Active (07/09/2022)   Exercise Vital Sign    Days of Exercise per Week: 3 days    Minutes of Exercise per Session: 10 min  Stress: No Stress Concern Present (07/09/2022)   [employer] of Occupational Health - Occupational Stress Questionnaire    Feeling of Stress : Only a little  Social Connections: Socially Integrated (07/09/2022)   Social Connection and Isolation Panel [NHANES]    Frequency of Communication with Friends and Family: More than three times a week    Frequency of Social Gatherings with Friends and Family: Once a week    Attends Religious Services: More than 4 times per year    Active Member of [employer] or Organizations: Yes    Attends Banker Meetings: More than 4 times per year    Marital Status: Married   Vitals:   04/30/23 0949  BP: 118/80  Pulse: 67  Resp: 12  Temp: 98.5 F (36.9 C)  SpO2: 100%   Body mass index is 26.15 kg/m.  Physical Exam Vitals and nursing note reviewed.  Constitutional:      General: She is not in acute distress.    Appearance: She is well-developed. She is not ill-appearing.  HENT:     Head: Normocephalic and atraumatic.  Eyes:     Conjunctiva/sclera: Conjunctivae normal.  Cardiovascular:     Rate and Rhythm: Normal rate and regular rhythm.     Pulses:          Dorsalis pedis pulses are 2+ on the right side and 2+ on the left side.     Heart sounds: No murmur heard. Pulmonary:     Effort: Pulmonary effort is normal. No respiratory distress.  Musculoskeletal:         General: No edema.     Right knee: No crepitus. Normal range of motion. No tenderness.     Instability Tests: Anterior drawer test negative. Posterior drawer test negative.     Left knee: Crepitus present. Normal range of motion. No tenderness.     Instability Tests: Anterior drawer test negative. Posterior drawer test negative.     Comments: More noticeable while standing up,pronounce areas, soft lower medial and lateral areas. Lateral seems more defined, ? Cyst.  Skin:    General: Skin is warm.     Findings: No erythema or rash.  Neurological:     Mental Status: She is alert and oriented to person, place, and time.  Psychiatric:        Mood and Affect: Mood and affect, mood and affect normal.   ASSESSMENT AND PLAN: Effusion of right knee -     DG Knee Complete 4 Views Right; Future   Problem is affecting both knees, more noticeable on the right knee. History and examination do not suggest a serious process. We discussed differential diagnosis, including effusion, adipose tissue, and cyst (lower lateral). She would like to have a knee x-ray done. I reviewed imaging, no bone cyst, mild degenerative changes. Pending report. Monitor for new symptoms. We could consider a knee [place] if she notices growth.  Return if symptoms worsen or fail to improve, for keep next appointment.  Akhilesh Sassone G. [place], MD  [facility]. [place] office.

## 2023-04-30 ENCOUNTER — Ambulatory Visit: Payer: BC Managed Care – PPO | Admitting: Family Medicine

## 2023-04-30 ENCOUNTER — Ambulatory Visit (INDEPENDENT_AMBULATORY_CARE_PROVIDER_SITE_OTHER): Payer: BC Managed Care – PPO

## 2023-04-30 ENCOUNTER — Encounter: Payer: Self-pay | Admitting: Family Medicine

## 2023-04-30 VITALS — BP 118/80 | HR 67 | Temp 98.5°F | Resp 12 | Ht 66.0 in | Wt 162.0 lb

## 2023-04-30 DIAGNOSIS — M25461 Effusion, right knee: Secondary | ICD-10-CM

## 2023-04-30 NOTE — Patient Instructions (Signed)
A few things to remember from today's visit:  Effusion of right knee - Plan: DG Knee Complete 4 Views Right ? Ganglion.  If you need refills for medications you take chronically, please call your pharmacy. Do not use My Chart to request refills or for acute issues that need immediate attention. If you send a my chart message, it may take a few days to be addressed, specially if I am not in the office.  Please be sure medication list is accurate. If a new problem present, please set up appointment sooner than planned today.

## 2023-08-01 ENCOUNTER — Ambulatory Visit: Payer: BC Managed Care – PPO | Admitting: Family Medicine

## 2023-08-06 ENCOUNTER — Encounter: Payer: Self-pay | Admitting: Family Medicine

## 2023-08-06 ENCOUNTER — Ambulatory Visit: Payer: BC Managed Care – PPO | Admitting: Family Medicine

## 2023-08-06 ENCOUNTER — Other Ambulatory Visit: Payer: Self-pay | Admitting: Family Medicine

## 2023-08-06 VITALS — BP 124/80 | HR 79 | Temp 98.5°F | Resp 16 | Ht 66.0 in | Wt 162.4 lb

## 2023-08-06 DIAGNOSIS — M25461 Effusion, right knee: Secondary | ICD-10-CM | POA: Diagnosis not present

## 2023-08-06 DIAGNOSIS — I10 Essential (primary) hypertension: Secondary | ICD-10-CM | POA: Diagnosis not present

## 2023-08-06 DIAGNOSIS — R29898 Other symptoms and signs involving the musculoskeletal system: Secondary | ICD-10-CM | POA: Diagnosis not present

## 2023-08-06 DIAGNOSIS — Z23 Encounter for immunization: Secondary | ICD-10-CM

## 2023-08-06 MED ORDER — LISINOPRIL 20 MG PO TABS: 30.0000 mg | ORAL_TABLET | Freq: Every day | ORAL | Status: DC

## 2023-08-06 NOTE — Patient Instructions (Addendum)
A few things to remember from today's visit:  Essential hypertension, benign - Plan: lisinopril (ZESTRIL) 20 MG tablet  Right leg weakness - Plan: Ambulatory referral to Physical Therapy PT will be arranged. Lisinopril to increase to 30 mg. Blood pressure check am and pm and let me know about reading sin 2-3 weeks. Lab in 1-2 months.  If you need refills for medications you take chronically, please call your pharmacy. Do not use My Chart to request refills or for acute issues that need immediate attention. If you send a my chart message, it may take a few days to be addressed, specially if I am not in the office.  Please be sure medication list is accurate. If a new problem present, please set up appointment sooner than planned today.

## 2023-08-06 NOTE — Progress Notes (Signed)
HPI: [name] is a 52 y.o. female with a PMHx significant for HTN, OA, CVA, anxiety, moyamoya disease, arthralgia, and HLD, among some, who is here today for her chronic follow up.  Last seen on 04/30/2023. No new problems since her last visit.  Hypertension: Currently on amlodipine 5 mg daily and lisinopril 20 mg daily.  She says she has been checking at home and her readings are 120s/80s.  Side effects: none Negative for unusual or severe headache, visual changes, exertional chest pain, dyspnea, new focal weakness, or edema. She has an eye exam scheduled in December.   Lab Results  Component Value Date   CREATININE 0.87 01/28/2023   BUN 16 01/28/2023   NA 142 01/28/2023   K 4.1 01/28/2023   CL 105 01/28/2023   CO2 28 01/28/2023   CVA with RLE residual weakness. Currently on rosuvastatin 40 mg daily and Plavix 75 mg daily.   She follows with neurologist. Lab Results  Component Value Date   CHOL 104 01/28/2023   HDL 50.40 01/28/2023   LDLCALC 42 01/28/2023   TRIG 59.0 01/28/2023   CHOLHDL 2 01/28/2023   Anxiety:  She is taking Buspar 5 mg in the morning and 10 mg in the afternoon, which she believes is helping.   Last visit she was c/o right knee effusion, she says her knee is about the same as it was on her last visit. Negative for pain or erythema. Noted some limping, states that she has been limping some, worse when she first gets up to walk.  Negative fro back pain or skin rash.  She mentions that she failed two hearing tests provided by her job and was given hearing aids.   Review of Systems  Constitutional:  Negative for activity change, appetite change and fever.  HENT:  Negative for mouth sores, nosebleeds and trouble swallowing.   Respiratory:  Negative for cough and wheezing.   Gastrointestinal:  Negative for abdominal pain, nausea and vomiting.       Negative for changes in bowel habits.  Endocrine: Negative for cold intolerance and heat  intolerance.  Genitourinary:  Negative for decreased urine volume, dysuria and hematuria.  Neurological:  Negative for syncope and facial asymmetry.  Psychiatric/Behavioral:  Negative for confusion and hallucinations.   See other pertinent positives and negatives in HPI.  Current Outpatient Medications on File Prior to Visit  Medication Sig Dispense Refill   busPIRone (BUSPAR) 5 MG tablet 1 tab am and 2 tabs pm. 180 tablet 2   clopidogrel (PLAVIX) 75 MG tablet TAKE 1 TABLET BY MOUTH DAILY 90 tablet 2   Multiple Vitamins-Minerals (MULTIVITAMIN ADULTS) TABS Take 1 tablet by mouth daily.     oxybutynin (DITROPAN-XL) 5 MG 24 hr tablet TAKE 1 TABLET BY MOUTH AT BEDTIME 30 tablet 5   rosuvastatin (CRESTOR) 40 MG tablet Take 1 tablet (40 mg total) by mouth daily. 90 tablet 2   No current facility-administered medications on file prior to visit.    Past Medical History:  Diagnosis Date   Abnormal uterine bleeding (AUB)    since 07-2021   Anticoagulant long-term use    plavix--- managed by dr Pearlean Brownie (neurologist)   GAD (generalized anxiety disorder)    GERD (gastroesophageal reflux disease)    History of CVA (cerebrovascular accident) without residual deficits 02/05/2020   neurologist--- dr Pearlean Brownie;   admission in epic,  multiple right ACA & left frontal MCA branch infarcts w/ bilateral severe middle cerebral artery stenosis w/ a  moyamoya like pattern, felt to be famlial   Hyperlipidemia    Hypertension    Iron deficiency anemia due to chronic blood loss    hx admission 07-24-2021 , transfused one unit blood and x2 Iron infusion   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 Brownie   Occlusion and stenosis of bilateral carotid arteries    per angiogram done 02-08-2020  occlusion left ICA terminus & right ICA 50-60% stenosis   PONV (postoperative nausea and vomiting)    No Known Allergies  Social History   Socioeconomic History   Marital status: Married    Spouse name: Not on file   Number of  children: 2   Years of education: Not on file   Highest education level: 12th grade  Occupational History    Comment: works at [employer]   Tobacco Use   Smoking status: Never   Smokeless tobacco: Never  Vaping Use   Vaping status: Never Used  Substance and Sexual Activity   Alcohol use: Not Currently   Drug use: Never   Sexual activity: Yes    Comment: AUB since 07-2021  Other Topics Concern   Not on file  Social History Narrative   Jacqueline Medina is a 52 year old patient who works full time at [employer] 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 She lives with and has support of her daughters (10 & 6). She is independent with care needs and denies issues with transportation to medical appointments    Right handed   Social Determinants of Health   Financial Resource Strain: Low Risk  (07/09/2022)   Overall Financial Resource Strain (CARDIA)    Difficulty of Paying Living Expenses: Not hard at all  Food Insecurity: No Food Insecurity (07/09/2022)   Hunger Vital Sign    Worried About Running Out of Food in the Last Year: Never true    Ran Out of Food in the Last Year: Never true  Transportation Needs: No Transportation Needs (07/09/2022)   PRAPARE - Administrator, Civil Service (Medical): No    Lack of Transportation (Non-Medical): No  Physical Activity: Insufficiently Active (07/09/2022)   Exercise Vital Sign    Days of Exercise per Week: 3 days    Minutes of Exercise per Session: 10 min  Stress: No Stress Concern Present (07/09/2022)   [employer] of Occupational Health - Occupational Stress Questionnaire    Feeling of Stress : Only a little  Social Connections: Socially Integrated (07/09/2022)   Social Connection and Isolation Panel [NHANES]    Frequency of Communication with Friends and Family: More than three times a week    Frequency of Social Gatherings with Friends and Family: Once a week    Attends Religious Services: More than 4  times per year    Active Member of [employer] or Organizations: Yes    Attends Banker Meetings: More than 4 times per year    Marital Status: Married   Vitals:   08/06/23 0911  BP: 124/80  Pulse: 79  Resp: 16  Temp: 98.5 F (36.9 C)  SpO2: 99%   Body mass index is 26.21 kg/m.  Physical Exam Vitals and nursing note reviewed.  Constitutional:      General: She is not in acute distress.    Appearance: She is well-developed.  HENT:     Head: Normocephalic and atraumatic.     Mouth/Throat:     Mouth: Mucous membranes are moist.  Pharynx: Oropharynx is clear.  Eyes:     Conjunctiva/sclera: Conjunctivae normal.  Cardiovascular:     Rate and Rhythm: Normal rate and regular rhythm.     Pulses:          Dorsalis pedis pulses are 2+ on the right side and 2+ on the left side.     Heart sounds: No murmur heard. Pulmonary:     Effort: Pulmonary effort is normal. No respiratory distress.     Breath sounds: Normal breath sounds.  Abdominal:     Palpations: Abdomen is soft. There is no hepatomegaly or mass.     Tenderness: There is no abdominal tenderness.  Musculoskeletal:     Right knee: Effusion present. Normal range of motion.     Right lower leg: No edema.     Left lower leg: No edema.  Lymphadenopathy:     Cervical: No cervical adenopathy.  Skin:    General: Skin is warm.     Findings: No erythema or rash.  Neurological:     General: No focal deficit present.     Mental Status: She is alert and oriented to person, place, and time.     Cranial Nerves: No cranial nerve deficit.     Motor: No weakness.     Deep Tendon Reflexes:     Reflex Scores:      Patellar reflexes are 2+ on the right side and 2+ on the left side.    Comments: Mildly unstable gait. Tripped on right foot once. Mild limping.   Psychiatric:        Mood and Affect: Mood and affect normal.    ASSESSMENT AND PLAN:  Ms. Dermer was seen today for chronic follow up.   Orders Placed This  Encounter  Procedures   Zoster Recombinant (Shingrix )   Ambulatory referral to Physical Therapy   Right leg weakness Assessment & Plan: Residual after CVA, hx of moya moya disease. She agrees with trying PT. Instructed about warning signs. Fall precautions.  Orders: -     Ambulatory referral to Physical Therapy  Essential hypertension, benign Assessment & Plan: Reporting DBP's 80's at home. She agrees with increasing dose of Lisinopril from 20 mg to 30 mg daily. Continue Amlodipine 5 mg daily and low salt/DASH diet. Eye exam scheduled for 08/2023. She will let me know about BP's in 2-3 weeks. F/U in 6 months.  Orders: -     Lisinopril; Take 1.5 tablets (30 mg total) by mouth daily.  Need for shingles vaccine -     Varicella-zoster vaccine IM  Effusion of right knee Stable. Knee X ray on 05/03/23 showed mild medial compartment joint space narrowing.   Return in about 29 weeks (around 02/25/2024) for chronic problems.  I, Rolla Etienne Wierda, acting as a scribe for Jordanna Hendrie [place], MD., have documented all relevant documentation on the behalf of Rocko Fesperman [place], MD, as directed by  Irmalee Riemenschneider [place], MD while in the presence of Cena Bruhn [place], MD.   I, Dshaun Reppucci [place], MD, have reviewed all documentation for this visit. The documentation on 08/07/23 for the exam, diagnosis, procedures, and orders are all accurate and complete.  Donterius Filley G. [place], MD  [facility]. [place] office.

## 2023-08-07 NOTE — Assessment & Plan Note (Signed)
Reporting DBP's 80's at home. She agrees with increasing dose of Lisinopril from 20 mg to 30 mg daily. Continue Amlodipine 5 mg daily and low salt/DASH diet. Eye exam scheduled for 08/2023. She will let me know about BP's in 2-3 weeks. F/U in 6 months.

## 2023-08-07 NOTE — Assessment & Plan Note (Signed)
Residual after CVA, hx of moya moya disease. She agrees with trying PT. Instructed about warning signs. Fall precautions.

## 2023-08-19 ENCOUNTER — Telehealth: Payer: Self-pay | Admitting: Family Medicine

## 2023-08-19 MED ORDER — LISINOPRIL 30 MG PO TABS: 30.0000 mg | ORAL_TABLET | Freq: Every day | ORAL | 1 refills | Status: DC

## 2023-08-19 NOTE — Addendum Note (Signed)
Addended by: Kathreen Devoid on: 08/19/2023 04:18 PM   Modules accepted: Orders

## 2023-08-19 NOTE — Telephone Encounter (Signed)
I called and spoke with pt.   Her BP readings have been: 121/73 108/68 136/85 127/87 114/70 117/75 114/73 124/78 123/79 120/78  Lisinopril 30 mg is working well. Rx refilled for 90 days and sent to [facility] pharmacy per pt request. Changed Rx from 20 mg to 30 mg so she wouldn't have to cut the 20 mg tabs to make 30 mg.

## 2023-08-19 NOTE — Telephone Encounter (Signed)
Pt calling to report on how the new dosage of her lisinopril is doing

## 2023-08-20 ENCOUNTER — Other Ambulatory Visit: Payer: Self-pay | Admitting: Family Medicine

## 2023-08-20 DIAGNOSIS — I10 Essential (primary) hypertension: Secondary | ICD-10-CM

## 2023-08-25 ENCOUNTER — Other Ambulatory Visit: Payer: Self-pay | Admitting: Family Medicine

## 2023-09-01 ENCOUNTER — Other Ambulatory Visit: Payer: Self-pay | Admitting: Family Medicine

## 2023-09-01 DIAGNOSIS — I10 Essential (primary) hypertension: Secondary | ICD-10-CM

## 2023-09-29 ENCOUNTER — Other Ambulatory Visit: Payer: Self-pay | Admitting: Family Medicine

## 2023-09-29 DIAGNOSIS — N3941 Urge incontinence: Secondary | ICD-10-CM

## 2023-09-29 DIAGNOSIS — R350 Frequency of micturition: Secondary | ICD-10-CM

## 2023-10-02 ENCOUNTER — Other Ambulatory Visit: Payer: BC Managed Care – PPO

## 2023-10-02 ENCOUNTER — Telehealth: Payer: Self-pay | Admitting: Family Medicine

## 2023-10-02 NOTE — Telephone Encounter (Signed)
 Pt is requesting for labs to be done, she states that she believes her liver needs to be checked. Please advise.

## 2023-10-03 NOTE — Telephone Encounter (Signed)
 Last time blood work was done was 01/2023, including liver tests x 2, fasting lipid panel, and BMP. Everything was normal.  Lab Results  Component Value Date   ALT 14 01/28/2023   AST 17 01/28/2023   ALKPHOS 109 01/28/2023   BILITOT 0.9 01/28/2023  Last visit I recommend following in 02/2024, when I was planning on repeating blood work. Thanks, BJ

## 2023-10-15 ENCOUNTER — Other Ambulatory Visit: Payer: Self-pay | Admitting: Family Medicine

## 2023-10-15 ENCOUNTER — Telehealth: Payer: Self-pay | Admitting: Neurology

## 2023-10-15 DIAGNOSIS — F419 Anxiety disorder, unspecified: Secondary | ICD-10-CM

## 2023-10-15 NOTE — Telephone Encounter (Signed)
Pt returning call, states she was offered a same day earlier time due to provider leaving early. States she would like the 1/23 12pm slot

## 2023-10-16 ENCOUNTER — Ambulatory Visit: Payer: BC Managed Care – PPO | Admitting: Neurology

## 2023-10-16 VITALS — BP 142/86 | HR 74 | Ht 66.0 in | Wt 160.0 lb

## 2023-10-16 DIAGNOSIS — I675 Moyamoya disease: Secondary | ICD-10-CM | POA: Diagnosis not present

## 2023-10-16 DIAGNOSIS — Z8673 Personal history of transient ischemic attack (TIA), and cerebral infarction without residual deficits: Secondary | ICD-10-CM | POA: Diagnosis not present

## 2023-10-16 NOTE — Patient Instructions (Signed)
I had a long discussion with the patient regarding her remote strokes and moyamoya disease and answered questions.  She is doing very well and has been stroke free now for 5 years.  I recommend she continue Plavix 75 mg daily and maintain aggressive risk factor modification with strict control of hypertension with blood pressure goal below 130/90, lipids with LDL cholesterol goal below 70 mg percent and hemoglobin A1c goal below 6.5%.  Check screening lipid profile, hemoglobin A1c, carotid ultrasound and transcranial Doppler studies.  Return for follow-up in the future only if necessary and no scheduled appointment was made.

## 2023-10-16 NOTE — Progress Notes (Signed)
[facility] [address] [place]. [place] [address] [phone]       OFFICE FOLLOW UP VISITNOTE  [name] Date of Birth:  09/24/1970 Medical Record Number:  [id number]   Referring MD:  Marvel Plan  Reason for Referral: Stroke  HPI: Initial visit 03/21/2020;[name] is a pleasant 53 year old African-American lady seen today for initial office consultation visit.  She comes accompanied by her daughter.  History is obtained from them, review of electronic medical records and I personally reviewed imaging films in PACS.  She has no significant past medical history except hypertension hyperlipidemia and anxiety.  She presented on 02/05/2020 with weakness in the right hand with starting to drop objects and having numbness for a couple of days prior to presentation.  NIH stroke scale on admission was 0.  She had mild weakness of the right grip and hand muscles.  CT scan of the head was unremarkable but CT angiogram showed left supraclinoid ICA occlusion with reconstituted collateral supply the left anterior middle cerebral territories.  The distal right ICA was abnormally small but was patent and supplying the right MCA.  There was occlusion to severe stenosis of the right anterior cerebral artery.  MRI scan of the brain showed multiple small acute infarcts involving right ACA and left MCA territory in the precentral gyrus.  There was also a subacute small left frontal infarct noted.  There are extensive changes of chronic small vessel disease.  Patient underwent diagnostic cerebral catheter angiogram by Dr. Corliss Skains on 02/08/2020 which confirmed occluded left ICA terminus and 50 to 60% stenosis of the right ICA M1 segment and both posterior cerebral arteries were prominent.  2D echo showed normal ejection fraction without cardiac source embolism.  LDL cholesterol was marginally elevated at 107 mg percent and hemoglobin A1c was 5.5.  HIV was negative.  ESR was normal C-reactive  protein was elevated at 3.7.  ANA, antineutrophilic cytoplasmic antibodies, antidsDNA, Sjogren's antibodies and rheumatoid factor were all negative.  HIV was negative.  Patient underwent alpha galactosidase testing which came low at 25.7 within normal limits being greater than 35.  The patient however lacks other clinical symptoms of Fabry's disease and denies painful acroparesthesias, skin lesions or kidney problems.  She does not have any cataracts or vision problems either.  Interestingly the patient has a sister who is older than her Jacqueline Medina dob 01/02/62) who also had significant occlusive intracranial disease and Dr. Corliss Skains did angioplasty stenting in 2013.  Patient states she is done well since discharge.  She was placed on aspirin and Plavix which is tolerating well with only minor bruising and no bleeding episodes.  She has no recurrence of TIA or stroke symptoms.  She states her blood pressures well controlled today it is 127/83.  She is tolerating Lipitor 80 mg well without any significant muscle aches or joint pains.  She works at [employer].  Return back to baseline.  No complaints or deficits. Update 06/15/2020: She returns for follow-up after last visit 3 months ago.  She is accompanied by her husband.  She is doing well.  She is had no recurrent TIA or stroke symptoms.  She has stopped aspirin and is now on Plavix alone which is tolerating well without bruising or bleeding.  She had lab work done at last visit and LDL cholesterol was 86 and improved from previous.  She had follow-up CT angiogram ordered by Dr. Corliss Skains 06/01/2020 which showed unchanged occlusion of the distal left ICA,  proximal left MCA and proximal ACAs with distal reconstitution via collaterals.  There is moderate to severe stenosis of the right ICA terminus possibly mildly progressed compared to prior CTA.  Dr. Corliss Skains recommended conservative medical treatment for now.  Patient states blood pressures well controlled  today it is 110/72.  She remains on Lipitor which is tolerating well without muscle aches and pains.  She has no new complaints. Update 12/14/2020 : She returns for follow-up after last visit 6 months ago.  She is accompanied by her daughter.  She states she is continues to do well and has not had any recurrent TIA or stroke symptoms.  She is tolerating Plavix well without bruising or bleeding.  She had the lab work done on 08/15/2020 and LDL cholesterol was optimal at 59 mg percent.  She remains on Lipitor 80 mg daily which is tolerating well without muscle aches and pains.  Blood pressure is quite well controlled on Zestril and Lopressor and today it is 110/78.  She is pretty active.  She has no complaints.  She continues to have no kidney problems or skin lesions or any painful neuropathy symptoms. Prior virtual video visit 01/09/2022 : Patient is seen today after last visit on 12/14/2020.  She states she continues to do well.  She has had no recurrent TIA or stroke symptoms.  She is tolerating Plavix well without bruising or bleeding.  She remains on Lipitor she is tolerating well without muscle aches and pains.  Her blood pressure is is now well controlled her primary care physician had to add lisinopril to her Lopressor and Norvasc.  She states she is active.  She has had no new interval health problems.  She had lab work on 09/10/2021 which showed satisfactory LDL cholesterol of 56 mg percent and hemoglobin A1c of 4.8. She had no new complaints today.  Update 10/16/2023 : She returns for follow-up today after last visit more than a year and a half ago.  She continues to do well.  She has had no recurrent TIA or stroke symptoms now since 2019.  She remains on Plavix which is tolerating well with only minor bruising and no bleeding episodes.  She had trouble tolerating Lipitor due to muscle aches and was switched to Crestor which she is tolerating much better.  Last lipid profile on 01/28/2023 showed  LDL-cholesterol to be optimal at 42 mg percent.  She states her blood pressure is usually much better controlled but today it is elevated in office and she blames it due to the stress of driving.  She has not had any new health problems and has no complaints today. ROS:   14 system review of systems is positive for no complaints todayand all other systems negative  PMH:  Past Medical History:  Diagnosis Date   Abnormal uterine bleeding (AUB)    since 07-2021   Anticoagulant long-term use    plavix--- managed by dr Pearlean Brownie (neurologist)   GAD (generalized anxiety disorder)    GERD (gastroesophageal reflux disease)    History of CVA (cerebrovascular accident) without residual deficits 02/05/2020   neurologist--- dr Pearlean Brownie;   admission in epic,  multiple right ACA & left frontal MCA branch infarcts w/ bilateral severe middle cerebral artery stenosis w/ a moyamoya like pattern, felt to be famlial   Hyperlipidemia    Hypertension    Iron deficiency anemia due to chronic blood loss    hx admission 07-24-2021 , transfused one unit blood and x2 Iron infusion  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 Brownie   Occlusion and stenosis of bilateral carotid arteries    per angiogram done 02-08-2020  occlusion left ICA terminus & right ICA 50-60% stenosis   PONV (postoperative nausea and vomiting)     Social History:  Social History   Socioeconomic History   Marital status: Married    Spouse name: Not on file   Number of children: 2   Years of education: Not on file   Highest education level: 12th grade  Occupational History    Comment: works at [employer]   Tobacco Use   Smoking status: Never   Smokeless tobacco: Never  Vaping Use   Vaping status: Never Used  Substance and Sexual Activity   Alcohol use: Not Currently   Drug use: Never   Sexual activity: Yes    Comment: AUB since 07-2021  Other Topics Concern   Not on file  Social History Narrative   [name] is a 53 year old  patient who works full time at [employer] 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 She lives with and has support of her daughters (22 & 70). She is independent with care needs and denies issues with transportation to medical appointments    Right handed   Social Drivers of Health   Financial Resource Strain: Low Risk  (07/09/2022)   Overall Financial Resource Strain (CARDIA)    Difficulty of Paying Living Expenses: Not hard at all  Food Insecurity: No Food Insecurity (07/09/2022)   Hunger Vital Sign    Worried About Running Out of Food in the Last Year: Never true    Ran Out of Food in the Last Year: Never true  Transportation Needs: No Transportation Needs (07/09/2022)   PRAPARE - Administrator, Civil Service (Medical): No    Lack of Transportation (Non-Medical): No  Physical Activity: Insufficiently Active (07/09/2022)   Exercise Vital Sign    Days of Exercise per Week: 3 days    Minutes of Exercise per Session: 10 min  Stress: No Stress Concern Present (07/09/2022)   [employer] of Occupational Health - Occupational Stress Questionnaire    Feeling of Stress : Only a little  Social Connections: Socially Integrated (07/09/2022)   Social Connection and Isolation Panel [NHANES]    Frequency of Communication with Friends and Family: More than three times a week    Frequency of Social Gatherings with Friends and Family: Once a week    Attends Religious Services: More than 4 times per year    Active Member of [employer] or Organizations: Yes    Attends Engineer, structural: More than 4 times per year    Marital Status: Married  Catering manager Violence: Not on file    Medications:   Current Outpatient Medications on File Prior to Visit  Medication Sig Dispense Refill   amLODipine (NORVASC) 5 MG tablet TAKE 1 TABLET BY MOUTH EVERY NIGHT AT BEDTIME 90 tablet 3   busPIRone (BUSPAR) 5 MG tablet TAKE 1 TABLET BY MOUTH EVERY MORNING AND TAKE TWO TABLETS BY  MOUTH EVERY EVENING 90 tablet 2   clopidogrel (PLAVIX) 75 MG tablet TAKE 1 TABLET BY MOUTH DAILY 90 tablet 2   lisinopril (ZESTRIL) 30 MG tablet Take 1 tablet (30 mg total) by mouth daily. 90 tablet 1   Multiple Vitamins-Minerals (MULTIVITAMIN ADULTS) TABS Take 1 tablet by mouth daily.     oxybutynin (DITROPAN-XL) 5 MG 24 hr  tablet TAKE 1 TABLET BY MOUTH AT BEDTIME 30 tablet 5   rosuvastatin (CRESTOR) 40 MG tablet Take 1 tablet (40 mg total) by mouth daily. 90 tablet 2   No current facility-administered medications on file prior to visit.    Allergies:  No Known Allergies  Physical Exam General: well developed, well nourished middle-aged African-American lady, seated, in no evident distress Head: head normocephalic and atraumatic.   Neck: supple with no carotid or supraclavicular bruits Cardiovascular: regular rate and rhythm, no murmurs Musculoskeletal: no deformity Skin:  no rash/petichiae Vascular:  Normal pulses all extremities  Neurologic Exam Mental Status: Awake and fully alert. Oriented to place and time. Recent and remote memory intact. Attention span, concentration and fund of knowledge appropriate. Mood and affect appropriate.  Cranial Nerves: Fundoscopic exam not done. Pupils equal, briskly reactive to light. Extraocular movements full without nystagmus. Visual fields full to confrontation. Hearing intact. Facial sensation intact. Face, tongue, palate moves normally and symmetrically.  Motor: Normal bulk and tone. Normal strength in all tested extremity muscles. Sensory.: intact to touch , pinprick , position and vibratory sensation.  Coordination: Rapid alternating movements normal in all extremities. Finger-to-nose and heel-to-shin performed accurately bilaterally. Gait and Station: Arises from chair without difficulty. Stance is normal. Gait demonstrates normal stride length and balance . Able to heel, toe and tandem walk without difficulty.  Reflexes: 1+ and symmetric.  Toes downgoing.       ASSESSMENT: 53 year old African-American lady with multiple right ACA and left frontal MCA branch infarcts with bilateral severe middle cerebral artery stenosis with a moyamoya-like pattern.  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  She lacks skin lesions, acroparesthesias and others clinical symptoms of Fabry's.  Fabry's typically affects small vessels and not large vessels in the brain also.  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 Corliss Skains.  She remained stable from neurological standpoint and follow-up CT angiogram on 06/01/2020 shows no major progression     PLAN: I had a long discussion with the patient regarding her remote strokes and moyamoya disease and answered questions.  She is doing very well and has been stroke free now for 5 years.  I recommend she continue Plavix 75 mg daily and maintain aggressive risk factor modification with strict control of hypertension with blood pressure goal below 130/90, lipids with LDL cholesterol goal below 70 mg percent and hemoglobin A1c goal below 6.5%.  Check screening lipid profile, hemoglobin A1c, carotid ultrasound and transcranial Doppler studies.  Return for follow-up in the future only if necessary and no scheduled appointment was made. Greater than 50% time during this 35 minute visit was performed on counseling and coordination of care about her strokes and discussion about moyamoya and Fabry's disease . Delia Heady, MD  [facility] [address] [place], [place] [address]  Phone [phone] Fax [phone] Note: This document was prepared with digital dictation and possible smart phrase technology. Any transcriptional errors that result from this process  are unintentional.

## 2023-10-17 LAB — LIPID PANEL
Chol/HDL Ratio: 2.9 {ratio} (ref 0.0–4.4)
Cholesterol, Total: 129 mg/dL (ref 100–199)
HDL: 45 mg/dL (ref >39)
LDL Chol Calc (NIH): 64 mg/dL (ref 0–99)
Triglycerides: 107 mg/dL (ref 0–149)
VLDL Cholesterol Cal: 20 mg/dL (ref 5–40)

## 2023-10-17 LAB — HEMOGLOBIN A1C
Est. average glucose Bld gHb Est-mCnc: 108 mg/dL
Hgb A1c MFr Bld: 5.4 % (ref 4.8–5.6)

## 2023-10-22 NOTE — Progress Notes (Signed)
Kindly inform the patient that cholesterol profile and screening test for diabetes were both satisfactory

## 2023-10-29 ENCOUNTER — Ambulatory Visit (HOSPITAL_COMMUNITY)
Admission: RE | Admit: 2023-10-29 | Discharge: 2023-10-29 | Disposition: A | Discharge status: Home / Self Care | Payer: BC Managed Care – PPO | Source: Ambulatory Visit | Attending: Neurology | Admitting: Neurology

## 2023-10-29 DIAGNOSIS — I675 Moyamoya disease: Secondary | ICD-10-CM | POA: Insufficient documentation

## 2023-10-29 NOTE — Progress Notes (Signed)
 Transcranial Doppler  Date POD PCO2 HCT BP  MCA ACA PCA OPHT SIPH VERT Basilar  2/5 GC     Right  Left   112  84   -33  -18   33  92   7  32   19  28   -54  -37   -55           Right  Left                                            Right  Left                                             Right  Left                                             Right  Left                                            Right  Left                                            Right  Left                                        MCA = Middle Cerebral Artery      OPHT = Opthalmic Artery     BASILAR = Basilar Artery   ACA = Anterior Cerebral Artery     SIPH = Carotid Siphon PCA = Posterior Cerebral Artery   VERT = Verterbral Artery                   Normal MCA = 62+\-12 ACA = 50+\-12 PCA = 42+\-23   Carotid artery duplex completed.  10/29/23 10:02 AM Cathlyn Collet RVT

## 2023-10-30 ENCOUNTER — Ambulatory Visit: Payer: BC Managed Care – PPO | Admitting: Neurology

## 2023-11-07 ENCOUNTER — Encounter: Payer: Self-pay | Admitting: Neurology

## 2023-11-08 ENCOUNTER — Other Ambulatory Visit: Payer: Self-pay | Admitting: Family Medicine

## 2023-11-08 DIAGNOSIS — I63232 Cerebral infarction due to unspecified occlusion or stenosis of left carotid arteries: Secondary | ICD-10-CM

## 2024-01-07 ENCOUNTER — Other Ambulatory Visit: Payer: Self-pay | Admitting: Family Medicine

## 2024-01-07 DIAGNOSIS — F419 Anxiety disorder, unspecified: Secondary | ICD-10-CM

## 2024-02-12 ENCOUNTER — Other Ambulatory Visit: Payer: Self-pay | Admitting: Family Medicine

## 2024-03-05 ENCOUNTER — Ambulatory Visit: Payer: BC Managed Care – PPO | Admitting: Family Medicine

## 2024-03-05 NOTE — Progress Notes (Signed)
 HPI: [name] is a 53 y.o. female with a PMHx significant for HTN, OA, CVA, anxiety, moyamoya disease, and HLD, among some, who is here today for chronic disease management.  Last seen on 08/06/2023.  No longer following with neurology or vascular surgery.   Hypertension:  Currently on amlodipine  5 mg daily and lisinopril  30 mg daily.  BP readings at home: She checks her BP regularly at home and says it is normally under 130/80.  Diet: She tries to avoid salt in her diet.  Exercise: She does exercises some, leg stretching nightly.  Vision: UTD on routine vision care.   Negative for unusual or severe headache, visual changes, exertional chest pain, dyspnea,  focal weakness, or edema.  Lab Results  Component Value Date   CREATININE 0.87 01/28/2023   BUN 16 01/28/2023   NA 142 01/28/2023   K 4.1 01/28/2023   CL 105 01/28/2023   CO2 28 01/28/2023   Hyperlipidemia: Currently on rosuvastatin  40 mg daily.   Lab Results  Component Value Date   CHOL 129 10/16/2023   HDL 45 10/16/2023   LDLCALC 64 10/16/2023   TRIG 107 10/16/2023   CHOLHDL 2.9 10/16/2023   Moyamoya disease/CVA;  Residual RLE weakness, independent ADL's. She does not smoke or drink.  Currently on Plavix  75 mg daily.   Anxiety:  Currently on Buspar  5 mg in the morning and 10 mg at night. She reports symptoms as well controlled.  Review of Systems  Constitutional:  Negative for activity change, appetite change and fever.  HENT:  Negative for sore throat.   Respiratory:  Negative for cough and wheezing.   Gastrointestinal:  Negative for abdominal pain, nausea and vomiting.  Genitourinary:  Negative for decreased urine volume, dysuria and hematuria.  Skin:  Negative for rash.  Neurological:  Negative for syncope and facial asymmetry.  See other pertinent positives and negatives in HPI.  Current Outpatient Medications on File Prior to Visit  Medication Sig Dispense Refill   amLODipine  (NORVASC ) 5 MG  tablet TAKE 1 TABLET BY MOUTH EVERY NIGHT AT BEDTIME 90 tablet 3   busPIRone  (BUSPAR ) 5 MG tablet TAKE 1 TABLET BY MOUTH EVERY MORNING AND TAKE TWO TABLETS BY MOUTH EVERY EVENING 90 tablet 5   clopidogrel  (PLAVIX ) 75 MG tablet TAKE 1 TABLET BY MOUTH DAILY 90 tablet 2   lisinopril  (ZESTRIL ) 30 MG tablet TAKE 1 TABLET BY MOUTH DAILY 30 tablet 0   Multiple Vitamins-Minerals (MULTIVITAMIN ADULTS) TABS Take 1 tablet by mouth daily.     oxybutynin  (DITROPAN -XL) 5 MG 24 hr tablet TAKE 1 TABLET BY MOUTH AT BEDTIME 30 tablet 5   rosuvastatin  (CRESTOR ) 40 MG tablet TAKE 1 TABLET BY MOUTH DAILY 90 tablet 1   No current facility-administered medications on file prior to visit.   Past Medical History:  Diagnosis Date   Abnormal uterine bleeding (AUB)    since 07-2021   Anticoagulant long-term use    plavix --- managed by dr Jacqueline Medina (neurologist)   GAD (generalized anxiety disorder)    GERD (gastroesophageal reflux disease)    History of CVA (cerebrovascular accident) without residual deficits 02/05/2020   neurologist--- dr Jacqueline Medina;   admission in epic,  multiple right ACA & left frontal MCA branch infarcts w/ bilateral severe middle cerebral artery stenosis w/ a moyamoya like pattern, felt to be famlial   Hyperlipidemia    Hypertension    Iron deficiency anemia due to chronic blood loss    hx admission 07-24-2021 , transfused  one unit blood and x2 Iron infusion   Moya moya disease    followed by dr Jacqueline Medina   Occlusion and stenosis of bilateral carotid arteries    per angiogram done 02-08-2020  occlusion left ICA terminus & right ICA 50-60% stenosis   PONV (postoperative nausea and vomiting)    No Known Allergies  Social History   Socioeconomic History   Marital status: Married    Spouse name: Not on file   Number of children: 2   Years of education: Not on file   Highest education level: 12th grade  Occupational History    Comment: works at [employer]   Tobacco Use   Smoking status: Never    Smokeless tobacco: Never  Vaping Use   Vaping status: Never Used  Substance and Sexual Activity   Alcohol use: Not Currently   Drug use: Never   Sexual activity: Yes    Comment: AUB since 07-2021  Other Topics Concern   Not on file  Social History Narrative   Jacqueline Medina is a 53 year old patient who works full time at [employer] 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 She lives with and has support of her daughters (33 & 81). She is independent with care needs and denies issues with transportation to medical appointments    Right handed   Social Drivers of Health   Financial Resource Strain: Low Risk  (07/09/2022)   Overall Financial Resource Strain (CARDIA)    Difficulty of Paying Living Expenses: Not hard at all  Food Insecurity: No Food Insecurity (07/09/2022)   Hunger Vital Sign    Worried About Running Out of Food in the Last Year: Never true    Ran Out of Food in the Last Year: Never true  Transportation Needs: No Transportation Needs (07/09/2022)   PRAPARE - Administrator, Civil Service (Medical): No    Lack of Transportation (Non-Medical): No  Physical Activity: Insufficiently Active (07/09/2022)   Exercise Vital Sign    Days of Exercise per Week: 3 days    Minutes of Exercise per Session: 10 min  Stress: No Stress Concern Present (07/09/2022)   [employer] of Occupational Health - Occupational Stress Questionnaire    Feeling of Stress : Only a little  Social Connections: Socially Integrated (07/09/2022)   Social Connection and Isolation Panel    Frequency of Communication with Friends and Family: More than three times a week    Frequency of Social Gatherings with Friends and Family: Once a week    Attends Religious Services: More than 4 times per year    Active Member of [employer] or Organizations: Yes    Attends Banker Meetings: More than 4 times per year    Marital Status: Married   Vitals:   03/08/24 0758  BP: 102/60   Pulse: 70  Resp: 12  Temp: 98.5 F (36.9 C)  SpO2: 99%   Body mass index is 26.23 kg/m.  Physical Exam Vitals and nursing note reviewed.  Constitutional:      General: She is not in acute distress.    Appearance: She is well-developed.  HENT:     Head: Normocephalic and atraumatic.     Mouth/Throat:     Mouth: Mucous membranes are moist.     Pharynx: Oropharynx is clear. Uvula midline.   Eyes:     Conjunctiva/sclera: Conjunctivae normal.    Cardiovascular:     Rate and Rhythm: Normal rate and  regular rhythm.     Pulses:          Dorsalis pedis pulses are 2+ on the right side and 2+ on the left side.     Heart sounds: No murmur heard. Pulmonary:     Effort: Pulmonary effort is normal. No respiratory distress.     Breath sounds: Normal breath sounds.  Abdominal:     Palpations: Abdomen is soft. There is no hepatomegaly or mass.     Tenderness: There is no abdominal tenderness.   Musculoskeletal:     Right lower leg: No edema.     Left lower leg: No edema.   Skin:    General: Skin is warm.     Findings: No erythema or rash.   Neurological:     General: No focal deficit present.     Mental Status: She is alert and oriented to person, place, and time.     Cranial Nerves: No cranial nerve deficit.     Gait: Gait normal.   Psychiatric:        Mood and Affect: Mood and affect normal.   ASSESSMENT AND PLAN:  [name] was seen today for chronic disease management.   Orders Placed This Encounter  Procedures   Comprehensive metabolic panel with GFR   Lab Results  Component Value Date   NA 143 03/08/2024   CL 107 03/08/2024   K 4.3 03/08/2024   CO2 29 03/08/2024   BUN 21 03/08/2024   CREATININE 0.82 03/08/2024   GFR 80.78 03/08/2024   CALCIUM  9.6 03/08/2024   PHOS 2.5 02/08/2020   ALBUMIN 4.5 03/08/2024   GLUCOSE 78 03/08/2024   Lab Results  Component Value Date   ALT 17 03/08/2024   AST 16 03/08/2024   ALKPHOS 135 (H) 03/08/2024   BILITOT 0.8  03/08/2024   Hyperlipidemia, unspecified hyperlipidemia type Assessment & Plan: LDL at goal, 64 in 09/2023. Continue rosuvastatin  40 mg daily and low-fat diet. We will plan on fasting lipid panel next visit.   Essential hypertension, benign Assessment & Plan: BP adequately controlled. Continue amlodipine  5 mg daily and lisinopril  30 mg daily as well as low-salt diet. Eye exam is current. Continue monitoring BP regularly. Follow-up in 6 months.  Orders: -     Comprehensive metabolic panel with GFR; Future  Anxiety disorder, unspecified type Assessment & Plan: Problem is reported as well-controlled. Continue BuSpar  5 mg in the morning and 10 mg at night.   Return in about 6 months (around 09/07/2024) for CPE, Labs, chronic problems.  I, Fritz Jewel Wierda, acting as a scribe for Lenor Provencher [place], MD., have documented all relevant documentation on the behalf of Cadin Luka [place], MD, as directed by  Valisa Karpel [place], MD while in the presence of Jhalil Silvera [place], MD.   I, Raye Slyter [place], MD, have reviewed all documentation for this visit. The documentation on 03/08/24 for the exam, diagnosis, procedures, and orders are all accurate and complete.  Aeneas Longsworth G. [place], MD  [facility]. [place] office.

## 2024-03-07 ENCOUNTER — Other Ambulatory Visit: Payer: Self-pay | Admitting: Family Medicine

## 2024-03-08 ENCOUNTER — Ambulatory Visit: Payer: Self-pay | Admitting: Family Medicine

## 2024-03-08 ENCOUNTER — Encounter: Payer: Self-pay | Admitting: Family Medicine

## 2024-03-08 ENCOUNTER — Ambulatory Visit: Payer: BC Managed Care – PPO | Admitting: Family Medicine

## 2024-03-08 ENCOUNTER — Ambulatory Visit: Admitting: Family Medicine

## 2024-03-08 VITALS — BP 102/60 | HR 70 | Temp 98.5°F | Resp 12 | Ht 66.0 in | Wt 162.5 lb

## 2024-03-08 DIAGNOSIS — E785 Hyperlipidemia, unspecified: Secondary | ICD-10-CM | POA: Diagnosis not present

## 2024-03-08 DIAGNOSIS — F419 Anxiety disorder, unspecified: Secondary | ICD-10-CM

## 2024-03-08 DIAGNOSIS — I10 Essential (primary) hypertension: Secondary | ICD-10-CM | POA: Diagnosis not present

## 2024-03-08 LAB — COMPREHENSIVE METABOLIC PANEL WITH GFR
ALT: 17 U/L (ref 0–35)
AST: 16 U/L (ref 0–37)
Albumin: 4.5 g/dL (ref 3.5–5.2)
Alkaline Phosphatase: 135 U/L — ABNORMAL HIGH (ref 39–117)
BUN: 21 mg/dL (ref 6–23)
CO2: 29 meq/L (ref 19–32)
Calcium: 9.6 mg/dL (ref 8.4–10.5)
Chloride: 107 meq/L (ref 96–112)
Creatinine, Ser: 0.82 mg/dL (ref 0.40–1.20)
GFR: 80.78 mL/min (ref >60.00)
Glucose, Bld: 78 mg/dL (ref 70–99)
Potassium: 4.3 meq/L (ref 3.5–5.1)
Sodium: 143 meq/L (ref 135–145)
Total Bilirubin: 0.8 mg/dL (ref 0.2–1.2)
Total Protein: 7.3 g/dL (ref 6.0–8.3)

## 2024-03-08 MED ORDER — ROSUVASTATIN CALCIUM 40 MG PO TABS: 40.0000 mg | ORAL_TABLET | Freq: Every day | ORAL | 2 refills | Status: DC

## 2024-03-08 NOTE — Assessment & Plan Note (Signed)
 Problem is reported as well-controlled. Continue BuSpar  5 mg in the morning and 10 mg at night.

## 2024-03-08 NOTE — Patient Instructions (Addendum)
 A few things to remember from today's visit:  Essential hypertension, benign  Anxiety disorder, unspecified type No changes today.  If you need refills for medications you take chronically, please call your pharmacy. Do not use My Chart to request refills or for acute issues that need immediate attention. If you send a my chart message, it may take a few days to be addressed, specially if I am not in the office.  Please be sure medication list is accurate. If a new problem present, please set up appointment sooner than planned today.

## 2024-03-08 NOTE — Assessment & Plan Note (Signed)
 LDL at goal, 64 in 09/2023. Continue rosuvastatin  40 mg daily and low-fat diet. We will plan on fasting lipid panel next visit.

## 2024-03-08 NOTE — Assessment & Plan Note (Signed)
 BP adequately controlled. Continue amlodipine  5 mg daily and lisinopril  30 mg daily as well as low-salt diet. Eye exam is current. Continue monitoring BP regularly. Follow-up in 6 months.

## 2024-03-19 ENCOUNTER — Encounter (HOSPITAL_COMMUNITY): Payer: Self-pay | Admitting: Interventional Radiology

## 2024-03-25 ENCOUNTER — Other Ambulatory Visit: Payer: Self-pay | Admitting: Family Medicine

## 2024-03-25 DIAGNOSIS — R350 Frequency of micturition: Secondary | ICD-10-CM

## 2024-03-25 DIAGNOSIS — N3941 Urge incontinence: Secondary | ICD-10-CM

## 2024-05-17 ENCOUNTER — Other Ambulatory Visit: Payer: Self-pay | Admitting: Family Medicine

## 2024-06-04 ENCOUNTER — Encounter: Payer: Self-pay | Admitting: Family Medicine

## 2024-06-04 ENCOUNTER — Ambulatory Visit: Admitting: Family Medicine

## 2024-06-04 VITALS — BP 110/70 | HR 74 | Temp 97.9°F | Resp 12 | Ht 66.0 in | Wt 164.6 lb

## 2024-06-04 DIAGNOSIS — I10 Essential (primary) hypertension: Secondary | ICD-10-CM | POA: Diagnosis not present

## 2024-06-04 DIAGNOSIS — M25562 Pain in left knee: Secondary | ICD-10-CM | POA: Diagnosis not present

## 2024-06-04 MED ORDER — CELECOXIB 100 MG PO CAPS: 100.0000 mg | ORAL_CAPSULE | Freq: Two times a day (BID) | ORAL | 0 refills | Status: DC

## 2024-06-04 NOTE — Patient Instructions (Addendum)
 A few things to remember from today's visit:  Acute pain of left knee - Plan: celecoxib  (CELEBREX ) 100 MG capsule, Ambulatory referral to Sports Medicine  Tendinitis vs osteoarthritis. Celebrex  for 10 days. After we can try topical voltaren.  If you need refills for medications you take chronically, please call your pharmacy. Do not use My Chart to request refills or for acute issues that need immediate attention. If you send a my chart message, it may take a few days to be addressed, specially if I am not in the office.  Please be sure medication list is accurate. If a new problem present, please set up appointment sooner than planned today.

## 2024-06-04 NOTE — Assessment & Plan Note (Signed)
 BP adequately controlled. Currently on amlodipine  5 mg daily and lisinopril  30 mg daily. We discussed some side effects of NSAIDs, instructed to monitor BP more frequent while taking Celebrex .

## 2024-06-04 NOTE — Progress Notes (Signed)
 ACUTE VISIT Chief Complaint  Patient presents with   Knee Pain    Pt is accompanied by husband. Pt reports L knee pain for over a week. Think maybe due to working on cement floor.    Discussed the use of AI scribe software for clinical note transcription with the patient, who gave verbal consent to proceed.  History of Present Illness Jacqueline Medina is a 53 year old female with past medical history significant for CVA, hypertension, and anxiety; who is here today complaining of anterior left knee pain as described above.  She has been experiencing left knee pain for over a week, primarily located at the anterior aspect of the knee. The pain is described as sharp and reaches a severity of 10 out of 10 when at work, but subsides when not working/rest.  Prolonged walking on cement at her job seems to aggravate the pain.   No recent trauma or increased physical activity such as squatting or stair climbing. There is some swelling in the knee but no redness.  She manages the pain with Tylenol  and uses a knee brace at work.   In the past she has been seen for bilateral knee swelling, she has not undergone physical therapy for this issue. Right knee x-ray 05/03/23: Mild medial compartment joint space narrowing.   She works at [employer] and is currently working every day, which involves prolonged walking on cement. No recent trauma, increased physical activity, redness, and no history of ulcers or gastrointestinal bleeding.  Hypertension on amlodipine  5 mg daily and lisinopril  30 mg daily. Negative for CP, dyspnea, palpitation, or edema. Lab Results  Component Value Date   NA 143 03/08/2024   CL 107 03/08/2024   K 4.3 03/08/2024   CO2 29 03/08/2024   BUN 21 03/08/2024   CREATININE 0.82 03/08/2024   GFR 80.78 03/08/2024   CALCIUM  9.6 03/08/2024   PHOS 2.5 02/08/2020   ALBUMIN 4.5 03/08/2024   GLUCOSE 78 03/08/2024   Review of Systems  Constitutional:  Negative for appetite  change, chills and fever.  HENT:  Negative for mouth sores and sore throat.   Respiratory:  Negative for cough and shortness of breath.   Cardiovascular:  Negative for palpitations and leg swelling.  Gastrointestinal:  Negative for abdominal pain, nausea and vomiting.  Genitourinary:  Negative for decreased urine volume, dysuria and hematuria.  Skin:  Negative for rash.  Neurological:  Negative for syncope, weakness, numbness and headaches.  See other pertinent positives and negatives in HPI.  Current Outpatient Medications on File Prior to Visit  Medication Sig Dispense Refill   amLODipine  (NORVASC ) 5 MG tablet TAKE 1 TABLET BY MOUTH EVERY NIGHT AT BEDTIME 90 tablet 3   busPIRone  (BUSPAR ) 5 MG tablet TAKE 1 TABLET BY MOUTH EVERY MORNING AND TAKE TWO TABLETS BY MOUTH EVERY EVENING 90 tablet 5   clopidogrel  (PLAVIX ) 75 MG tablet TAKE 1 TABLET BY MOUTH DAILY 90 tablet 1   lisinopril  (ZESTRIL ) 30 MG tablet TAKE 1 TABLET BY MOUTH DAILY 90 tablet 3   Multiple Vitamins-Minerals (MULTIVITAMIN ADULTS) TABS Take 1 tablet by mouth daily.     oxybutynin  (DITROPAN -XL) 5 MG 24 hr tablet TAKE 1 TABLET BY MOUTH AT BEDTIME 90 tablet 3   rosuvastatin  (CRESTOR ) 40 MG tablet Take 1 tablet (40 mg total) by mouth daily. 90 tablet 2   No current facility-administered medications on file prior to visit.   Past Medical History:  Diagnosis Date   Abnormal uterine bleeding (  AUB)    since 07-2021   Anticoagulant long-term use    plavix --- managed by dr rosemarie (neurologist)   GAD (generalized anxiety disorder)    GERD (gastroesophageal reflux disease)    History of CVA (cerebrovascular accident) without residual deficits 02/05/2020   neurologist--- dr rosemarie;   admission in epic,  multiple right ACA & left frontal MCA branch infarcts w/ bilateral severe middle cerebral artery stenosis w/ a moyamoya like pattern, felt to be famlial   Hyperlipidemia    Hypertension    Iron deficiency anemia due to chronic blood  loss    hx admission 07-24-2021 , transfused one unit blood and x2 Iron infusion   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   Occlusion and stenosis of bilateral carotid arteries    per angiogram done 02-08-2020  occlusion left ICA terminus & right ICA 50-60% stenosis   PONV (postoperative nausea and vomiting)    No Known Allergies  Social History   Socioeconomic History   Marital status: Married    Spouse name: Not on file   Number of children: 2   Years of education: Not on file   Highest education level: 12th grade  Occupational History    Comment: works at [employer]   Tobacco Use   Smoking status: Never   Smokeless tobacco: Never  Vaping Use   Vaping status: Never Used  Substance and Sexual Activity   Alcohol use: Not Currently   Drug use: Never   Sexual activity: Yes    Comment: AUB since 07-2021  Other Topics Concern   Not on file  Social History Narrative   [name] is a 53 year old patient who works full time at [employer] 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 She lives with and has support of her daughters (41 & 78). She is independent with care needs and denies issues with transportation to medical appointments    Right handed   Social Drivers of Health   Financial Resource Strain: Low Risk  (07/09/2022)   Overall Financial Resource Strain (CARDIA)    Difficulty of Paying Living Expenses: Not hard at all  Food Insecurity: No Food Insecurity (07/09/2022)   Hunger Vital Sign    Worried About Running Out of Food in the Last Year: Never true    Ran Out of Food in the Last Year: Never true  Transportation Needs: No Transportation Needs (07/09/2022)   PRAPARE - Administrator, Civil Service (Medical): No    Lack of Transportation (Non-Medical): No  Physical Activity: Insufficiently Active (07/09/2022)   Exercise Vital Sign    Days of Exercise per Week: 3 days    Minutes of Exercise per Session: 10 min  Stress: No Stress  Concern Present (07/09/2022)   [employer] of Occupational Health - Occupational Stress Questionnaire    Feeling of Stress : Only a little  Social Connections: Socially Integrated (07/09/2022)   Social Connection and Isolation Panel    Frequency of Communication with Friends and Family: More than three times a week    Frequency of Social Gatherings with Friends and Family: Once a week    Attends Religious Services: More than 4 times per year    Active Member of [employer] or Organizations: Yes    Attends Banker Meetings: More than 4 times per year    Marital Status: Married   Vitals:   06/04/24 0800  BP: 110/70  Pulse: 74  Resp: 12  Temp: 97.9 F (36.6 C)  SpO2: 98%   Body mass index is 26.57 kg/m.  Physical Exam Vitals and nursing note reviewed.  Constitutional:      General: She is not in acute distress.    Appearance: She is well-developed. She is not ill-appearing.  HENT:     Head: Normocephalic and atraumatic.  Eyes:     Conjunctiva/sclera: Conjunctivae normal.  Cardiovascular:     Rate and Rhythm: Normal rate and regular rhythm.     Pulses:          Dorsalis pedis pulses are 2+ on the left side.     Heart sounds: No murmur heard. Pulmonary:     Effort: Pulmonary effort is normal. No respiratory distress.     Breath sounds: Normal breath sounds.  Musculoskeletal:     Left knee: Crepitus present. No effusion, erythema or bony tenderness. Decreased range of motion (mildly limitation of flull flexion, elicits pain). Tenderness present over the patellar tendon. No LCL laxity or MCL laxity.Normal alignment. Normal pulse.     Instability Tests: Anterior drawer test negative. Posterior drawer test negative.     Right lower leg: No edema.     Left lower leg: No edema.       Legs:  Skin:    General: Skin is warm.     Findings: No erythema or rash.  Neurological:     Mental Status: She is alert and oriented to person, place, and time.  Psychiatric:         Mood and Affect: Mood and affect normal.   ASSESSMENT AND PLAN:  Ms Garlitz was seen today for left knee pain.  Acute pain of left knee We discussed possible etiologies,?  Patellar tendinitis vs OA. I do not think imaging is needed at this time. In regard to pain management, we discussed options, she agrees with short course of Celebrex .  We discussed some side effects. Local ice. We could continue with Voltaren gel 4 times daily and acetaminophen  500 mg 3-4 times per day after completing course of Celebrex . We discussed options including PT, she would like referral to sports medicine.  -     Celecoxib ; Take 1 capsule (100 mg total) by mouth 2 (two) times daily for 10 days.  Dispense: 20 capsule; Refill: 0 -     Ambulatory referral to Sports Medicine  Essential hypertension, benign Assessment & Plan: BP adequately controlled. Currently on amlodipine  5 mg daily and lisinopril  30 mg daily. We discussed some side effects of NSAIDs, instructed to monitor BP more frequent while taking Celebrex .  Return if symptoms worsen or fail to improve, for keep next appointment.  Lorretta Kerce G. [place], MD  [facility]. [place] office.

## 2024-06-11 ENCOUNTER — Other Ambulatory Visit: Payer: Self-pay | Admitting: Family Medicine

## 2024-06-11 DIAGNOSIS — M25562 Pain in left knee: Secondary | ICD-10-CM

## 2024-07-01 ENCOUNTER — Other Ambulatory Visit: Payer: Self-pay | Admitting: Family Medicine

## 2024-07-01 DIAGNOSIS — M25562 Pain in left knee: Secondary | ICD-10-CM

## 2024-07-01 DIAGNOSIS — F419 Anxiety disorder, unspecified: Secondary | ICD-10-CM

## 2024-08-26 ENCOUNTER — Other Ambulatory Visit: Payer: Self-pay | Admitting: Family Medicine

## 2024-08-26 DIAGNOSIS — I10 Essential (primary) hypertension: Secondary | ICD-10-CM

## 2024-09-07 ENCOUNTER — Ambulatory Visit: Payer: Self-pay | Admitting: Family Medicine

## 2024-09-07 ENCOUNTER — Ambulatory Visit: Admitting: Family Medicine

## 2024-09-07 ENCOUNTER — Encounter: Payer: Self-pay | Admitting: Family Medicine

## 2024-09-07 VITALS — BP 128/86 | HR 69 | Temp 98.1°F | Resp 16 | Ht 66.0 in | Wt 168.0 lb

## 2024-09-07 DIAGNOSIS — R945 Abnormal results of liver function studies: Secondary | ICD-10-CM | POA: Diagnosis not present

## 2024-09-07 DIAGNOSIS — E785 Hyperlipidemia, unspecified: Secondary | ICD-10-CM

## 2024-09-07 DIAGNOSIS — F419 Anxiety disorder, unspecified: Secondary | ICD-10-CM

## 2024-09-07 DIAGNOSIS — I10 Essential (primary) hypertension: Secondary | ICD-10-CM

## 2024-09-07 DIAGNOSIS — N3941 Urge incontinence: Secondary | ICD-10-CM

## 2024-09-07 DIAGNOSIS — R748 Abnormal levels of other serum enzymes: Secondary | ICD-10-CM

## 2024-09-07 DIAGNOSIS — Z23 Encounter for immunization: Secondary | ICD-10-CM

## 2024-09-07 LAB — BASIC METABOLIC PANEL WITH GFR
BUN: 18 mg/dL (ref 6–23)
CO2: 29 meq/L (ref 19–32)
Calcium: 9.7 mg/dL (ref 8.4–10.5)
Chloride: 105 meq/L (ref 96–112)
Creatinine, Ser: 0.86 mg/dL (ref 0.40–1.20)
GFR: 76.03 mL/min (ref >60.00)
Glucose, Bld: 97 mg/dL (ref 70–99)
Potassium: 4.1 meq/L (ref 3.5–5.1)
Sodium: 142 meq/L (ref 135–145)

## 2024-09-07 LAB — HEPATIC FUNCTION PANEL
ALT: 17 U/L (ref 0–35)
AST: 19 U/L (ref 5–37)
Albumin: 4.6 g/dL (ref 3.5–5.2)
Alkaline Phosphatase: 135 U/L — ABNORMAL HIGH (ref 39–117)
Bilirubin, Direct: 0.1 mg/dL (ref 0.0–0.3)
Total Bilirubin: 0.8 mg/dL (ref 0.2–1.2)
Total Protein: 7.6 g/dL (ref 6.0–8.3)

## 2024-09-07 LAB — LIPID PANEL
Cholesterol: 125 mg/dL (ref 28–200)
HDL: 44.5 mg/dL (ref >39.00)
LDL Cholesterol: 61 mg/dL (ref 0–99)
NonHDL: 80.73
Total CHOL/HDL Ratio: 3
Triglycerides: 97 mg/dL (ref 0.0–149.0)
VLDL: 19.4 mg/dL (ref 0.0–40.0)

## 2024-09-07 MED ORDER — LISINOPRIL 40 MG PO TABS: 40.0000 mg | ORAL_TABLET | Freq: Every day | ORAL | 2 refills | Status: AC

## 2024-09-07 MED ORDER — OXYBUTYNIN CHLORIDE ER 10 MG PO TB24: 10.0000 mg | ORAL_TABLET | Freq: Every day | ORAL | 1 refills | Status: AC

## 2024-09-07 NOTE — Assessment & Plan Note (Signed)
 DBP mildly elevated today. She reported elevated SBPs at home. Recommend increasing dose of lisinopril  from 30 mg to 40 mg daily. Continue amlodipine  5 mg daily and low-salt diet. Continue monitoring BP regularly. Eye exam is current, she has an appointment scheduled. Follow-up in 5 to 6 months.

## 2024-09-07 NOTE — Assessment & Plan Note (Signed)
 Mild. Possible etiologies discussed. Further recommendation will be given according to lab results.

## 2024-09-07 NOTE — Assessment & Plan Note (Signed)
 Reports problem as well-controlled with current medication. Continue BuSpar  5 mg in the morning and 10 mg at night.

## 2024-09-07 NOTE — Assessment & Plan Note (Signed)
 Last LDL 64 in 09/2023. Continue rosuvastatin  40 mg daily. Further recommendation will be given according to lipid panel results.

## 2024-09-07 NOTE — Assessment & Plan Note (Signed)
 Problem has improved but still having occasional episodes of urine urgency and incontinence. Recommend increasing dose of oxybutynin  ER from 5 mg to 10 mg. Kegel/pelvic floor exercises will also help. Avoid constipation. We discussed some side effects of medication.

## 2024-09-07 NOTE — Progress Notes (Addendum)
 "  Chief Complaint  Patient presents with   Follow-up    Patient states nothing to discuss currently. States everything is going pretty good.    Discussed the use of AI scribe software for clinical note transcription with the patient, who gave verbal consent to proceed.  History of Present Illness Jacqueline Medina is a 53 year old female with past medical history significant for CVA, hypertension, and anxiety here today for chronic disease management. Last seen on 06/04/2024. Since her last visit she has been following with orthopedics for knee pain. She uses Voltaren cream effectively. She engages in physical activity, including stretching and using stairs, and uses a stick for support without experiencing pain during these activities.  Hypertension: Currently she is on amlodipine  5 mg daily and lisinopril  30 mg daily. Her blood pressure is generally well-controlled at home, with occasional elevations of SBP's. She is currently taking amlodipine  5 mg daily and lisinopril  30 mg daily.  Negative for unusual or severe headache, visual changes, exertional chest pain, dyspnea,  focal weakness, or edema.  Lab Results  Component Value Date   CREATININE 0.82 03/08/2024   BUN 21 03/08/2024   NA 143 03/08/2024   K 4.3 03/08/2024   CL 107 03/08/2024   CO2 29 03/08/2024   Hyperlipidemia: She is on rosuvastatin  40 mg daily. CVA: Currently on Plavix  75 mg daily. She has residual right leg weakness, described as 'a little bit' of weakness.  It does not interfere with ADLs.  Lab Results  Component Value Date   CHOL 129 10/16/2023   HDL 45 10/16/2023   LDLCALC 64 10/16/2023   TRIG 107 10/16/2023   CHOLHDL 2.9 10/16/2023   Alkaline phosphatase has been mildly elevated in the past. Negative for abdominal pain, nausea, vomiting, or jaundice.  Lab Results  Component Value Date   ALT 17 03/08/2024   AST 16 03/08/2024   ALKPHOS 135 (H) 03/08/2024   BILITOT 0.8 03/08/2024   Urinary frequency  on oxybutynin  ER 5 mg daily symptoms have improved but he still having some urgency and episodes of incontinence. Negative for dysuria, gross hematuria, or decreased urine output.  Anxiety on buspirone  5 mg a.m. and 10 mg p.m. No feelings of worthlessness or hopelessness are reported. She feels 'okay' with this regimen.  No side effects reported.  Lab Results  Component Value Date   WBC 5.0 05/24/2022   HGB 13.2 05/24/2022   HCT 38.8 05/24/2022   MCV 93.6 05/24/2022   PLT 194.0 05/24/2022   Review of Systems  Constitutional:  Negative for activity change, appetite change, chills and fever.  HENT:  Negative for sore throat and trouble swallowing.   Respiratory:  Negative for cough and wheezing.   Endocrine: Negative for cold intolerance and heat intolerance.  Skin:  Negative for rash.  Neurological:  Negative for syncope and facial asymmetry.  Psychiatric/Behavioral:  Negative for confusion and hallucinations.   See other pertinent positives and negatives in HPI.  Medications Ordered Prior to Encounter[1]  Past Medical History:  Diagnosis Date   Abnormal uterine bleeding (AUB)    since 07-2021   Anticoagulant long-term use    plavix --- managed by dr rosemarie (neurologist)   GAD (generalized anxiety disorder)    GERD (gastroesophageal reflux disease)    History of CVA (cerebrovascular accident) without residual deficits 02/05/2020   neurologist--- dr rosemarie;   admission in epic,  multiple right ACA & left frontal MCA branch infarcts w/ bilateral severe middle cerebral artery stenosis w/  a moyamoya like pattern, felt to be famlial   Hyperlipidemia    Hypertension    Iron deficiency anemia due to chronic blood loss    hx admission 07-24-2021 , transfused one unit blood and x2 Iron infusion   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   Occlusion and stenosis of bilateral carotid arteries    per angiogram done 02-08-2020  occlusion left ICA terminus & right ICA 50-60% stenosis    PONV (postoperative nausea and vomiting)     Allergies[2]  Social History   Socioeconomic History   Marital status: Married    Spouse name: Not on file   Number of children: 2   Years of education: Not on file   Highest education level: 12th grade  Occupational History    Comment: works at [employer]   Tobacco Use   Smoking status: Never   Smokeless tobacco: Never  Vaping Use   Vaping status: Never Used  Substance and Sexual Activity   Alcohol use: Not Currently   Drug use: Never   Sexual activity: Yes    Comment: AUB since 07-2021  Other Topics Concern   Not on file  Social History Narrative   Jacqueline Medina is a 52 year old patient who works full time at [employer] 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 She lives with and has support of her daughters (80 & 23). She is independent with care needs and denies issues with transportation to medical appointments    Right handed   Social Drivers of Health   Tobacco Use: Low Risk (09/07/2024)   Patient History    Smoking Tobacco Use: Never    Smokeless Tobacco Use: Never    Passive Exposure: Not on file  Financial Resource Strain: Low Risk (09/03/2024)   Overall Financial Resource Strain (CARDIA)    Difficulty of Paying Living Expenses: Not hard at all  Food Insecurity: No Food Insecurity (09/03/2024)   Epic    Worried About Radiation Protection Practitioner of Food in the Last Year: Never true    Ran Out of Food in the Last Year: Never true  Transportation Needs: No Transportation Needs (09/03/2024)   Epic    Lack of Transportation (Medical): No    Lack of Transportation (Non-Medical): No  Physical Activity: Insufficiently Active (09/03/2024)   Exercise Vital Sign    Days of Exercise per Week: 3 days    Minutes of Exercise per Session: 10 min  Stress: No Stress Concern Present (09/03/2024)   [employer] of Occupational Health - Occupational Stress Questionnaire    Feeling of Stress: Not at all  Social Connections:  Socially Integrated (09/03/2024)   Social Connection and Isolation Panel    Frequency of Communication with Friends and Family: More than three times a week    Frequency of Social Gatherings with Friends and Family: More than three times a week    Attends Religious Services: More than 4 times per year    Active Member of Clubs or Organizations: Yes    Attends Banker Meetings: More than 4 times per year    Marital Status: Married  Depression (PHQ2-9): Low Risk (09/07/2024)   Depression (PHQ2-9)    PHQ-2 Score: 0  Alcohol Screen: Not on file  Housing: Low Risk (09/03/2024)   Epic    Unable to Pay for Housing in the Last Year: No    Number of Times Moved in the Last Year: 0  Homeless in the Last Year: No  Utilities: Not on file  Health Literacy: Not on file    Today's Vitals   09/07/24 0759  BP: 128/86  Pulse: 69  Resp: 16  Temp: 98.1 F (36.7 C)  TempSrc: Oral  SpO2: 100%  Weight: 168 lb (76.2 kg)  Height: 5' 6 (1.676 m)   Wt Readings from Last 3 Encounters:  09/07/24 168 lb (76.2 kg)  06/04/24 164 lb 9.6 oz (74.7 kg)  03/08/24 162 lb 8 oz (73.7 kg)   Body mass index is 27.12 kg/m.  Physical Exam Vitals and nursing note reviewed.  Constitutional:      General: She is not in acute distress.    Appearance: She is well-developed.  HENT:     Head: Normocephalic and atraumatic.     Mouth/Throat:     Mouth: Mucous membranes are moist.     Pharynx: Oropharynx is clear.  Eyes:     Conjunctiva/sclera: Conjunctivae normal.  Cardiovascular:     Rate and Rhythm: Normal rate and regular rhythm.     Pulses:          Dorsalis pedis pulses are 2+ on the right side and 2+ on the left side.     Heart sounds: No murmur heard. Pulmonary:     Effort: Pulmonary effort is normal. No respiratory distress.     Breath sounds: Normal breath sounds.  Abdominal:     Palpations: Abdomen is soft. There is no hepatomegaly or mass.     Tenderness: There is no abdominal  tenderness.  Lymphadenopathy:     Cervical: No cervical adenopathy.  Skin:    General: Skin is warm.     Findings: No erythema or rash.  Neurological:     General: No focal deficit present.     Mental Status: She is alert and oriented to person, place, and time.     Cranial Nerves: No cranial nerve deficit.     Gait: Gait normal.  Psychiatric:        Mood and Affect: Mood and affect normal.   ASSESSMENT AND PLAN:  Jacqueline Medina was seen today for follow-up.  Diagnoses and all orders for this visit: Orders Placed This Encounter  Procedures   Flu vaccine trivalent PF, 6mos and older(Flulaval,Afluria,Fluarix,Fluzone)   Pneumococcal conjugate vaccine 20-valent (Prevnar 20)   Hepatitis B surface antibody,qualitative   Hepatitis B surface antigen   Basic metabolic panel with GFR   Hepatic function panel   Lipid panel   Lab Results  Component Value Date   CHOL 125 09/07/2024   HDL 44.50 09/07/2024   LDLCALC 61 09/07/2024   TRIG 97.0 09/07/2024   CHOLHDL 3 09/07/2024   Lab Results  Component Value Date   NA 142 09/07/2024   CL 105 09/07/2024   K 4.1 09/07/2024   CO2 29 09/07/2024   BUN 18 09/07/2024   CREATININE 0.86 09/07/2024   GFR 76.03 09/07/2024   CALCIUM  9.7 09/07/2024   PHOS 2.5 02/08/2020   ALBUMIN 4.6 09/07/2024   GLUCOSE 97 09/07/2024   Lab Results  Component Value Date   ALT 17 09/07/2024   AST 19 09/07/2024   ALKPHOS 135 (H) 09/07/2024   BILITOT 0.8 09/07/2024   Urinary incontinence, urge Assessment & Plan: Problem has improved but still having occasional episodes of urine urgency and incontinence. Recommend increasing dose of oxybutynin  ER from 5 mg to 10 mg. Kegel/pelvic floor exercises will also help. Avoid constipation. We discussed some side effects  of medication.  Orders: -     oxyBUTYnin  Chloride ER; Take 1 tablet (10 mg total) by mouth at bedtime.  Dispense: 90 tablet; Refill: 1  Essential hypertension, benign Assessment &  Plan: DBP mildly elevated today. She reported elevated SBPs at home. Recommend increasing dose of lisinopril  from 30 mg to 40 mg daily. Continue amlodipine  5 mg daily and low-salt diet. Continue monitoring BP regularly. Eye exam is current, she has an appointment scheduled. Follow-up in 5 to 6 months.  Orders: -     Basic metabolic panel with GFR; Future -     Lisinopril ; Take 1 tablet (40 mg total) by mouth daily.  Dispense: 90 tablet; Refill: 2  Hyperlipidemia, unspecified hyperlipidemia type Assessment & Plan: Last LDL 64 in 09/2023. Continue rosuvastatin  40 mg daily. Further recommendation will be given according to lipid panel results.  Orders: -     Hepatic function panel; Future -     Lipid panel; Future  Elevated alkaline phosphatase level Assessment & Plan: Mild. Possible etiologies discussed. Further recommendation will be given according to lab results.  Orders: -     Hepatitis B surface antibody,qualitative; Future -     Hepatitis B surface antigen; Future -     Hepatic function panel; Future  Anxiety disorder, unspecified type Assessment & Plan: Reports problem as well-controlled with current medication. Continue BuSpar  5 mg in the morning and 10 mg at night.   Immunization due -     Flu vaccine trivalent PF, 6mos and older(Flulaval,Afluria,Fluarix,Fluzone) -     Pneumococcal conjugate vaccine 20-valent  Abnormal liver function Orders: -     Hepatitis B surface antibody,qualitative; Future -     Hepatitis B surface antigen; Future -     Hepatic function panel; Future  Return in about 6 months (around 03/04/2025) for CPE, Labs.  Daelin Haste, MD [facility]. [place] office.      [1]  Current Outpatient Medications on File Prior to Visit  Medication Sig Dispense Refill   amLODipine  (NORVASC ) 5 MG tablet TAKE 1 TABLET BY MOUTH EVERY NIGHT AT BEDTIME 90 tablet 0   busPIRone  (BUSPAR ) 5 MG tablet TAKE 1 TABLET BY MOUTH EVERY MORNING AND  TAKE 2 TABLETS BY MOUTH EVERY EVENING 90 tablet 5   clopidogrel  (PLAVIX ) 75 MG tablet TAKE 1 TABLET BY MOUTH DAILY 90 tablet 1   Multiple Vitamins-Minerals (MULTIVITAMIN ADULTS) TABS Take 1 tablet by mouth daily.     rosuvastatin  (CRESTOR ) 40 MG tablet Take 1 tablet (40 mg total) by mouth daily. 90 tablet 2   No current facility-administered medications on file prior to visit.  [2] No Known Allergies  "

## 2024-09-07 NOTE — Patient Instructions (Addendum)
 A few things to remember from today's visit:  Urinary incontinence, urge - Plan: oxybutynin  (DITROPAN  XL) 10 MG 24 hr tablet  Essential hypertension, benign - Plan: Basic metabolic panel with GFR, lisinopril  (ZESTRIL ) 40 MG tablet  Hyperlipidemia, unspecified hyperlipidemia type - Plan: Hepatic function panel, Lipid panel  Elevated alkaline phosphatase level - Plan: Hepatitis B surface antibody,qualitative, Hepatitis B surface antigen, Hepatic function panel  Today Lisinopril  dose increased to 40 mg and Oxybutynin  increased from 5 mg to 10 mg for urinary symptoms. Monitor blood pressure. Continue kegel exercises.  If you need refills for medications you take chronically, please call your pharmacy. Do not use My Chart to request refills or for acute issues that need immediate attention. If you send a my chart message, it may take a few days to be addressed, specially if I am not in the office.  Please be sure medication list is accurate. If a new problem present, please set up appointment sooner than planned today.

## 2024-09-08 LAB — HEPATITIS B SURFACE ANTIGEN: Hepatitis B Surface Ag: NONREACTIVE

## 2024-09-08 LAB — HEPATITIS B SURFACE ANTIBODY,QUALITATIVE: Hep B S Ab: NONREACTIVE

## 2024-09-15 ENCOUNTER — Encounter: Payer: Self-pay | Admitting: Family Medicine

## 2024-09-15 DIAGNOSIS — R945 Abnormal results of liver function studies: Secondary | ICD-10-CM | POA: Insufficient documentation

## 2024-09-20 NOTE — Telephone Encounter (Signed)
 Patient informed of her lab results. Patient voiced understanding. She stated she wanted to wait till her next office visit to receive her Hep B vaccine.

## 2024-10-18 ENCOUNTER — Other Ambulatory Visit: Payer: Self-pay | Admitting: Family Medicine

## 2024-10-18 DIAGNOSIS — E785 Hyperlipidemia, unspecified: Secondary | ICD-10-CM

## 2024-10-18 DIAGNOSIS — I10 Essential (primary) hypertension: Secondary | ICD-10-CM

## 2025-03-08 ENCOUNTER — Encounter: Admitting: Family Medicine
